# Patient Record
Sex: Female | Born: 1945
Health system: Southern US, Community
[De-identification: ages and names within clinical notes are randomized; demographics above are authoritative.]

## PROBLEM LIST (undated history)

## (undated) DIAGNOSIS — K589 Irritable bowel syndrome without diarrhea: Secondary | ICD-10-CM

## (undated) DIAGNOSIS — M1990 Unspecified osteoarthritis, unspecified site: Secondary | ICD-10-CM

## (undated) DIAGNOSIS — K5790 Diverticulosis of intestine, part unspecified, without perforation or abscess without bleeding: Secondary | ICD-10-CM

## (undated) DIAGNOSIS — Z87442 Personal history of urinary calculi: Secondary | ICD-10-CM

## (undated) DIAGNOSIS — I8390 Asymptomatic varicose veins of unspecified lower extremity: Secondary | ICD-10-CM

## (undated) DIAGNOSIS — Z9889 Other specified postprocedural states: Secondary | ICD-10-CM

## (undated) DIAGNOSIS — D649 Anemia, unspecified: Secondary | ICD-10-CM

## (undated) DIAGNOSIS — R112 Nausea with vomiting, unspecified: Secondary | ICD-10-CM

## (undated) DIAGNOSIS — H269 Unspecified cataract: Secondary | ICD-10-CM

## (undated) DIAGNOSIS — M25619 Stiffness of unspecified shoulder, not elsewhere classified: Secondary | ICD-10-CM

## (undated) DIAGNOSIS — K635 Polyp of colon: Secondary | ICD-10-CM

## (undated) DIAGNOSIS — I10 Essential (primary) hypertension: Secondary | ICD-10-CM

## (undated) HISTORY — DX: Polyp of colon: K63.5

## (undated) HISTORY — PX: EYE SURGERY: SHX253

## (undated) HISTORY — DX: Diverticulosis of intestine, part unspecified, without perforation or abscess without bleeding: K57.90

## (undated) HISTORY — PX: JOINT REPLACEMENT: SHX530

## (undated) HISTORY — DX: Unspecified cataract: H26.9

## (undated) HISTORY — PX: FOOT SURGERY: SHX648

---

## 1999-06-08 ENCOUNTER — Other Ambulatory Visit: Admission: RE | Admit: 1999-06-08 | Discharge: 1999-06-08 | Payer: Self-pay | Admitting: Obstetrics & Gynecology

## 2001-06-06 ENCOUNTER — Encounter: Payer: Self-pay | Admitting: *Deleted

## 2001-06-06 ENCOUNTER — Emergency Department (HOSPITAL_COMMUNITY): Admission: EM | Admit: 2001-06-06 | Discharge: 2001-06-06 | Payer: Self-pay | Admitting: *Deleted

## 2001-06-23 ENCOUNTER — Ambulatory Visit (HOSPITAL_COMMUNITY): Admission: RE | Admit: 2001-06-23 | Discharge: 2001-06-23 | Payer: Self-pay | Admitting: Pulmonary Disease

## 2003-07-12 ENCOUNTER — Other Ambulatory Visit: Admission: RE | Admit: 2003-07-12 | Discharge: 2003-07-12 | Payer: Self-pay | Admitting: Obstetrics & Gynecology

## 2003-12-09 ENCOUNTER — Ambulatory Visit (HOSPITAL_COMMUNITY): Admission: RE | Admit: 2003-12-09 | Discharge: 2003-12-09 | Payer: Self-pay | Admitting: Internal Medicine

## 2003-12-09 LAB — HM COLONOSCOPY

## 2004-09-25 ENCOUNTER — Ambulatory Visit (HOSPITAL_COMMUNITY): Admission: RE | Admit: 2004-09-25 | Discharge: 2004-09-25 | Payer: Self-pay | Admitting: Preventative Medicine

## 2004-09-27 ENCOUNTER — Encounter: Admission: RE | Admit: 2004-09-27 | Discharge: 2004-09-27 | Payer: Self-pay | Admitting: Preventative Medicine

## 2004-10-19 ENCOUNTER — Encounter: Admission: RE | Admit: 2004-10-19 | Discharge: 2005-01-17 | Payer: Self-pay | Admitting: Neurology

## 2005-12-11 ENCOUNTER — Ambulatory Visit (HOSPITAL_COMMUNITY): Admission: RE | Admit: 2005-12-11 | Discharge: 2005-12-11 | Payer: Self-pay | Admitting: Pulmonary Disease

## 2005-12-16 ENCOUNTER — Emergency Department (HOSPITAL_COMMUNITY): Admission: EM | Admit: 2005-12-16 | Discharge: 2005-12-16 | Payer: Self-pay | Admitting: Emergency Medicine

## 2008-12-09 ENCOUNTER — Telehealth (INDEPENDENT_AMBULATORY_CARE_PROVIDER_SITE_OTHER): Payer: Self-pay | Admitting: *Deleted

## 2010-02-08 ENCOUNTER — Encounter (INDEPENDENT_AMBULATORY_CARE_PROVIDER_SITE_OTHER): Payer: Self-pay | Admitting: Obstetrics and Gynecology

## 2010-02-08 ENCOUNTER — Inpatient Hospital Stay (HOSPITAL_COMMUNITY): Admission: RE | Admit: 2010-02-08 | Discharge: 2010-02-11 | Payer: Self-pay | Admitting: Obstetrics and Gynecology

## 2010-02-09 HISTORY — PX: ABDOMINAL HYSTERECTOMY: SHX81

## 2010-11-20 LAB — URINALYSIS, ROUTINE W REFLEX MICROSCOPIC
Bilirubin Urine: NEGATIVE
Ketones, ur: NEGATIVE mg/dL
Nitrite: NEGATIVE
Specific Gravity, Urine: >1.03 — ABNORMAL HIGH (ref 1.005–1.030)

## 2010-11-20 LAB — PREGNANCY, URINE: Preg Test, Ur: NEGATIVE

## 2010-11-20 LAB — COMPREHENSIVE METABOLIC PANEL
ALT: 15 U/L (ref 0–35)
AST: 18 U/L (ref 0–37)
Alkaline Phosphatase: 78 U/L (ref 39–117)
BUN: 15 mg/dL (ref 6–23)
CO2: 29 mEq/L (ref 19–32)
Creatinine, Ser: 0.7 mg/dL (ref 0.4–1.2)
Glucose, Bld: 91 mg/dL (ref 70–99)
Potassium: 3.6 mEq/L (ref 3.5–5.1)

## 2010-11-20 LAB — PROTIME-INR: Prothrombin Time: 13.9 seconds (ref 11.6–15.2)

## 2010-11-20 LAB — CBC
Hemoglobin: 11 g/dL — ABNORMAL LOW (ref 12.0–15.0)
MCHC: 34.4 g/dL (ref 30.0–36.0)
Platelets: 220 10*3/uL (ref 150–400)
RBC: 4.61 MIL/uL (ref 3.87–5.11)
RDW: 14.7 % (ref 11.5–15.5)
RDW: 14.7 % (ref 11.5–15.5)
WBC: 6.1 10*3/uL (ref 4.0–10.5)

## 2010-11-20 LAB — HEMOGLOBIN AND HEMATOCRIT, BLOOD: HCT: 35.1 % — ABNORMAL LOW (ref 36.0–46.0)

## 2011-01-19 NOTE — Op Note (Signed)
NAMELACONYA, CLERE                          ACCOUNT NO.:  [id number]   MEDICAL RECORD NO.:  [id number]                   PATIENT TYPE:  AMB   LOCATION:  DAY                                  FACILITY:  [facility]   PHYSICIAN:  R. Roetta Sessions, M.D.              DATE OF BIRTH:  1946-02-26   DATE OF PROCEDURE:  DATE OF DISCHARGE:                                  PROCEDURE NOTE   INDICATION FOR PROCEDURE:  Pain.   HISTORY OF PRESENT ILLNESS:  This is a 65 year old lady who comes for  screening colonoscopy.  She has intermittent alternating constipation and  diarrhea consistent with irritable bowel syndrome.  No rectal bleeding.  She  has never had a complete colonoscopy although she had a sigmoidoscopy in  1998.  Colonoscopy is now being done.  The procedure was discussed with the  patient at risk, the potential risks, benefits and alternatives have been  reviewed, and questions answered.  She is agreeable.  Please see my dictated  H&P.   PROCEDURE NOTE:  O2 saturation, blood pressure, pulse, and respirations were  monitored throughout the entire procedure.  Conscious sedation with Versed 4  mg IV, and Demerol 75 mg IV in divided doses.   INSTRUMENT:  Instrument was Olympus CF-140 colonoscope.   FINDINGS:  Digital rectal exam revealed no abnormalities.   ENDOSCOPIC FINDINGS:  Prep was good.   RECTUM:  Examination of rectal mucosa, including retroflexed view of the  anal verge, revealed two anal internal hemorrhoids; otherwise, rectal mucosa  appeared normal.   COLON:  Colonic mucosa was surveyed from the rectosigmoid junction to the  left transverse right colon to the area of the appendiceal orifice,  ileocecal valve and cecum.  These structures were well seen and photographed  for the record.  From ___________,  the scope was slowly withdrawn.  All  previously mentioned mucosal surfaces were again seen.   The patient was noted to have sigmoid diverticula and a 0.7 cm  pedunculated  polyp at the splenic flexure, which was cold snared and recovered through  the scope.  The remainder of the colonic mucosa appeared normal.   The patient tolerated the procedure well throughout the endoscopy.   IMPRESSION:  1. Anal papillae (2), and internal hemorrhoids; otherwise normal rectum.  2. Sigmoid diverticula, pedunculated polyp at the splenic flexure, cold     snare, remainder of the colonic mucosa appeared normal.   RECOMMENDATIONS:  1. No aspirin or arthritis medications for the next 10 days.  2. Diverticulosis literature provided to Ms. Effinger.  3. She should begin a fiber supplement in the way of Fiber Choice or     Benefiber daily.  4. Further recommendations pending review of pathology.      ___________________________________________  Jonathon Bellows, M.D.   RMR/MEDQ  D:  12/09/2003  T:  12/10/2003  Job:  [id number]   cc:   Ramon Dredge L. Juanetta Gosling, M.D.  [address]  [place]  [place] [address]  Fax: [phone]   Annamaria Helling, Dr.  OB/GYN, [place]

## 2011-01-19 NOTE — H&P (Signed)
NAME:  Carla Schmitt, Carla Schmitt NO.:  [id number]   MEDICAL RECORD NO.:  [id number]                  PATIENT TYPE:   LOCATION:                                       FACILITY:   PHYSICIAN:  R. Roetta Sessions, M.D.              DATE OF BIRTH:  1946/08/22   DATE OF ADMISSION:  DATE OF DISCHARGE:                                HISTORY & PHYSICAL   CHIEF COMPLAINT:  Long history of irritable bowel syndrome, for screening  colonoscopy.   Mrs. Carla Schmitt is a pleasant 65 year old African-American female whom I  last saw back in 1998.  She was having diarrhea-predominant IBS symptoms.  Sigmoidoscopy back in 1998 demonstrated internal hemorrhoids of the sigmoid  ampulla, otherwise normal colon to 60 cm.  She has done very well.  She has  been on a diet under the direction of Dr. Aldona Bar, her gynecologist.  She has  lost 27 pounds recently.  She feels that she needs to have a colonoscopy.  She has not had any rectal bleeding although she has had a tendency towards  constipation in the past couple of years.  No abdominal pain aside from  vague postprandial abdominal cramps from time to time.  No upper GI tract  symptoms such as odynophagia, dysphagia, early satiety, reflux symptoms,  nausea, or vomiting.  There is no family history of colorectal neoplasia.  She was previously on Levbid for diarrhea-predominant symptoms; however, she  has come off that medication some time ago.   PAST MEDICAL HISTORY:  Significant for irritable bowel syndrome,  constipation, fluid retention.   PAST SURGERY:  Right and left foot surgery.   CURRENT MEDICATIONS:  Maxzide two tablets weekly for fluid retention.   ALLERGIES:  VIBRAMYCIN.   FAMILY HISTORY:  No for chronic GI or liver disease.  Father died with  congestive heart failure.  Mother is alive in fairly good health.   SOCIAL HISTORY:  The patient has been married 22 years, has two children.  She is a housewife.  No tobacco, no  alcohol.   REVIEW OF SYSTEMS:  No chest pain, dyspnea, fever, chills.   PHYSICAL EXAMINATION:  GENERAL:  Reveals a pleasant, well-groomed 57-year-  old lady resting comfortably.  VITAL SIGNS:  Weight 226, height 5 feet 3.5 inches.  Blood pressure 130/80,  pulse 84.  SKIN:  Warm and dry;  HEENT:  No scleral icterus.  CHEST:  Lungs are clear to auscultation.  CARDIAC:  Regular rate and rhythm without murmur, gallop, or rub.  BREASTS:  Deferred.  ABDOMEN:  Obese, positive bowel sounds, soft, nontender, without appreciable  mass or organomegaly.  EXTREMITIES:  Trace lower extremity edema.  RECTAL:  Deferred until the time of colonoscopy.   IMPRESSION:  Mrs. Carla Schmitt is a pleasant 65 year old lady with  alternating constipation and diarrhea consistent with irritable bowel  syndrome.  She has never had a full colonoscopy although she had a negative  flexible sigmoidoscopy  to 60 cm in 1998.  I have agreed with Mrs. Carla Schmitt a  colonoscopy should be done largely for screening purposes.  I have discussed  this approach with Mrs. Carla Schmitt.  Potential risks, benefits, and  alternatives have been reviewed, questions answered, she is agreeable.  Plan  to perform colonoscopy in the very near future.  I have also recommended to  her that she start taking a fiber supplement on a daily basis.  I have  recommended specifically Fiber Choice tablets or Benefiber.  Will make  further recommendations after colonoscopy has been performed.     ___________________________________________                                         Jonathon Bellows, M.D.   RMR/MEDQ  D:  12/06/2003  T:  12/06/2003  Job:  [id number]   cc:   Gerrit Friends. Aldona Bar, M.D.  [address], [address]  [place]  [place] [address]  Fax: [phone]   Oneal Deputy. Juanetta Gosling, M.D.  [address]  [place]  [place] [address]  Fax: [phone]

## 2011-02-02 LAB — HM MAMMOGRAPHY

## 2011-02-02 LAB — HM PAP SMEAR

## 2011-06-04 ENCOUNTER — Encounter: Payer: Self-pay | Admitting: Family Medicine

## 2011-06-04 ENCOUNTER — Ambulatory Visit (INDEPENDENT_AMBULATORY_CARE_PROVIDER_SITE_OTHER): Payer: 59 | Admitting: Family Medicine

## 2011-06-04 VITALS — BP 122/92 | HR 80 | Temp 98.7°F | Resp 12 | Ht 63.0 in | Wt 240.0 lb

## 2011-06-04 DIAGNOSIS — E669 Obesity, unspecified: Secondary | ICD-10-CM

## 2011-06-04 DIAGNOSIS — IMO0002 Reserved for concepts with insufficient information to code with codable children: Secondary | ICD-10-CM

## 2011-06-04 DIAGNOSIS — Z23 Encounter for immunization: Secondary | ICD-10-CM

## 2011-06-04 DIAGNOSIS — M1710 Unilateral primary osteoarthritis, unspecified knee: Secondary | ICD-10-CM

## 2011-06-04 MED ORDER — NAPROXEN 500 MG PO TABS: 500.0000 mg | ORAL_TABLET | Freq: Two times a day (BID) | ORAL | Status: DC

## 2011-06-04 NOTE — Progress Notes (Signed)
  Subjective:    Patient ID: Carla Schmitt, female    DOB: May 09, 1946, 65 y.o.   MRN: [id number]  HPI Patient is seen new to establish care.  Has previously seen primary care physician in [place]. She has history of seasonal allergies, obesity, and osteoarthritis involving both knees. She's been told previously several years ago that she had bone against bone osteoarthritis and would need total knee replacements.  Very limited in physical activity because of arthritis issues. Requesting refill of naproxen 500 mg which she takes occasionally for pain.  Hx of seasonal allergies controlled with OTC meds.   Obesity but no hx of diabetes.   Currently not on any weight loss programs.  Patient nonsmoker. No alcohol use.  Old records pending.  PMH and surgical hx as below.  Past Medical History  Diagnosis Date  . Anxiety   . Bronchitis   . Headache   . UTI (urinary tract infection)   . Colon polyps   . Chronic kidney disease     stones   Past Surgical History  Procedure Date  . Abdominal hysterectomy 2011    TAH  . Foot surgery     reports that she quit smoking about 20 years ago. Her smoking use included Cigarettes. She has a 18 pack-year smoking history. She does not have any smokeless tobacco history on file. Her alcohol and drug histories not on file. family history includes Arthritis in her other; Heart disease in her other; and Stroke in her other. Allergies  Allergen Reactions  . Ceftin     hives      Review of Systems  Constitutional: Negative for fever, appetite change and unexpected weight change.  HENT: Negative for congestion and sinus pressure.   Respiratory: Negative for cough and shortness of breath.   Cardiovascular: Negative for chest pain.  Gastrointestinal: Negative for abdominal pain and blood in stool.  Genitourinary: Negative for dysuria.  Musculoskeletal: Positive for arthralgias. Negative for back pain and joint swelling.  Neurological: Negative for  dizziness and headaches.  Hematological: Negative for adenopathy.  Psychiatric/Behavioral: Negative for dysphoric mood.       Objective:   Physical Exam  Constitutional: She is oriented to person, place, and time.  HENT:  Right Ear: External ear normal.  Left Ear: External ear normal.  Mouth/Throat: Oropharynx is clear and moist.  Neck: Neck supple. No thyromegaly present.  Cardiovascular: Normal rate, regular rhythm and normal heart sounds.   Pulmonary/Chest: Effort normal and breath sounds normal. No respiratory distress. She has no wheezes. She has no rales.  Musculoskeletal: She exhibits no edema.  Lymphadenopathy:    She has no cervical adenopathy.  Neurological: She is alert and oriented to person, place, and time. No cranial nerve deficit.  Psychiatric: She has a normal mood and affect. Her behavior is normal.          Assessment & Plan:  #1 morbid obesity. Discussed importance of weight loss. Discussed alternative exercises with osteoarthritis. Schedule complete physical #2 osteoarthritis both knees. Patient requesting orthopedic referral and we'll set up #3 history of seasonal allergies  #4 health maintenance. Flu vaccine recommended and given

## 2011-06-04 NOTE — Patient Instructions (Signed)
Schedule complete physical at your convenience.   

## 2011-08-14 ENCOUNTER — Other Ambulatory Visit: Payer: Self-pay | Admitting: Orthopedic Surgery

## 2011-09-20 ENCOUNTER — Other Ambulatory Visit (INDEPENDENT_AMBULATORY_CARE_PROVIDER_SITE_OTHER): Payer: 59

## 2011-09-20 DIAGNOSIS — Z0000 Encounter for general adult medical examination without abnormal findings: Secondary | ICD-10-CM

## 2011-09-20 LAB — CBC WITH DIFFERENTIAL/PLATELET
Basophils Absolute: 0 10*3/uL (ref 0.0–0.1)
Basophils Relative: 0.4 % (ref 0.0–3.0)
Eosinophils Absolute: 0.3 10*3/uL (ref 0.0–0.7)
Eosinophils Relative: 4.7 % (ref 0.0–5.0)
Lymphocytes Relative: 27 % (ref 12.0–46.0)
Lymphs Abs: 1.8 10*3/uL (ref 0.7–4.0)
MCV: 85.3 fl (ref 78.0–100.0)
Monocytes Relative: 4 % (ref 3.0–12.0)
Neutrophils Relative %: 63.9 % (ref 43.0–77.0)
Platelets: 265 10*3/uL (ref 150.0–400.0)
RBC: 4.38 Mil/uL (ref 3.87–5.11)
RDW: 15.1 % — ABNORMAL HIGH (ref 11.5–14.6)

## 2011-09-20 LAB — POCT URINALYSIS DIPSTICK
Protein, UA: NEGATIVE
Urobilinogen, UA: 0.2
pH, UA: 5

## 2011-09-20 LAB — HEPATIC FUNCTION PANEL
Alkaline Phosphatase: 72 U/L (ref 39–117)
Total Bilirubin: 0.3 mg/dL (ref 0.3–1.2)
Total Protein: 7.3 g/dL (ref 6.0–8.3)

## 2011-09-20 LAB — LIPID PANEL
HDL: 60.9 mg/dL (ref >39.00)
LDL Cholesterol: 92 mg/dL (ref 0–99)
VLDL: 23.2 mg/dL (ref 0.0–40.0)

## 2011-09-20 LAB — BASIC METABOLIC PANEL
BUN: 24 mg/dL — ABNORMAL HIGH (ref 6–23)
GFR: 108.51 mL/min (ref >60.00)
Potassium: 3.5 mEq/L (ref 3.5–5.1)

## 2011-09-27 ENCOUNTER — Ambulatory Visit (INDEPENDENT_AMBULATORY_CARE_PROVIDER_SITE_OTHER): Payer: 59 | Admitting: Family Medicine

## 2011-09-27 ENCOUNTER — Encounter: Payer: Self-pay | Admitting: Family Medicine

## 2011-09-27 VITALS — BP 160/84 | HR 80 | Temp 98.4°F | Resp 12 | Ht 63.0 in | Wt 238.0 lb

## 2011-09-27 DIAGNOSIS — I10 Essential (primary) hypertension: Secondary | ICD-10-CM

## 2011-09-27 DIAGNOSIS — Z0000 Encounter for general adult medical examination without abnormal findings: Secondary | ICD-10-CM

## 2011-09-27 MED ORDER — BECLOMETHASONE DIPROP MONOHYD 42 MCG/SPRAY NA SUSP: 2.0000 | Freq: Two times a day (BID) | NASAL | Status: DC

## 2011-09-27 MED ORDER — HYDROCHLOROTHIAZIDE 25 MG PO TABS: ORAL_TABLET | ORAL | Status: DC

## 2011-09-27 NOTE — Patient Instructions (Signed)

## 2011-09-27 NOTE — Progress Notes (Signed)
Subjective:    Patient ID: Carla Schmitt, female    DOB: 10/07/1945, 66 y.o.   MRN: [id number]  HPI  Patient here for complete physical. Past medical history reviewed. Osteoarthritis mostly involving knees. Planning for total knee replacement in March. She has history of seasonal allergies and takes nasal steroids for that. No other prescription medications. Colonoscopy but unsure of date. This was done approximately 10 years ago. She had total abdominal hysterectomy secondary to uterine prolapse. Continues to see gynecologist for mammograms and Pap smears. Last tetanus less than 10 years ago.  History of elevated blood pressure off and on but never treated.  No recent chest pains.  No edema. Past Medical History  Diagnosis Date  . Anxiety   . Bronchitis   . Headache   . UTI (urinary tract infection)   . Colon polyps   . Chronic kidney disease     stones   Past Surgical History  Procedure Date  . Abdominal hysterectomy 2011    TAH  . Foot surgery     reports that she quit smoking about 20 years ago. Her smoking use included Cigarettes. She has a 18 pack-year smoking history. She does not have any smokeless tobacco history on file. Her alcohol and drug histories not on file. family history includes Arthritis in her other; Heart disease in her other; and Stroke in her other. Allergies  Allergen Reactions  . Cefuroxime Axetil     hives      Review of Systems  Constitutional: Negative for fever, activity change, appetite change, fatigue and unexpected weight change.  HENT: Negative for hearing loss, ear pain, sore throat and trouble swallowing.   Eyes: Negative for visual disturbance.  Respiratory: Negative for cough and shortness of breath.   Cardiovascular: Negative for chest pain and palpitations.  Gastrointestinal: Negative for abdominal pain, diarrhea, constipation and blood in stool.  Genitourinary: Negative for dysuria and hematuria.  Musculoskeletal: Positive for  arthralgias (Knees). Negative for myalgias and back pain.  Skin: Negative for rash.  Neurological: Negative for dizziness, syncope and headaches.  Hematological: Negative for adenopathy.  Psychiatric/Behavioral: Negative for confusion and dysphoric mood.       Objective:   Physical Exam  Constitutional: She is oriented to person, place, and time. She appears well-developed and well-nourished.  HENT:  Head: Normocephalic and atraumatic.  Eyes: EOM are normal. Pupils are equal, round, and reactive to light.  Neck: Normal range of motion. Neck supple. No thyromegaly present.  Cardiovascular: Normal rate, regular rhythm and normal heart sounds.   No murmur heard. Pulmonary/Chest: Breath sounds normal. No respiratory distress. She has no wheezes. She has no rales.  Abdominal: Soft. Bowel sounds are normal. She exhibits no distension and no mass. There is no tenderness. There is no rebound and no guarding.  Musculoskeletal: Normal range of motion. She exhibits no edema.  Lymphadenopathy:    She has no cervical adenopathy.  Neurological: She is alert and oriented to person, place, and time. She displays normal reflexes. No cranial nerve deficit.  Skin: No rash noted.  Psychiatric: She has a normal mood and affect. Her behavior is normal. Judgment and thought content normal.          Assessment & Plan:  #1 health maintenance. Confirm date of last colonoscopy. Check on insurance coverage for shingles vaccine. Confirm date of last tetanus. Continue regular GYN followup. Labs reviewed with patient. No major concerning abnormalities #2 hypertension currently untreated. Elevated reading today repeat at rest left  arm seated 180/96. Initiate HCTZ 12.5 mg daily and reassess blood pressure in 2 weeks

## 2011-09-28 DIAGNOSIS — I10 Essential (primary) hypertension: Secondary | ICD-10-CM | POA: Insufficient documentation

## 2011-10-11 ENCOUNTER — Encounter: Payer: Self-pay | Admitting: Family Medicine

## 2011-10-11 ENCOUNTER — Ambulatory Visit (INDEPENDENT_AMBULATORY_CARE_PROVIDER_SITE_OTHER): Payer: 59 | Admitting: Family Medicine

## 2011-10-11 VITALS — BP 170/98 | Temp 98.1°F | Wt 238.0 lb

## 2011-10-11 DIAGNOSIS — I10 Essential (primary) hypertension: Secondary | ICD-10-CM

## 2011-10-11 DIAGNOSIS — Z299 Encounter for prophylactic measures, unspecified: Secondary | ICD-10-CM

## 2011-10-11 MED ORDER — TETANUS-DIPHTH-ACELL PERTUSSIS 5-2.5-18.5 LF-MCG/0.5 IM SUSP: 0.5000 mL | Freq: Once | INTRAMUSCULAR | Status: DC

## 2011-10-11 MED ORDER — ZOSTER VACCINE LIVE 19400 UNT/0.65ML ~~LOC~~ SOLR: 0.6500 mL | Freq: Once | SUBCUTANEOUS | Status: DC

## 2011-10-11 MED ORDER — AMLODIPINE BESYLATE 5 MG PO TABS: 5.0000 mg | ORAL_TABLET | Freq: Every day | ORAL | Status: DC

## 2011-10-11 NOTE — Progress Notes (Signed)
  Subjective:    Patient ID: Carla Schmitt, female    DOB: 1946/06/06, 66 y.o.   MRN: [id number]  HPI  Patient seen for followup hypertension. Initiated HCTZ 12.5 mg daily last visit. No side effects. Not monitoring blood pressure at home. No peripheral edema issues. No dizziness. No chest pains. No headaches. Compliant with therapy. She is recently tried to reduce salt intake   Review of Systems  Constitutional: Negative for fatigue.  Eyes: Negative for visual disturbance.  Respiratory: Negative for cough, chest tightness, shortness of breath and wheezing.   Cardiovascular: Negative for chest pain, palpitations and leg swelling.  Neurological: Negative for dizziness, seizures, syncope, weakness, light-headedness and headaches.       Objective:   Physical Exam  Constitutional: She appears well-developed and well-nourished.  Cardiovascular: Normal rate and regular rhythm.   Pulmonary/Chest: Effort normal and breath sounds normal. No respiratory distress. She has no wheezes. She has no rales.  Musculoskeletal: She exhibits no edema.          Assessment & Plan:  Hypertension. Still elevated fairly severe range with repeat right and left arm seated after rest 170/98. Add amlodipine 5 mg daily continue HCTZ. Reassess 3 weeks prior to surgery

## 2011-10-26 ENCOUNTER — Encounter (HOSPITAL_COMMUNITY): Payer: Self-pay | Admitting: Pharmacy Technician

## 2011-10-29 ENCOUNTER — Encounter (HOSPITAL_COMMUNITY): Payer: Self-pay

## 2011-10-29 ENCOUNTER — Ambulatory Visit (HOSPITAL_COMMUNITY)
Admission: RE | Admit: 2011-10-29 | Discharge: 2011-10-29 | Disposition: A | Discharge status: Home / Self Care | Payer: 59 | Source: Ambulatory Visit | Attending: Orthopedic Surgery | Admitting: Orthopedic Surgery

## 2011-10-29 ENCOUNTER — Encounter (HOSPITAL_COMMUNITY)
Admission: RE | Admit: 2011-10-29 | Discharge: 2011-10-29 | Disposition: A | Discharge status: Home / Self Care | Payer: 59 | Source: Ambulatory Visit | Attending: Orthopedic Surgery | Admitting: Orthopedic Surgery

## 2011-10-29 ENCOUNTER — Other Ambulatory Visit: Payer: Self-pay

## 2011-10-29 DIAGNOSIS — Z01810 Encounter for preprocedural cardiovascular examination: Secondary | ICD-10-CM | POA: Insufficient documentation

## 2011-10-29 DIAGNOSIS — I10 Essential (primary) hypertension: Secondary | ICD-10-CM | POA: Insufficient documentation

## 2011-10-29 DIAGNOSIS — M5380 Other specified dorsopathies, site unspecified: Secondary | ICD-10-CM | POA: Insufficient documentation

## 2011-10-29 DIAGNOSIS — M1710 Unilateral primary osteoarthritis, unspecified knee: Secondary | ICD-10-CM | POA: Insufficient documentation

## 2011-10-29 DIAGNOSIS — Z01812 Encounter for preprocedural laboratory examination: Secondary | ICD-10-CM | POA: Insufficient documentation

## 2011-10-29 DIAGNOSIS — Z01818 Encounter for other preprocedural examination: Secondary | ICD-10-CM | POA: Insufficient documentation

## 2011-10-29 HISTORY — DX: Essential (primary) hypertension: I10

## 2011-10-29 HISTORY — DX: Asymptomatic varicose veins of unspecified lower extremity: I83.90

## 2011-10-29 HISTORY — DX: Unspecified osteoarthritis, unspecified site: M19.90

## 2011-10-29 HISTORY — DX: Irritable bowel syndrome, unspecified: K58.9

## 2011-10-29 LAB — CBC
HCT: 36.8 % (ref 36.0–46.0)
MCV: 83.1 fL (ref 78.0–100.0)
RDW: 14.7 % (ref 11.5–15.5)
WBC: 7 10*3/uL (ref 4.0–10.5)

## 2011-10-29 LAB — COMPREHENSIVE METABOLIC PANEL
BUN: 17 mg/dL (ref 6–23)
CO2: 30 mEq/L (ref 19–32)
Chloride: 103 mEq/L (ref 96–112)
Creatinine, Ser: 0.66 mg/dL (ref 0.50–1.10)
GFR calc Af Amer: >90 mL/min (ref >90)
GFR calc non Af Amer: >90 mL/min (ref >90)
Total Bilirubin: 0.4 mg/dL (ref 0.3–1.2)

## 2011-10-29 LAB — URINALYSIS, ROUTINE W REFLEX MICROSCOPIC
Bilirubin Urine: NEGATIVE
Ketones, ur: NEGATIVE mg/dL
Nitrite: NEGATIVE
Urobilinogen, UA: 0.2 mg/dL (ref 0.0–1.0)

## 2011-10-29 LAB — PROTIME-INR
INR: 1.03 (ref 0.00–1.49)
Prothrombin Time: 13.7 seconds (ref 11.6–15.2)

## 2011-10-29 NOTE — Patient Instructions (Signed)
20 Carla Schmitt  10/29/2011   Your procedure is scheduled on:  11-05-11  Report to [facility] at  0730 AM.  Call this number if you have problems the morning of surgery: [phone]   Remember:   Do not eat food or drink liquids:After Midnight.  .  Take these medicines the morning of surgery with A SIP OF WATER: amlodipine, beconase nasal spray, systane balance eye drop if needed, [facility]vs nasal mist spray if needed   Do not wear jewelry or make up.  Do not wear lotions, powders, or perfumes.Do not wear deodorant.    Do not bring valuables to the hospital.  Contacts, dentures or bridgework may not be worn into surgery.  Leave suitcase in the car. After surgery it may be brought to your room.  For patients admitted to the hospital, checkout time is 11:00 AM the day of discharge.     Special Instructions: CHG Shower Use Special Wash: 1/2 bottle night before surgery and 1/2 bottle morning of surgery.neck down avoid private area, do niot shave for 2 days before showers   Please read over the following fact sheets that you were given: MRSA Information, blood fact sheet  [facility] pre op nurse phone number [phone], call if needed

## 2011-10-31 ENCOUNTER — Other Ambulatory Visit: Payer: Self-pay | Admitting: Orthopedic Surgery

## 2011-11-01 ENCOUNTER — Encounter: Payer: Self-pay | Admitting: Family Medicine

## 2011-11-01 ENCOUNTER — Ambulatory Visit (INDEPENDENT_AMBULATORY_CARE_PROVIDER_SITE_OTHER): Payer: 59 | Admitting: Family Medicine

## 2011-11-01 VITALS — BP 150/82 | Temp 98.2°F | Wt 235.0 lb

## 2011-11-01 DIAGNOSIS — I10 Essential (primary) hypertension: Secondary | ICD-10-CM

## 2011-11-01 NOTE — Progress Notes (Signed)
  Subjective:    Patient ID: Carla Schmitt, female    DOB: March 03, 1946, 66 y.o.   MRN: [id number]  HPI  Patient seen for followup hypertension. Scheduled for knee surgery fourth of March. Added amlodipine last visit to her HCTZ. Blood pressure has improved. BP 170 systolic last visit today 150. No side effects medication. Compliant with medication.   Review of Systems  Constitutional: Negative for fatigue.  Eyes: Negative for visual disturbance.  Respiratory: Negative for cough, chest tightness, shortness of breath and wheezing.   Cardiovascular: Negative for chest pain, palpitations and leg swelling.  Neurological: Negative for dizziness, seizures, syncope, weakness, light-headedness and headaches.       Objective:   Physical Exam  Constitutional: She appears well-developed and well-nourished.  Cardiovascular: Normal rate and regular rhythm.   Pulmonary/Chest: Effort normal and breath sounds normal. No respiratory distress. She has no wheezes. She has no rales.  Musculoskeletal: She exhibits no edema.          Assessment & Plan:  Hypertension. Improved. Continue close monitoring. Patient will schedule followup in 2 months to reassess after her surgery. Consider titration of amlodipine at that point if not further to goal

## 2011-11-01 NOTE — Patient Instructions (Signed)
Continue weight loss efforts.

## 2011-11-01 NOTE — Pre-Procedure Instructions (Signed)
Medical clearance note dr Caryl Never 11-01-11 on chart

## 2011-11-04 ENCOUNTER — Other Ambulatory Visit: Payer: Self-pay | Admitting: Orthopedic Surgery

## 2011-11-04 NOTE — H&P (Signed)
Carla Schmitt  DOB: Sep 24, 1945 Married / Language: English / Race: Black or African American / Female  Date of Admission: 11/05/2011  Chief Complaint:  Left Knee Pain  History of Present Illness  The patient is a 66 year old female who comes in  for a preoperative History and Physical. The patient is scheduled for a left total knee arthroplasty to be performed by Dr. Gus Minkoff. Aluisio, MD at [facility] on 11/05/2011. The patient is a 66 year old female who presents with knee complaints. The patient is seen in referral from Dr. Evelena Peat. The patient reports left knee and right knee symptoms including: pain, swelling, instability, giving way and stiffness which began 30 year(s) ago without any known injury. Carla Schmitt states that both knees hurt at all times. The left knee is somewhat more symptomatic than the right. She has had problems in the knee for many years and it has gotten progressively worse in the past couple of years. She is at a stage where the knees have essentially taken over everything she can and can not do. She has had injections in the past without much benefit. She has had cortisone but no visco supplements. She can no longer walk. The knees are hurting her day and night. They are affecting her ability to do any activities of daily living. She would like to proceed with surgery. They have been treated conservatively in the past for the above stated problem and despite conservative measures, they continue to have progressive pain and severe functional limitations and dysfunction. They have failed non-operative management. It is felt that they would benefit from undergoing total joint replacement. Risks and benefits of the procedure have been discussed with the patient and they elect to proceed with surgery. There are no active contraindications to surgery such as ongoing infection or rapidly progressive neurological disease.  Allergies  Ceftin  *CEPHALOSPORINS*. Rash.  Problem List/Past Medical  Varicose veins Irritable bowel syndrome Osteoarthritis, Knee (715.96)  Family History  Father. Deceased, Congestive Heart Failure, Hypertension. age 80's Mother. Deceased. age 26, History of Pneumonia  Social History  Marital status. Married. Tobacco use. Former smoker. Alcohol use. Never consumed alcohol. Living situation. Lives with spouse. Post-Surgical Plans. Plan to go home  Medication History  Naproxen (500MG  Tablet, Oral as needed) Active. Beconase AQ (42MCG/SPRAY Suspension, Nasal) Active. Gabapentin ( Oral) Specific dose unknown - Active. Vitamin D ( Oral) Specific dose unknown - Active.  Past Surgical History  Hysterectomy. Date: 02/2010. partial (non-cancerous) Foot Surgery. bilateral Colon Polyp Removal - Colonoscopy  Review of Systems  General:Present- Night Sweats. Not Present- Chills, Fever, Fatigue, Weight Gain, Weight Loss and Memory Loss. Skin:Not Present- Hives, Itching, Rash, Eczema and Lesions. HEENT:Present- Tinnitus. Not Present- Headache, Double Vision, Visual Loss, Hearing Loss and Dentures. Respiratory:Not Present- Shortness of breath with exertion, Shortness of breath at rest, Allergies, Coughing up blood and Chronic Cough. Cardiovascular:Not Present- Chest Pain, Racing/skipping heartbeats, Difficulty Breathing Lying Down, Murmur, Swelling and Palpitations. Gastrointestinal:Not Present- Bloody Stool, Heartburn, Abdominal Pain, Vomiting, Nausea, Constipation, Diarrhea, Difficulty Swallowing, Jaundice and Loss of appetitie. Female Genitourinary:Present- Urinating at Night. Not Present- Blood in Urine, Urinary frequency, Weak urinary stream, Discharge, Flank Pain, Incontinence, Painful Urination, Urgency and Urinary Retention. Musculoskeletal:Present- Muscle Weakness, Muscle Pain, Joint Swelling, Joint Pain and Morning Stiffness. Not Present- Back Pain and Spasms. Neurological:Not  Present- Tremor, Dizziness, Blackout spells, Paralysis, Difficulty with balance and Weakness. Psychiatric:Not Present- Insomnia.  Vitals Weight: 63.5 lb Height: 239 in Body Surface Area:  2.2 m Body Mass Index: 0.78 kg/m Pulse: 68 (Regular) Resp.: 14 (Unlabored) BP: 128/82 (Sitting, Left Arm, Standard)   Physical Exam The physical exam findings are as follows:  Patient is a 66 year old female with continued left knee pain.  General Mental Status - Alert, cooperative and good historian. General Appearance- pleasant. Not in acute distress. Orientation- Oriented X3. Build & Nutrition- Overweight, Obese (hip and thigh) and Well developed.  Head and Neck Head- normocephalic, atraumatic . Neck Global Assessment- supple. no bruit auscultated on the right and no bruit auscultated on the left.  ENT Pupil- Bilateral- Regular and Round. Note: reading glasses at times Motion- Bilateral- EOMI.  Upper and lower dentures  Chest and Lung Exam Auscultation: Breath sounds:- clear at anterior chest wall and - clear at posterior chest wall. Adventitious sounds:- No Adventitious sounds.  Cardiovascular Auscultation:Rhythm- Regular rate and rhythm. Heart Sounds- S1 WNL and S2 WNL. Murmurs & Other Heart Sounds:Auscultation of the heart reveals - No Murmurs.  Abdomen Palpation/Percussion:Tenderness- Abdomen is non-tender to palpation. Rigidity (guarding)- Abdomen is soft. Auscultation:Auscultation of the abdomen reveals - Bowel sounds normal.  Female Genitourinary Not done, not pertinent to present illness  Musculoskeletal Well developed female alert and oriented in no apparent distress. Evaluation of her hips show normal range of motion with no discomfort. Her left knee shows no effusion. Range is about 10 to 110. There is marked crepitus on range of motion of the knee. She is tender medial greater than lateral. There is no instability noted. Right  knee range 5 to 110, moderate crepitus on range of motion. Tender medial greater than lateral with no instability noted. Pulses are intact distally with intact sensation and motor function.  RADIOGRAPHS: AP both knees and lateral show severe advanced endstage arthritis of both knees. She has severe bone on bone all three compartments worse medial and patellofemoral with significant varus deformity, significant bony erosions and tibial subluxation with cyst formation.  Assessment & Plan  Osteoarthritis Left Knee  Patient is for a Left Total Knee Replacement by Dr. Lequita Halt.  Plan is to go home after the hospital stay.  PCP- Dr. Smitty Cords Burchett GYN - Dr. Penelope Galas, PA-C

## 2011-11-05 ENCOUNTER — Inpatient Hospital Stay (HOSPITAL_COMMUNITY)
Admission: RE | Admit: 2011-11-05 | Discharge: 2011-11-08 | DRG: 470 | Disposition: A | Discharge status: Home Health | Payer: 59 | Source: Ambulatory Visit | Attending: Orthopedic Surgery | Admitting: Orthopedic Surgery

## 2011-11-05 ENCOUNTER — Encounter (HOSPITAL_COMMUNITY)
Admission: RE | Disposition: A | Discharge status: Home Health | Payer: Self-pay | Source: Ambulatory Visit | Attending: Orthopedic Surgery

## 2011-11-05 ENCOUNTER — Inpatient Hospital Stay (HOSPITAL_COMMUNITY): Payer: 59 | Admitting: *Deleted

## 2011-11-05 ENCOUNTER — Encounter (HOSPITAL_COMMUNITY): Payer: Self-pay | Admitting: *Deleted

## 2011-11-05 DIAGNOSIS — E876 Hypokalemia: Secondary | ICD-10-CM | POA: Diagnosis not present

## 2011-11-05 DIAGNOSIS — I10 Essential (primary) hypertension: Secondary | ICD-10-CM | POA: Diagnosis present

## 2011-11-05 DIAGNOSIS — M1710 Unilateral primary osteoarthritis, unspecified knee: Principal | ICD-10-CM | POA: Diagnosis present

## 2011-11-05 DIAGNOSIS — Z96659 Presence of unspecified artificial knee joint: Secondary | ICD-10-CM

## 2011-11-05 DIAGNOSIS — D62 Acute posthemorrhagic anemia: Secondary | ICD-10-CM | POA: Diagnosis not present

## 2011-11-05 DIAGNOSIS — E871 Hypo-osmolality and hyponatremia: Secondary | ICD-10-CM | POA: Diagnosis not present

## 2011-11-05 DIAGNOSIS — F411 Generalized anxiety disorder: Secondary | ICD-10-CM | POA: Diagnosis present

## 2011-11-05 HISTORY — PX: TOTAL KNEE ARTHROPLASTY: SHX125

## 2011-11-05 LAB — ABO/RH: ABO/RH(D): O POS

## 2011-11-05 SURGERY — ARTHROPLASTY, KNEE, TOTAL
Anesthesia: Spinal | Site: Knee | Laterality: Left | Wound class: Clean

## 2011-11-05 MED ORDER — SODIUM CHLORIDE 0.9 % IV SOLN: INTRAVENOUS | Status: DC

## 2011-11-05 MED ORDER — PROPOFOL 10 MG/ML IV EMUL
INTRAVENOUS | Status: DC | PRN
  Administered 2011-11-05: 50 ug/kg/min via INTRAVENOUS

## 2011-11-05 MED ORDER — ACETAMINOPHEN 10 MG/ML IV SOLN
1000.0000 mg | Freq: Four times a day (QID) | INTRAVENOUS | Status: AC
  Administered 2011-11-05 – 2011-11-06 (×4): 1000 mg via INTRAVENOUS
  Filled 2011-11-05 (×5): qty 100

## 2011-11-05 MED ORDER — PROPYLENE GLYCOL 0.6 % OP SOLN: 2.0000 [drp] | Freq: Two times a day (BID) | OPHTHALMIC | Status: DC | PRN

## 2011-11-05 MED ORDER — MEPERIDINE HCL 50 MG/ML IJ SOLN: 6.2500 mg | INTRAMUSCULAR | Status: DC | PRN

## 2011-11-05 MED ORDER — METOCLOPRAMIDE HCL 10 MG PO TABS: 5.0000 mg | ORAL_TABLET | Freq: Three times a day (TID) | ORAL | Status: DC | PRN

## 2011-11-05 MED ORDER — ONDANSETRON HCL 4 MG/2ML IJ SOLN
4.0000 mg | Freq: Four times a day (QID) | INTRAMUSCULAR | Status: DC | PRN
  Filled 2011-11-05 (×2): qty 2

## 2011-11-05 MED ORDER — FLEET ENEMA 7-19 GM/118ML RE ENEM: 1.0000 | ENEMA | Freq: Once | RECTAL | Status: AC | PRN

## 2011-11-05 MED ORDER — FENTANYL CITRATE 0.05 MG/ML IJ SOLN
INTRAMUSCULAR | Status: DC | PRN
  Administered 2011-11-05: 100 ug via INTRAVENOUS

## 2011-11-05 MED ORDER — DIPHENHYDRAMINE HCL 50 MG/ML IJ SOLN: 12.5000 mg | Freq: Four times a day (QID) | INTRAMUSCULAR | Status: DC | PRN

## 2011-11-05 MED ORDER — MIDAZOLAM HCL 5 MG/5ML IJ SOLN
INTRAMUSCULAR | Status: DC | PRN
  Administered 2011-11-05: 2 mg via INTRAVENOUS

## 2011-11-05 MED ORDER — DIPHENHYDRAMINE HCL 12.5 MG/5ML PO ELIX: 12.5000 mg | ORAL_SOLUTION | ORAL | Status: DC | PRN

## 2011-11-05 MED ORDER — NALOXONE HCL 0.4 MG/ML IJ SOLN: 0.4000 mg | INTRAMUSCULAR | Status: DC | PRN

## 2011-11-05 MED ORDER — OXYMETAZOLINE HCL 0.05 % NA SOLN
2.0000 | NASAL | Status: DC | PRN
  Filled 2011-11-05: qty 15

## 2011-11-05 MED ORDER — VANCOMYCIN HCL 1000 MG IV SOLR
1500.0000 mg | INTRAVENOUS | Status: AC
  Administered 2011-11-05: 1500 mg via INTRAVENOUS
  Filled 2011-11-05: qty 1500

## 2011-11-05 MED ORDER — HYDROCHLOROTHIAZIDE 12.5 MG PO CAPS
12.5000 mg | ORAL_CAPSULE | Freq: Every day | ORAL | Status: DC
  Administered 2011-11-05 – 2011-11-08 (×4): 12.5 mg via ORAL
  Filled 2011-11-05 (×4): qty 1

## 2011-11-05 MED ORDER — DROPERIDOL 2.5 MG/ML IJ SOLN
0.6250 mg | INTRAMUSCULAR | Status: DC | PRN
  Filled 2011-11-05: qty 0.25

## 2011-11-05 MED ORDER — ONDANSETRON HCL 4 MG/2ML IJ SOLN: 4.0000 mg | Freq: Four times a day (QID) | INTRAMUSCULAR | Status: DC | PRN

## 2011-11-05 MED ORDER — TEMAZEPAM 15 MG PO CAPS: 15.0000 mg | ORAL_CAPSULE | Freq: Every evening | ORAL | Status: DC | PRN

## 2011-11-05 MED ORDER — MENTHOL 3 MG MT LOZG
1.0000 | LOZENGE | OROMUCOSAL | Status: DC | PRN
  Filled 2011-11-05: qty 9

## 2011-11-05 MED ORDER — FLUTICASONE PROPIONATE 50 MCG/ACT NA SUSP
2.0000 | Freq: Every day | NASAL | Status: DC
  Filled 2011-11-05: qty 16

## 2011-11-05 MED ORDER — BISACODYL 10 MG RE SUPP: 10.0000 mg | Freq: Every day | RECTAL | Status: DC | PRN

## 2011-11-05 MED ORDER — METHOCARBAMOL 500 MG PO TABS
500.0000 mg | ORAL_TABLET | Freq: Four times a day (QID) | ORAL | Status: DC | PRN
  Administered 2011-11-06 – 2011-11-08 (×7): 500 mg via ORAL
  Filled 2011-11-05 (×7): qty 1

## 2011-11-05 MED ORDER — HYDROMORPHONE HCL PF 1 MG/ML IJ SOLN: 0.2500 mg | INTRAMUSCULAR | Status: DC | PRN

## 2011-11-05 MED ORDER — DEXAMETHASONE SODIUM PHOSPHATE 10 MG/ML IJ SOLN: 10.0000 mg | Freq: Once | INTRAMUSCULAR | Status: DC

## 2011-11-05 MED ORDER — ACETAMINOPHEN 325 MG PO TABS
650.0000 mg | ORAL_TABLET | Freq: Four times a day (QID) | ORAL | Status: DC | PRN
  Administered 2011-11-07 (×2): 650 mg via ORAL
  Filled 2011-11-05 (×2): qty 2

## 2011-11-05 MED ORDER — POLYETHYLENE GLYCOL 3350 17 G PO PACK
17.0000 g | PACK | Freq: Every day | ORAL | Status: DC | PRN
  Filled 2011-11-05: qty 1

## 2011-11-05 MED ORDER — ONDANSETRON HCL 4 MG PO TABS
4.0000 mg | ORAL_TABLET | Freq: Four times a day (QID) | ORAL | Status: DC | PRN
  Administered 2011-11-07: 4 mg via ORAL
  Filled 2011-11-05 (×2): qty 1

## 2011-11-05 MED ORDER — RIVAROXABAN 10 MG PO TABS
10.0000 mg | ORAL_TABLET | Freq: Every day | ORAL | Status: DC
  Administered 2011-11-06 – 2011-11-08 (×3): 10 mg via ORAL
  Filled 2011-11-05 (×3): qty 1

## 2011-11-05 MED ORDER — SODIUM CHLORIDE 0.9 % IJ SOLN: 9.0000 mL | INTRAMUSCULAR | Status: DC | PRN

## 2011-11-05 MED ORDER — LACTATED RINGERS IV SOLN
INTRAVENOUS | Status: DC | PRN
  Administered 2011-11-05 (×2): via INTRAVENOUS

## 2011-11-05 MED ORDER — BUPIVACAINE ON-Q PAIN PUMP (FOR ORDER SET NO CHG)
INJECTION | Status: DC
  Filled 2011-11-05: qty 1

## 2011-11-05 MED ORDER — POLYVINYL ALCOHOL 1.4 % OP SOLN
2.0000 [drp] | Freq: Two times a day (BID) | OPHTHALMIC | Status: DC | PRN
  Filled 2011-11-05: qty 15

## 2011-11-05 MED ORDER — OXYCODONE HCL 5 MG PO TABS
5.0000 mg | ORAL_TABLET | ORAL | Status: DC | PRN
  Administered 2011-11-06 (×2): 10 mg via ORAL
  Administered 2011-11-06: 5 mg via ORAL
  Administered 2011-11-06 – 2011-11-07 (×3): 10 mg via ORAL
  Administered 2011-11-07 (×2): 5 mg via ORAL
  Administered 2011-11-08 (×3): 10 mg via ORAL
  Filled 2011-11-05 (×5): qty 2
  Filled 2011-11-05: qty 1
  Filled 2011-11-05: qty 2
  Filled 2011-11-05: qty 1
  Filled 2011-11-05 (×3): qty 2

## 2011-11-05 MED ORDER — BUPIVACAINE IN DEXTROSE 0.75-8.25 % IT SOLN
INTRATHECAL | Status: DC | PRN
  Administered 2011-11-05: 11.24 mg via INTRATHECAL

## 2011-11-05 MED ORDER — SODIUM CHLORIDE 0.9 % IR SOLN
Status: DC | PRN
  Administered 2011-11-05: 1000 mL

## 2011-11-05 MED ORDER — AMLODIPINE BESYLATE 5 MG PO TABS
5.0000 mg | ORAL_TABLET | Freq: Every morning | ORAL | Status: DC
  Administered 2011-11-06 – 2011-11-08 (×3): 5 mg via ORAL
  Filled 2011-11-05 (×4): qty 1

## 2011-11-05 MED ORDER — VANCOMYCIN HCL IN DEXTROSE 1-5 GM/200ML-% IV SOLN
1000.0000 mg | Freq: Two times a day (BID) | INTRAVENOUS | Status: AC
  Administered 2011-11-05: 1000 mg via INTRAVENOUS
  Filled 2011-11-05: qty 200

## 2011-11-05 MED ORDER — DOCUSATE SODIUM 100 MG PO CAPS
100.0000 mg | ORAL_CAPSULE | Freq: Two times a day (BID) | ORAL | Status: DC
  Administered 2011-11-05 – 2011-11-08 (×6): 100 mg via ORAL
  Filled 2011-11-05 (×6): qty 1

## 2011-11-05 MED ORDER — MORPHINE SULFATE (PF) 1 MG/ML IV SOLN
INTRAVENOUS | Status: DC
  Administered 2011-11-05: 21.69 mg via INTRAVENOUS
  Administered 2011-11-05: 8 mg via INTRAVENOUS
  Administered 2011-11-05: 3 mg via INTRAVENOUS
  Administered 2011-11-05: 12:00:00 via INTRAVENOUS
  Administered 2011-11-06: 3 mg via INTRAVENOUS
  Administered 2011-11-06: 1 mg via INTRAVENOUS
  Administered 2011-11-06: 2 mg via INTRAVENOUS
  Filled 2011-11-05: qty 25

## 2011-11-05 MED ORDER — PHENOL 1.4 % MT LIQD
1.0000 | OROMUCOSAL | Status: DC | PRN
  Filled 2011-11-05: qty 177

## 2011-11-05 MED ORDER — ACETAMINOPHEN 650 MG RE SUPP: 650.0000 mg | Freq: Four times a day (QID) | RECTAL | Status: DC | PRN

## 2011-11-05 MED ORDER — BUPIVACAINE 0.25 % ON-Q PUMP SINGLE CATH 300ML: 300.0000 mL | INJECTION | Status: DC

## 2011-11-05 MED ORDER — HYDROCHLOROTHIAZIDE 25 MG PO TABS
12.5000 mg | ORAL_TABLET | Freq: Every morning | ORAL | Status: DC
  Filled 2011-11-05 (×3): qty 0.5

## 2011-11-05 MED ORDER — ONDANSETRON HCL 4 MG/2ML IJ SOLN
INTRAMUSCULAR | Status: DC | PRN
  Administered 2011-11-05: 4 mg via INTRAVENOUS

## 2011-11-05 MED ORDER — DEXAMETHASONE SODIUM PHOSPHATE 10 MG/ML IJ SOLN
INTRAMUSCULAR | Status: DC | PRN
  Administered 2011-11-05: 10 mg via INTRAVENOUS

## 2011-11-05 MED ORDER — METOCLOPRAMIDE HCL 5 MG/ML IJ SOLN
5.0000 mg | Freq: Three times a day (TID) | INTRAMUSCULAR | Status: DC | PRN
  Filled 2011-11-05: qty 2

## 2011-11-05 MED ORDER — ACETAMINOPHEN 10 MG/ML IV SOLN: 1000.0000 mg | Freq: Once | INTRAVENOUS | Status: DC

## 2011-11-05 MED ORDER — DEXTROSE 5 % IV SOLN
500.0000 mg | Freq: Four times a day (QID) | INTRAVENOUS | Status: DC | PRN
  Filled 2011-11-05: qty 5

## 2011-11-05 MED ORDER — KCL IN DEXTROSE-NACL 20-5-0.45 MEQ/L-%-% IV SOLN
INTRAVENOUS | Status: DC
  Administered 2011-11-05 – 2011-11-06 (×2): via INTRAVENOUS
  Filled 2011-11-05 (×3): qty 1000

## 2011-11-05 MED ORDER — ACETAMINOPHEN 10 MG/ML IV SOLN
INTRAVENOUS | Status: DC | PRN
  Administered 2011-11-05: 1000 mg via INTRAVENOUS

## 2011-11-05 MED ORDER — LACTATED RINGERS IV SOLN: INTRAVENOUS | Status: DC

## 2011-11-05 MED ORDER — BUPIVACAINE 0.25 % ON-Q PUMP SINGLE CATH 300ML
INJECTION | Status: DC | PRN
  Administered 2011-11-05: 300 mL

## 2011-11-05 MED ORDER — DIPHENHYDRAMINE HCL 12.5 MG/5ML PO ELIX
12.5000 mg | ORAL_SOLUTION | Freq: Four times a day (QID) | ORAL | Status: DC | PRN
  Filled 2011-11-05: qty 5

## 2011-11-05 SURGICAL SUPPLY — 52 items
BAG SPEC THK2 15X12 ZIP CLS (MISCELLANEOUS) ×1
BAG ZIPLOCK 12X15 (MISCELLANEOUS) ×2 IMPLANT
BANDAGE ELASTIC 6 VELCRO ST LF (GAUZE/BANDAGES/DRESSINGS) ×2 IMPLANT
BANDAGE ESMARK 6X9 LF (GAUZE/BANDAGES/DRESSINGS) ×1 IMPLANT
BLADE SAG 18X100X1.27 (BLADE) ×2 IMPLANT
BLADE SAW SGTL 11.0X1.19X90.0M (BLADE) ×2 IMPLANT
BNDG CMPR 9X6 STRL LF SNTH (GAUZE/BANDAGES/DRESSINGS) ×1
BNDG ESMARK 6X9 LF (GAUZE/BANDAGES/DRESSINGS) ×2
BOWL SMART MIX CTS (DISPOSABLE) ×2 IMPLANT
CATH KIT ON-Q SILVERSOAK 5 (CATHETERS) ×1 IMPLANT
CATH KIT ON-Q SILVERSOAK 5IN (CATHETERS) ×2 IMPLANT
CEMENT HV SMART SET (Cement) ×4 IMPLANT
CLOTH BEACON ORANGE TIMEOUT ST (SAFETY) ×2 IMPLANT
CUFF TOURN SGL QUICK 34 (TOURNIQUET CUFF) ×2
CUFF TRNQT CYL 34X4X40X1 (TOURNIQUET CUFF) ×1 IMPLANT
DRAPE EXTREMITY T 121X128X90 (DRAPE) ×2 IMPLANT
DRAPE POUCH INSTRU U-SHP 10X18 (DRAPES) ×2 IMPLANT
DRAPE U-SHAPE 47X51 STRL (DRAPES) ×2 IMPLANT
DRSG ADAPTIC 3X8 NADH LF (GAUZE/BANDAGES/DRESSINGS) ×2 IMPLANT
DURAPREP 26ML APPLICATOR (WOUND CARE) ×2 IMPLANT
ELECT REM PT RETURN 9FT ADLT (ELECTROSURGICAL) ×2
ELECTRODE REM PT RTRN 9FT ADLT (ELECTROSURGICAL) ×1 IMPLANT
EVACUATOR 1/8 PVC DRAIN (DRAIN) ×2 IMPLANT
FACESHIELD LNG OPTICON STERILE (SAFETY) ×10 IMPLANT
GLOVE BIO SURGEON STRL SZ7.5 (GLOVE) ×2 IMPLANT
GLOVE BIO SURGEON STRL SZ8 (GLOVE) ×2 IMPLANT
GLOVE BIOGEL PI IND STRL 8 (GLOVE) ×2 IMPLANT
GLOVE BIOGEL PI INDICATOR 8 (GLOVE) ×2
GOWN STRL NON-REIN LRG LVL3 (GOWN DISPOSABLE) ×2 IMPLANT
GOWN STRL REIN XL XLG (GOWN DISPOSABLE) ×2 IMPLANT
HANDPIECE INTERPULSE COAX TIP (DISPOSABLE) ×2
IMMOBILIZER KNEE 20 (SOFTGOODS) ×2
IMMOBILIZER KNEE 20 THIGH 36 (SOFTGOODS) ×1 IMPLANT
KIT BASIN OR (CUSTOM PROCEDURE TRAY) ×2 IMPLANT
MANIFOLD NEPTUNE II (INSTRUMENTS) ×2 IMPLANT
NS IRRIG 1000ML POUR BTL (IV SOLUTION) ×2 IMPLANT
PACK TOTAL JOINT (CUSTOM PROCEDURE TRAY) ×2 IMPLANT
PAD ABD 7.5X8 STRL (GAUZE/BANDAGES/DRESSINGS) ×2 IMPLANT
PADDING CAST COTTON 6X4 STRL (CAST SUPPLIES) ×6 IMPLANT
POSITIONER SURGICAL ARM (MISCELLANEOUS) ×2 IMPLANT
SET HNDPC FAN SPRY TIP SCT (DISPOSABLE) ×1 IMPLANT
SPONGE GAUZE 4X4 12PLY (GAUZE/BANDAGES/DRESSINGS) ×2 IMPLANT
STRIP CLOSURE SKIN 1/2X4 (GAUZE/BANDAGES/DRESSINGS) ×4 IMPLANT
SUCTION FRAZIER 12FR DISP (SUCTIONS) ×2 IMPLANT
SUT MNCRL AB 4-0 PS2 18 (SUTURE) ×2 IMPLANT
SUT PDS AB 1 CT1 27 (SUTURE) ×6 IMPLANT
SUT VIC AB 2-0 CT1 27 (SUTURE) ×6
SUT VIC AB 2-0 CT1 TAPERPNT 27 (SUTURE) ×3 IMPLANT
TOWEL OR 17X26 10 PK STRL BLUE (TOWEL DISPOSABLE) ×4 IMPLANT
TRAY FOLEY CATH 14FRSI W/METER (CATHETERS) ×2 IMPLANT
WATER STERILE IRR 1500ML POUR (IV SOLUTION) ×2 IMPLANT
WRAP KNEE MAXI GEL POST OP (GAUZE/BANDAGES/DRESSINGS) ×4 IMPLANT

## 2011-11-05 NOTE — Anesthesia Procedure Notes (Signed)
Spinal  Patient location during procedure: OR End time: 11/05/2011 9:59 AM Staffing CRNA/Resident: Enriqueta Shutter Performed by: resident/CRNA  Preanesthetic Checklist Completed: patient identified, site marked, surgical consent, pre-op evaluation, timeout performed, IV checked, risks and benefits discussed and monitors and equipment checked Spinal Block Patient position: sitting Prep: Betadine Patient monitoring: heart rate, continuous pulse ox and blood pressure Approach: midline Location: L3-4 Injection technique: single-shot Needle Needle type: Sprotte  Needle gauge: 24 G Needle length: 5 cm Assessment Sensory level: T6 Additional Notes Expiration date of kit checked and confirmed. Patient tolerated procedure well, without complications.

## 2011-11-05 NOTE — H&P (View-Only) (Signed)
Carla Schmitt  DOB: 04/23/1946 Married / Language: English / Race: Black or African American / Female  Date of Admission: 11/05/2011  Chief Complaint:  Left Knee Pain  History of Present Illness  The patient is a 65 year old female who comes in  for a preoperative History and Physical. The patient is scheduled for a left total knee arthroplasty to be performed by Dr. Frank V. Aluisio, MD at [facility] on 11/05/2011. The patient is a 65 year old female who presents with knee complaints. The patient is seen in referral from Dr. Bruce Burchette. The patient reports left knee and right knee symptoms including: pain, swelling, instability, giving way and stiffness which began 30 year(s) ago without any known injury. Ms. Dickison states that both knees hurt at all times. The left knee is somewhat more symptomatic than the right. She has had problems in the knee for many years and it has gotten progressively worse in the past couple of years. She is at a stage where the knees have essentially taken over everything she can and can not do. She has had injections in the past without much benefit. She has had cortisone but no visco supplements. She can no longer walk. The knees are hurting her day and night. They are affecting her ability to do any activities of daily living. She would like to proceed with surgery. They have been treated conservatively in the past for the above stated problem and despite conservative measures, they continue to have progressive pain and severe functional limitations and dysfunction. They have failed non-operative management. It is felt that they would benefit from undergoing total joint replacement. Risks and benefits of the procedure have been discussed with the patient and they elect to proceed with surgery. There are no active contraindications to surgery such as ongoing infection or rapidly progressive neurological disease.  Allergies  Ceftin  *CEPHALOSPORINS*. Rash.  Problem List/Past Medical  Varicose veins Irritable bowel syndrome Osteoarthritis, Knee (715.96)  Family History  Father. Deceased, Congestive Heart Failure, Hypertension. age 70's Mother. Deceased. age 82, History of Pneumonia  Social History  Marital status. Married. Tobacco use. Former smoker. Alcohol use. Never consumed alcohol. Living situation. Lives with spouse. Post-Surgical Plans. Plan to go home  Medication History  Naproxen (500MG Tablet, Oral as needed) Active. Beconase AQ (42MCG/SPRAY Suspension, Nasal) Active. Gabapentin ( Oral) Specific dose unknown - Active. Vitamin D ( Oral) Specific dose unknown - Active.  Past Surgical History  Hysterectomy. Date: 02/2010. partial (non-cancerous) Foot Surgery. bilateral Colon Polyp Removal - Colonoscopy  Review of Systems  General:Present- Night Sweats. Not Present- Chills, Fever, Fatigue, Weight Gain, Weight Loss and Memory Loss. Skin:Not Present- Hives, Itching, Rash, Eczema and Lesions. HEENT:Present- Tinnitus. Not Present- Headache, Double Vision, Visual Loss, Hearing Loss and Dentures. Respiratory:Not Present- Shortness of breath with exertion, Shortness of breath at rest, Allergies, Coughing up blood and Chronic Cough. Cardiovascular:Not Present- Chest Pain, Racing/skipping heartbeats, Difficulty Breathing Lying Down, Murmur, Swelling and Palpitations. Gastrointestinal:Not Present- Bloody Stool, Heartburn, Abdominal Pain, Vomiting, Nausea, Constipation, Diarrhea, Difficulty Swallowing, Jaundice and Loss of appetitie. Female Genitourinary:Present- Urinating at Night. Not Present- Blood in Urine, Urinary frequency, Weak urinary stream, Discharge, Flank Pain, Incontinence, Painful Urination, Urgency and Urinary Retention. Musculoskeletal:Present- Muscle Weakness, Muscle Pain, Joint Swelling, Joint Pain and Morning Stiffness. Not Present- Back Pain and Spasms. Neurological:Not  Present- Tremor, Dizziness, Blackout spells, Paralysis, Difficulty with balance and Weakness. Psychiatric:Not Present- Insomnia.  Vitals Weight: 63.5 lb Height: 239 in Body Surface Area:   2.2 m Body Mass Index: 0.78 kg/m Pulse: 68 (Regular) Resp.: 14 (Unlabored) BP: 128/82 (Sitting, Left Arm, Standard)   Physical Exam The physical exam findings are as follows:  Patient is a 65 year old female with continued left knee pain.  General Mental Status - Alert, cooperative and good historian. General Appearance- pleasant. Not in acute distress. Orientation- Oriented X3. Build & Nutrition- Overweight, Obese (hip and thigh) and Well developed.  Head and Neck Head- normocephalic, atraumatic . Neck Global Assessment- supple. no bruit auscultated on the right and no bruit auscultated on the left.  ENT Pupil- Bilateral- Regular and Round. Note: reading glasses at times Motion- Bilateral- EOMI.  Upper and lower dentures  Chest and Lung Exam Auscultation: Breath sounds:- clear at anterior chest wall and - clear at posterior chest wall. Adventitious sounds:- No Adventitious sounds.  Cardiovascular Auscultation:Rhythm- Regular rate and rhythm. Heart Sounds- S1 WNL and S2 WNL. Murmurs & Other Heart Sounds:Auscultation of the heart reveals - No Murmurs.  Abdomen Palpation/Percussion:Tenderness- Abdomen is non-tender to palpation. Rigidity (guarding)- Abdomen is soft. Auscultation:Auscultation of the abdomen reveals - Bowel sounds normal.  Female Genitourinary Not done, not pertinent to present illness  Musculoskeletal Well developed female alert and oriented in no apparent distress. Evaluation of her hips show normal range of motion with no discomfort. Her left knee shows no effusion. Range is about 10 to 110. There is marked crepitus on range of motion of the knee. She is tender medial greater than lateral. There is no instability noted. Right  knee range 5 to 110, moderate crepitus on range of motion. Tender medial greater than lateral with no instability noted. Pulses are intact distally with intact sensation and motor function.  RADIOGRAPHS: AP both knees and lateral show severe advanced endstage arthritis of both knees. She has severe bone on bone all three compartments worse medial and patellofemoral with significant varus deformity, significant bony erosions and tibial subluxation with cyst formation.  Assessment & Plan  Osteoarthritis Left Knee  Patient is for a Left Total Knee Replacement by Dr. Aluisio.  Plan is to go home after the hospital stay.  PCP- Dr. Bruce Burchett GYN - Dr. Robert Wein  Drew Lafreda Casebeer, PA-C  

## 2011-11-05 NOTE — Interval H&P Note (Signed)
History and Physical Interval Note:  11/05/2011 9:45 AM  Carla Schmitt  has presented today for surgery, with the diagnosis of Osteoarthritis of the Left Knee  The various methods of treatment have been discussed with the patient and family. After consideration of risks, benefits and other options for treatment, the patient has consented to  Procedure(s) (LRB): TOTAL KNEE ARTHROPLASTY (Left) as a surgical intervention .  The patients' history has been reviewed, patient examined, no change in status, stable for surgery.  I have reviewed the patients' chart and labs.  Questions were answered to the patient's satisfaction.     Loanne Drilling

## 2011-11-05 NOTE — Transfer of Care (Signed)
Immediate Anesthesia Transfer of Care Note  Patient: Carla Schmitt  Procedure(s) Performed: Procedure(s) (LRB): TOTAL KNEE ARTHROPLASTY (Left)  Patient Location: PACU  Anesthesia Type: Regional  Level of Consciousness: sedated, patient cooperative and responds to stimulaton  Airway & Oxygen Therapy: Patient Spontanous Breathing and Patient connected to face mask oxgen  Post-op Assessment: Report given to PACU RN and Post -op Vital signs reviewed and stable  Post vital signs: Reviewed and stable  Complications: No apparent anesthesia complications

## 2011-11-05 NOTE — Plan of Care (Signed)
Problem: Consults Goal: Diagnosis- Total Joint Replacement Outcome: Completed/Met Date Met:  11/05/11 Primary Total Knee     

## 2011-11-05 NOTE — Anesthesia Preprocedure Evaluation (Addendum)
Anesthesia Evaluation  Patient identified by MRN, date of birth, ID band Patient awake    Reviewed: Allergy & Precautions, H&P , NPO status , Patient's Chart, lab work & pertinent test results  Airway Mallampati: II TM Distance: >3 FB Neck ROM: Full    Dental No notable dental hx.    Pulmonary neg pulmonary ROS,  breath sounds clear to auscultation  Pulmonary exam normal       Cardiovascular hypertension, negative cardio ROS  Rhythm:Regular Rate:Normal     Neuro/Psych negative neurological ROS  negative psych ROS   GI/Hepatic negative GI ROS, Neg liver ROS,   Endo/Other  negative endocrine ROS  Renal/GU negative Renal ROS  negative genitourinary   Musculoskeletal negative musculoskeletal ROS (+)   Abdominal   Peds negative pediatric ROS (+)  Hematology negative hematology ROS (+)   Anesthesia Other Findings   Reproductive/Obstetrics negative OB ROS                           Anesthesia Physical Anesthesia Plan  ASA: II  Anesthesia Plan: Spinal   Post-op Pain Management:    Induction:   Airway Management Planned:   Additional Equipment:   Intra-op Plan:   Post-operative Plan:   Informed Consent: I have reviewed the patients History and Physical, chart, labs and discussed the procedure including the risks, benefits and alternatives for the proposed anesthesia with the patient or authorized representative who has indicated his/her understanding and acceptance.   Dental advisory given  Plan Discussed with: CRNA  Anesthesia Plan Comments:         Anesthesia Quick Evaluation

## 2011-11-05 NOTE — Anesthesia Postprocedure Evaluation (Signed)
  Anesthesia Post-op Note  Patient: Carla Schmitt  Procedure(s) Performed: Procedure(s) (LRB): TOTAL KNEE ARTHROPLASTY (Left)  Patient Location: PACU  Anesthesia Type: Spinal  Level of Consciousness: awake and alert   Airway and Oxygen Therapy: Patient Spontanous Breathing  Post-op Pain: mild  Post-op Assessment: Post-op Vital signs reviewed, Patient's Cardiovascular Status Stable, Respiratory Function Stable, Patent Airway and No signs of Nausea or vomiting  Post-op Vital Signs: stable  Complications: No apparent anesthesia complications

## 2011-11-05 NOTE — Op Note (Signed)
Pre-operative diagnosis- Osteoarthritis  Left knee(s)  Post-operative diagnosis- Osteoarthritis Left knee(s)  Procedure-  Left  Total Knee Arthroplasty  Surgeon- Gus Belflower. Korion Cuevas, MD  Assistant- Dimitri Ped, PA-C   Anesthesia-  Spinal EBL-* No blood loss amount entered *  Drains Hemovac  Tourniquet time-  Total Tourniquet Time Documented: Thigh (Left) - 51 minutes   Complications- None  Condition-PACU - hemodynamically stable.   Brief Clinical Note  Carla Schmitt is a 66 y.o. year old female with end stage OA of her left knee with progressively worsening pain and dysfunction. She has constant pain, with activity and at rest and significant functional deficits with difficulties even with ADLs. She has had extensive non-op management including analgesics, injections of cortisone and viscosupplements, and home exercise program, but remains in significant pain with significant dysfunction. Radiographs show bone on bone arthritis all 3 compartments with medial tibial erosion. She presents now for left Total Knee Arthroplasty.    Procedure in detail---   The patient is brought into the operating room and positioned supine on the operating table. After successful administration of  Spinal,   a tourniquet is placed high on the  Left thigh(s) and the lower extremity is prepped and draped in the usual sterile fashion. Time out is performed by the operating team and then the  Left lower extremity is wrapped in Esmarch, knee flexed and the tourniquet inflated to 300 mmHg.       A midline incision is made with a ten blade through the subcutaneous tissue to the level of the extensor mechanism. A fresh blade is used to make a medial parapatellar arthrotomy. Soft tissue over the proximal medial tibia is subperiosteally elevated to the joint line with a knife and into the semimembranosus bursa with a Cobb elevator. Soft tissue over the proximal lateral tibia is elevated with attention being paid to  avoiding the patellar tendon on the tibial tubercle. The patella is everted, knee flexed 90 degrees and the ACL and PCL are removed. Findings are bone on bone all 3 compartments with massive osteophytes.        The drill is used to create a starting hole in the distal femur and the canal is thoroughly irrigated with sterile saline to remove the fatty contents. The 5 degree Left valgus alignment guide is placed into the femoral canal and the distal femoral cutting block is pinned to remove 11 mm off the distal femur. Resection is made with an oscillating saw.      The tibia is subluxed forward and the menisci are removed. The extramedullary alignment guide is placed referencing proximally at the medial aspect of the tibial tubercle and distally along the second metatarsal axis and tibial crest. The block is pinned to remove 2mm off the more deficient medial side. Resection is made with an oscillating saw. Size 4is the most appropriate size for the tibia and the proximal tibia is prepared with the modular drill and keel punch for that size.      The femoral sizing guide is placed and size 4 is most appropriate. Rotation is marked off the epicondylar axis and confirmed by creating a rectangular flexion gap at 90 degrees. The size 4 cutting block is pinned in this rotation and the anterior, posterior and chamfer cuts are made with the oscillating saw. The intercondylar block is then placed and that cut is made.      Trial size 4 tibial component, trial size 4 posterior stabilized femur and a 12.5  mm posterior stabilized rotating platform insert trial is placed. Full extension is achieved with excellent varus/valgus and anterior/posterior balance throughout full range of motion. The patella is everted and thickness measured to be 24  mm. Free hand resection is taken to 14 mm, a 35 template is placed, lug holes are drilled, trial patella is placed, and it tracks normally. Osteophytes are removed off the posterior  femur with the trial in place. All trials are removed and the cut bone surfaces prepared with pulsatile lavage. Cement is mixed and once ready for implantation, the size 4 tibial implant, size  4 posterior stabilized femoral component, and the size 35 patella are cemented in place and the patella is held with the clamp. The trial insert is placed and the knee held in full extension. All extruded cement is removed and once the cement is hard the permanent 12.5 mm posterior stabilized rotating platform insert is placed into the tibial tray.      The wound is copiously irrigated with saline solution and the extensor mechanism closed over a hemovac drain with #1 PDS suture. The tourniquet is released for a total tourniquet time of 51  minutes. Flexion against gravity is 135 degrees and the patella tracks normally. Subcutaneous tissue is closed with 2.0 vicryl and subcuticular with running 4.0 Monocryl. The catheter for the Marcaine pain pump is placed and the pump is initiated. The incision is cleaned and dried and steri-strips and a bulky sterile dressing are applied. The limb is placed into a knee immobilizer and the patient is awakened and transported to recovery in stable condition.      Please note that a surgical assistant was a medical necessity for this procedure in order to perform it in a safe and expeditious manner. Surgical assistant was necessary to retract the ligaments and vital neurovascular structures to prevent injury to them and also necessary for proper positioning of the limb to allow for anatomic placement of the prosthesis.   Gus Bitter Carla Mannings, MD    11/05/2011, 11:21 AM

## 2011-11-06 DIAGNOSIS — E871 Hypo-osmolality and hyponatremia: Secondary | ICD-10-CM | POA: Diagnosis not present

## 2011-11-06 LAB — CBC
HCT: 29.2 % — ABNORMAL LOW (ref 36.0–46.0)
MCH: 29.5 pg (ref 26.0–34.0)
MCV: 82.7 fL (ref 78.0–100.0)
Platelets: 237 10*3/uL (ref 150–400)
RBC: 3.53 MIL/uL — ABNORMAL LOW (ref 3.87–5.11)
RDW: 14.9 % (ref 11.5–15.5)
WBC: 9.5 10*3/uL (ref 4.0–10.5)

## 2011-11-06 LAB — BASIC METABOLIC PANEL
BUN: 12 mg/dL (ref 6–23)
CO2: 29 mEq/L (ref 19–32)
Calcium: 8.6 mg/dL (ref 8.4–10.5)
Chloride: 98 mEq/L (ref 96–112)
Creatinine, Ser: 0.68 mg/dL (ref 0.50–1.10)

## 2011-11-06 MED ORDER — SODIUM CHLORIDE 0.9 % IV SOLN
INTRAVENOUS | Status: DC
  Administered 2011-11-06: 20 mL/h via INTRAVENOUS

## 2011-11-06 MED ORDER — MORPHINE SULFATE 2 MG/ML IJ SOLN
1.0000 mg | INTRAMUSCULAR | Status: DC | PRN
  Administered 2011-11-06 – 2011-11-07 (×2): 2 mg via INTRAVENOUS
  Filled 2011-11-06 (×2): qty 1

## 2011-11-06 NOTE — Progress Notes (Signed)
Physical Therapy Treatment Patient Details Name: Carla Schmitt MRN: [id number] DOB: 1946-08-30 Today's Date: 11/06/2011  PT Assessment/Plan  PT - Assessment/Plan Comments on Treatment Session: Pt progressing very well with ambulation and exercises.  Pt with some increase in pain following exercises.  RN made aware.  PT Plan: Discharge plan remains appropriate PT Frequency: 7X/week Recommendations for Other Services: OT consult Follow Up Recommendations: Home health PT Equipment Recommended: None recommended by PT PT Goals  Acute Rehab PT Goals PT Goal Formulation: With patient Time For Goal Achievement: 4 days Pt will go Sit to Supine/Side: with supervision PT Goal: Sit to Supine/Side - Progress: Progressing toward goal Pt will go Sit to Stand: with modified independence PT Goal: Sit to Stand - Progress: Progressing toward goal Pt will go Stand to Sit: with modified independence PT Goal: Stand to Sit - Progress: Progressing toward goal Pt will Ambulate: 51 - 150 feet;with modified independence;with least restrictive assistive device PT Goal: Ambulate - Progress: Progressing toward goal Pt will Perform Home Exercise Program: Independently PT Goal: Perform Home Exercise Program - Progress: Progressing toward goal  PT Treatment Precautions/Restrictions  Precautions Precautions: Knee Required Braces or Orthoses: Yes Knee Immobilizer: Discontinue once straight leg raise with < 10 degree lag Restrictions Weight Bearing Restrictions: Yes LLE Weight Bearing: Weight bearing as tolerated Mobility (including Balance) Bed Mobility Bed Mobility: Yes Sit to Supine: 4: Min assist;HOB flat Sit to Supine - Details (indicate cue type and reason): Min A for LLE into bed.  Pt demos good technique with UE to self assist and adjust self in bed.  Transfers Transfers: Yes Sit to Stand: 4: Min assist;With upper extremity assist;With armrests;From chair/3-in-1 Sit to Stand Details (indicate cue  type and reason): Min/guard for safety with cues for hand placement.  Stand to Sit: 5: Supervision;With upper extremity assist;To elevated surface;To bed Stand to Sit Details: Supervision for safety with cues for hand placement and LLE management.  Ambulation/Gait Ambulation/Gait: Yes Ambulation/Gait Assistance: 5: Supervision Ambulation/Gait Assistance Details (indicate cue type and reason): Supervision for safety with cues for upright posture.  Ambulation Distance (Feet): 100 Feet Assistive device: Rolling walker Gait Pattern: Step-through pattern Gait velocity: improving    Exercise  Total Joint Exercises Ankle Circles/Pumps: AROM;Both;20 reps;Supine Quad Sets: AROM;Left;10 reps;Supine Short Arc Quad: AROM;Left;10 reps;Supine Heel Slides: AAROM;Left;10 reps;Supine Hip ABduction/ADduction: AAROM;Left;10 reps;Supine Straight Leg Raises: AROM;AAROM;Left;10 reps;Supine End of Session PT - End of Session Equipment Utilized During Treatment: Left knee immobilizer Activity Tolerance: Patient tolerated treatment well Patient left: in bed;with call bell in reach;with family/visitor present General Behavior During Session: [facility] for tasks performed Cognition: [facility] for tasks performed  [name], Meribeth Mattes 11/06/2011, 2:42 PM

## 2011-11-06 NOTE — Progress Notes (Signed)
Subjective: 1 Day Post-Op Procedure(s) (LRB): TOTAL KNEE ARTHROPLASTY (Left) Patient reports pain as mild.   Patient seen in rounds with Dr. Lequita Halt. Patient has complaints of some soreness. We will start therapy today. Plan is to go home after hospital stay.  Objective: Vital signs in last 24 hours: Temp:  [97.4 F (36.3 C)-98.9 F (37.2 C)] 98.9 F (37.2 C) (03/05 0557) Pulse Rate:  [59-88] 76  (03/05 0557) Resp:  [11-16] 16  (03/05 0557) BP: (129-162)/(60-82) 130/79 mmHg (03/05 0557) SpO2:  [97 %-100 %] 97 % (03/05 0557) FiO2 (%):  [100 %] 100 % (03/05 0400) Weight:  [106.595 kg (235 lb)] 106.595 kg (235 lb) (03/04 1255)  Intake/Output from previous day:  Intake/Output Summary (Last 24 hours) at 11/06/11 0814 Last data filed at 11/06/11 0558  Gross per 24 hour  Intake   3030 ml  Output   3085 ml  Net    -55 ml    Intake/Output this shift:    Labs:  Basename 11/06/11 0414  HGB 10.4*    Basename 11/06/11 0414  WBC 9.5  RBC 3.53*  HCT 29.2*  PLT 237    Basename 11/06/11 0414  NA 134*  K 3.5  CL 98  CO2 29  BUN 12  CREATININE 0.68  GLUCOSE 139*  CALCIUM 8.6   No results found for this basename: LABPT:2,INR:2 in the last 72 hours  Exam - Neurovascular intact Sensation intact distally Dressing - clean, dry, no drainage Motor function intact - moving foot and toes well on exam.  Hemovac pulled without difficulty.  Past Medical History  Diagnosis Date  . Anxiety   . Bronchitis   . UTI (urinary tract infection)   . Colon polyps   . Hypertension   . Varicose veins     both legs  . Irritable bowel   . Arthritis   . Headache     tension, or allergy headaches  . Chronic kidney disease      kidney stones, passed on own  . Tinnitus     both ears most of the time    Assessment/Plan: 1 Day Post-Op Procedure(s) (LRB): TOTAL KNEE ARTHROPLASTY (Left) Active Problems:  Postop Hyponatremia   Advance diet Up with therapy Continue foley due to  strict I&O and urinary output monitoring Discharge home with home health  DVT Prophylaxis - Xarelto Protocol Weight-Bearing as tolerated to left leg Keep foley until tomorrow. No vaccines. D/C PCA Morphine, Change to IV push D/C O2 and Pulse OX and try on Room Air  Carla Schmitt 11/06/2011, 8:14 AM

## 2011-11-06 NOTE — Progress Notes (Signed)
CARE MANAGEMENT NOTE 11/06/2011  Patient:  Carla Schmitt, Carla Schmitt   Account Number:  [id number]  Date Initiated:  11/06/2011  Documentation initiated by:  Colleen Can  Subjective/Objective Assessment:   dx osteoarthritis right knee; total knee replacemnt     Action/Plan:   CM spoke with patient and sister. Plans are for patient to return to her home in [place] where her sister will be caregiver. States she will need rw, 3n1. wants agency that is in network for [facility] services   Anticipated DC Date:  11/08/2011   Anticipated DC Plan:  HOME W HOME HEALTH SERVICES  In-house referral  NA      DC Planning Services  CM consult      [facility] Choice  HOME HEALTH  DURABLE MEDICAL EQUIPMENT   Choice offered to / List presented to:  C-1 Patient           Status of service:  In process, will continue to follow  Comments:  11/06/2011 CM will search for agency to provide [facility] services.

## 2011-11-06 NOTE — Evaluation (Signed)
Physical Therapy Evaluation Patient Details Name: Carla Schmitt MRN: [id number] DOB: Apr 12, 1946 Today's Date: 11/06/2011  Problem List:  Patient Active Problem List  Diagnoses  . Osteoarthritis, knee  . Obesity  . Hypertension  . Postop Hyponatremia    Past Medical History:  Past Medical History  Diagnosis Date  . Anxiety   . Bronchitis   . UTI (urinary tract infection)   . Colon polyps   . Hypertension   . Varicose veins     both legs  . Irritable bowel   . Arthritis   . Headache     tension, or allergy headaches  . Chronic kidney disease      kidney stones, passed on own  . Tinnitus     both ears most of the time   Past Surgical History:  Past Surgical History  Procedure Date  . Foot surgery many yrs ago    both feet  . Abdominal hysterectomy 02-09-2010    TAH    PT Assessment/Plan/Recommendation PT Assessment Clinical Impression Statement: Pt presents s/p L TKA POD 1 with decreased strength, ROM, and mobility.  Pt tolerated bed mobility and ambulation very well and motivated to do well.  Pt will benefit from skilled PT in acute venue in order to address deficits.  PT recommends HHPT for follow up thearpy at D/C to return pt to prior level of functioning.  PT Recommendation/Assessment: Patient will need skilled PT in the acute care venue PT Problem List: Decreased strength;Decreased range of motion;Decreased activity tolerance;Decreased balance;Decreased mobility;Decreased knowledge of use of DME;Pain Barriers to Discharge: None PT Therapy Diagnosis : Difficulty walking;Abnormality of gait;Generalized weakness;Acute pain PT Plan PT Frequency: 7X/week PT Treatment/Interventions: DME instruction;Gait training;Stair training;Functional mobility training;Therapeutic activities;Therapeutic exercise;Patient/family education PT Recommendation Recommendations for Other Services: OT consult Follow Up Recommendations: Home health PT Equipment Recommended: None recommended  by PT PT Goals  Acute Rehab PT Goals PT Goal Formulation: With patient Time For Goal Achievement: 4 days Pt will go Supine/Side to Sit: with supervision PT Goal: Supine/Side to Sit - Progress: Goal set today Pt will go Sit to Supine/Side: with supervision PT Goal: Sit to Supine/Side - Progress: Goal set today Pt will go Sit to Stand: with modified independence PT Goal: Sit to Stand - Progress: Goal set today Pt will go Stand to Sit: with modified independence PT Goal: Stand to Sit - Progress: Goal set today Pt will Ambulate: 51 - 150 feet;with modified independence;with least restrictive assistive device PT Goal: Ambulate - Progress: Goal set today Pt will Go Up / Down Stairs: 3-5 stairs;with supervision;with least restrictive assistive device PT Goal: Up/Down Stairs - Progress: Goal set today Pt will Perform Home Exercise Program: Independently PT Goal: Perform Home Exercise Program - Progress: Goal set today  PT Evaluation Precautions/Restrictions  Precautions Precautions: Knee Required Braces or Orthoses: Yes Knee Immobilizer: Discontinue once straight leg raise with < 10 degree lag Restrictions Weight Bearing Restrictions: Yes LLE Weight Bearing: Weight bearing as tolerated Prior Functioning  Home Living Lives With: Spouse;Family Receives Help From: Family Type of Home: House Home Layout: One level Home Access: Stairs to enter Entrance Stairs-Rails: None Entrance Stairs-Number of Steps: 3 Home Adaptive Equipment: Walker - rolling Additional Comments: Pt states she has raised toilet seats.  Has window seal and sink on side.  Prior Function Level of Independence: Independent with basic ADLs;Independent with gait;Independent with transfers Driving: Yes Vocation: Retired Financial risk analyst Arousal/Alertness: Awake/alert Overall Cognitive Status: Appears within functional limits for tasks assessed Orientation Level:  Oriented X4 Sensation/Coordination Sensation Light  Touch: Appears Intact Coordination Gross Motor Movements are Fluid and Coordinated: Yes Extremity Assessment RLE Assessment RLE Assessment: Exceptions to [facility] RLE Strength RLE Overall Strength Comments: Grossly WFL per functional assessment.  LLE Assessment LLE Assessment: Exceptions to [facility] LLE Strength LLE Overall Strength Comments: ankle DF/PF 5/5.  Pt can do single SLR against gravity, however with increased c/o pain.  Mobility (including Balance) Bed Mobility Bed Mobility: Yes Supine to Sit: 4: Min assist Supine to Sit Details (indicate cue type and reason): Requires some assist for LLE off of bed.  Pt demos good use of UE to self assist.  Transfers Transfers: Yes Sit to Stand: 4: Min assist;From elevated surface;With upper extremity assist;From bed Sit to Stand Details (indicate cue type and reason): Min/guard for safety with cues for hand placement and LLE management once in standing.  Stand to Sit: 4: Min assist;With upper extremity assist;With armrests;To chair/3-in-1 Stand to Sit Details: cues for hand placement and LLE management.  Ambulation/Gait Ambulation/Gait: Yes Ambulation/Gait Assistance: 4: Min assist Ambulation/Gait Assistance Details (indicate cue type and reason): Requires cues for sequencing/technique with RW, some increased assist when making turns for safety.   Ambulation Distance (Feet): 80 Feet Assistive device: Rolling walker Gait Pattern: Step-to pattern;Step-through pattern Gait velocity: decreased    Exercise    End of Session PT - End of Session Equipment Utilized During Treatment: Left knee immobilizer Activity Tolerance: Patient tolerated treatment well Patient left: in chair;with call bell in reach;with family/visitor present Nurse Communication: Mobility status for transfers;Mobility status for ambulation General Behavior During Session: [facility] for tasks performed Cognition: [facility] for tasks performed  [name], Meribeth Mattes 11/06/2011, 10:18 AM

## 2011-11-06 NOTE — Evaluation (Signed)
Occupational Therapy Evaluation Patient Details Name: Carla Schmitt MRN: [id number] DOB: [birth year] Today's Date: 11/06/2011  Problem List:  Patient Active Problem List  Diagnoses  . Osteoarthritis, knee  . Obesity  . Hypertension  . Postop Hyponatremia    Past Medical History:  Past Medical History  Diagnosis Date  . Anxiety   . Bronchitis   . UTI (urinary tract infection)   . Colon polyps   . Hypertension   . Varicose veins     both legs  . Irritable bowel   . Arthritis   . Headache     tension, or allergy headaches  . Chronic kidney disease      kidney stones, passed on own  . Tinnitus     both ears most of the time   Past Surgical History:  Past Surgical History  Procedure Date  . Foot surgery many yrs ago    both feet  . Abdominal hysterectomy 02-09-2010    TAH    OT Assessment/Plan/Recommendation OT Assessment Clinical Impression Statement: Pt mobilizing well POD#1 TKR. All education completed. Pt will have necessary level of A from family upon d/c. OT Recommendation/Assessment: Patient does not need any further OT services OT Recommendation Follow Up Recommendations: No OT follow up Equipment Recommended: 3 in 1 bedside comode OT Goals    OT Evaluation Precautions/Restrictions  Precautions Precautions: Knee Required Braces or Orthoses: Yes Knee Immobilizer: Discontinue once straight leg raise with < 10 degree lag Restrictions Weight Bearing Restrictions: No LLE Weight Bearing: Weight bearing as tolerated Prior Functioning Home Living Lives With: Spouse;Family Receives Help From: Family Type of Home: House Home Layout: One level Home Access: Stairs to enter Entrance Stairs-Rails: None Entrance Stairs-Number of Steps: 3 Bathroom Shower/Tub: Engineer, manufacturing systems: Standard Home Adaptive Equipment: Walker - rolling Prior Function Level of Independence: Independent with basic ADLs;Independent with homemaking with  ambulation;Independent with transfers;Independent with gait Driving: Yes Vocation: Retired ADL ADL Grooming: Simulated;Supervision/safety Where Assessed - Grooming: Standing at sink Upper Body Bathing: Simulated;Set up Where Assessed - Upper Body Bathing: Standing at sink Lower Body Bathing: Simulated;Minimal assistance Where Assessed - Lower Body Bathing: Sit to stand from bed Upper Body Dressing: Simulated;Set up Where Assessed - Upper Body Dressing: Standing Lower Body Dressing: Simulated;Minimal assistance Where Assessed - Lower Body Dressing: Sit to stand from bed Toilet Transfer: Performed;Supervision/safety Toilet Transfer Method: Ambulating Toilet Transfer Equipment: Raised toilet seat with arms (or 3-in-1 over toilet) Toileting - Clothing Manipulation: Simulated;Supervision/safety Where Assessed - Toileting Clothing Manipulation: Sit to stand from 3-in-1 or toilet Toileting - Hygiene: Simulated;Supervision/safety Where Assessed - Toileting Hygiene: Sit to stand from 3-in-1 or toilet Tub/Shower Transfer: Not assessed Tub/Shower Transfer Method: Not assessed ADL Comments: Pt stated she would be spongebathing until able to step into the tub. Vision/Perception    Cognition Cognition Arousal/Alertness: Awake/alert Overall Cognitive Status: Appears within functional limits for tasks assessed Orientation Level: Oriented X4 Sensation/Coordination   Extremity Assessment RUE Assessment RUE Assessment: Within Functional Limits LUE Assessment LUE Assessment: Within Functional Limits Mobility  Bed Mobility Bed Mobility: Yes Supine to Sit: 4: Min assist;HOB elevated (Comment degrees);With rails Sit to Supine: 4: Min assist;HOB elevated (comment degrees);With rail Sit to Supine - Details (indicate cue type and reason): Min A for LLE into bed.  Pt demos good technique with UE to self assist and adjust self in bed.  Transfers Sit to Stand: 4: Min assist;From elevated  surface;From chair/3-in-1;From bed;With upper extremity assist Sit to Stand Details (indicate cue type and  reason): Min/guard for safety with cues for hand placement.  Stand to Sit: 5: Supervision;With upper extremity assist;To bed;To chair/3-in-1 Stand to Sit Details: Supervision for safety with cues for hand placement and LLE management.  Exercises  End of Session OT - End of Session Activity Tolerance: Patient tolerated treatment well Patient left: in bed;with call bell in reach;with family/visitor present General Behavior During Session: [facility] for tasks performed Cognition: [facility] for tasks performed   Suresh Audi A OTR/L [phone] 11/06/2011, 3:30 PM

## 2011-11-07 DIAGNOSIS — E876 Hypokalemia: Secondary | ICD-10-CM | POA: Diagnosis not present

## 2011-11-07 DIAGNOSIS — D62 Acute posthemorrhagic anemia: Secondary | ICD-10-CM | POA: Diagnosis not present

## 2011-11-07 LAB — BASIC METABOLIC PANEL
BUN: 10 mg/dL (ref 6–23)
Calcium: 8.7 mg/dL (ref 8.4–10.5)
GFR calc Af Amer: >90 mL/min (ref >90)
GFR calc non Af Amer: 89 mL/min — ABNORMAL LOW (ref >90)
Potassium: 3.1 mEq/L — ABNORMAL LOW (ref 3.5–5.1)
Sodium: 133 mEq/L — ABNORMAL LOW (ref 135–145)

## 2011-11-07 LAB — CBC
HCT: 28.6 % — ABNORMAL LOW (ref 36.0–46.0)
MCH: 27.4 pg (ref 26.0–34.0)
MCHC: 33.2 g/dL (ref 30.0–36.0)
RDW: 14.7 % (ref 11.5–15.5)

## 2011-11-07 MED ORDER — POTASSIUM CHLORIDE CRYS ER 20 MEQ PO TBCR
40.0000 meq | EXTENDED_RELEASE_TABLET | Freq: Three times a day (TID) | ORAL | Status: AC
  Administered 2011-11-07 (×3): 40 meq via ORAL
  Filled 2011-11-07 (×3): qty 2

## 2011-11-07 NOTE — Progress Notes (Signed)
Subjective: 2 Days Post-Op Procedure(s) (LRB): TOTAL KNEE ARTHROPLASTY (Left) Patient reports pain as mild.   Patient seen in rounds with Dr. Lequita Halt. Patient has complaints of some pain but doing OK.  Objective: Vital signs in last 24 hours: Temp:  [98.3 F (36.8 C)-99.8 F (37.7 C)] 98.8 F (37.1 C) (03/06 0507) Pulse Rate:  [93-104] 104  (03/06 0507) Resp:  [16] 16  (03/06 0507) BP: (140-177)/(69-84) 140/83 mmHg (03/06 1021) SpO2:  [96 %-99 %] 96 % (03/06 0507)  Intake/Output from previous day:  Intake/Output Summary (Last 24 hours) at 11/07/11 1205 Last data filed at 11/07/11 1104  Gross per 24 hour  Intake   1910 ml  Output   2975 ml  Net  -1065 ml    Intake/Output this shift: Total I/O In: 278 [P.O.:240; I.V.:38] Out: 750 [Urine:750]  Labs:  [facility] 11/07/11 0418 11/06/11 0414  HGB 9.5* 10.4*    Basename 11/07/11 0418 11/06/11 0414  WBC 12.5* 9.5  RBC 3.47* 3.53*  HCT 28.6* 29.2*  PLT 239 237    Basename 11/07/11 0418 11/06/11 0414  NA 133* 134*  K 3.1* 3.5  CL 95* 98  CO2 31 29  BUN 10 12  CREATININE 0.73 0.68  GLUCOSE 142* 139*  CALCIUM 8.7 8.6   No results found for this basename: LABPT:2,INR:2 in the last 72 hours  Exam - Neurovascular intact Sensation intact distally Dressing/Incision - clean, dry, no drainage Motor function intact - moving foot and toes well on exam.   Past Medical History  Diagnosis Date  . Anxiety   . Bronchitis   . UTI (urinary tract infection)   . Colon polyps   . Hypertension   . Varicose veins     both legs  . Irritable bowel   . Arthritis   . Headache     tension, or allergy headaches  . Chronic kidney disease      kidney stones, passed on own  . Tinnitus     both ears most of the time    Assessment/Plan: 2 Days Post-Op Procedure(s) (LRB): TOTAL KNEE ARTHROPLASTY (Left) Active Problems:  Postop Hyponatremia  Postop Hypokalemia  Postop Acute blood loss anemia   Advance diet Up with  therapy Plan for discharge tomorrow Discharge home with home health  DVT Prophylaxis - Xarelto Protocol Weight-Bearing as tolerated to left leg  Carla Schmitt 11/07/2011, 12:05 PM

## 2011-11-07 NOTE — Progress Notes (Signed)
PT unable to see pt for am session due to low BP and nausea/dizziness.  Will check back on for afternoon session.  Thanks, Clovia Cuff, PT

## 2011-11-07 NOTE — Progress Notes (Signed)
CARE MANAGEMENT NOTE 11/07/2011  Patient:  Carla Schmitt, Carla Schmitt   Account Number:  [id number]  Date Initiated:  11/06/2011  Documentation initiated by:  Colleen Can  Subjective/Objective Assessment:   dx osteoarthritis right knee; total knee replacemnt     Action/Plan:   CM spoke with patient and sister. Plans are for patient to return to her home in [place] where her sister will be caregiver. States she will need rw, 3n1. wants agency that is in network for [facility] services   Anticipated DC Date:  11/08/2011   Anticipated DC Plan:  HOME W HOME HEALTH SERVICES  In-house referral  NA      DC Planning Services  CM consult      [facility] Choice  HOME HEALTH  DURABLE MEDICAL EQUIPMENT   Choice offered to / List presented to:  C-1 Patient   DME arranged  3-N-1  Levan Hurst      DME agency  [facility]     HH arranged  HH-2 PT      [facility] agency  [facility]   Status of service:  In process, will continue to follow Comments:  11/07/2011 Raynelle Bring BSN CCM [phone] [facility] called and can provide HHPT in [place]. Face sheet, h&P, op note, PT notes and orders faxed to [phone] with confirmation. [facility] notified of DME needs.

## 2011-11-07 NOTE — Progress Notes (Signed)
PT Cancellation Note  Treatment cancelled today due to medical issues with patient which prohibited therapy.  Pt states she has she still doesn't feel well and has a fever. Will check back tomorrow.   Page, Meribeth Mattes 11/07/2011, 3:20 PM

## 2011-11-07 NOTE — Progress Notes (Signed)
11/07/2011  Pt states she already has RW and does not want [facility]SC because insurance will not cover. Raynelle Bring BSN CCM

## 2011-11-08 LAB — CBC
Hemoglobin: 10.1 g/dL — ABNORMAL LOW (ref 12.0–15.0)
MCH: 27.8 pg (ref 26.0–34.0)
MCHC: 33.2 g/dL (ref 30.0–36.0)
Platelets: 257 10*3/uL (ref 150–400)

## 2011-11-08 LAB — BASIC METABOLIC PANEL
Calcium: 9.2 mg/dL (ref 8.4–10.5)
GFR calc Af Amer: >90 mL/min (ref >90)
GFR calc non Af Amer: 88 mL/min — ABNORMAL LOW (ref >90)
Glucose, Bld: 113 mg/dL — ABNORMAL HIGH (ref 70–99)
Potassium: 3.7 mEq/L (ref 3.5–5.1)
Sodium: 138 mEq/L (ref 135–145)

## 2011-11-08 MED ORDER — RIVAROXABAN 10 MG PO TABS: 10.0000 mg | ORAL_TABLET | Freq: Every day | ORAL | Status: DC

## 2011-11-08 MED ORDER — METHOCARBAMOL 500 MG PO TABS: 500.0000 mg | ORAL_TABLET | Freq: Four times a day (QID) | ORAL | Status: AC | PRN

## 2011-11-08 MED ORDER — OXYCODONE HCL 5 MG PO TABS: 5.0000 mg | ORAL_TABLET | ORAL | Status: AC | PRN

## 2011-11-08 NOTE — Progress Notes (Signed)
Physical Therapy Treatment Patient Details Name: Carla Schmitt MRN: [id number] DOB: 01-Jan-1946 Today's Date: 11/08/2011  PT Assessment/Plan  PT - Assessment/Plan Comments on Treatment Session: Pt somewhat agitated that PT had to see her twice before D/C.  Explained to pt why two good sessions were beneficial for safety.  Will see again in pm and D/C home.  PT Plan: Discharge plan remains appropriate PT Frequency: 7X/week Follow Up Recommendations: Home health PT Equipment Recommended: None recommended by PT PT Goals  Acute Rehab PT Goals PT Goal Formulation: With patient Time For Goal Achievement: 4 days Pt will go Sit to Stand: with modified independence PT Goal: Sit to Stand - Progress: Progressing toward goal Pt will go Stand to Sit: with modified independence PT Goal: Stand to Sit - Progress: Met Pt will Ambulate: 51 - 150 feet;with modified independence;with least restrictive assistive device PT Goal: Ambulate - Progress: Met Pt will Go Up / Down Stairs: 3-5 stairs;with supervision;with least restrictive assistive device PT Goal: Up/Down Stairs - Progress: Progressing toward goal  PT Treatment Precautions/Restrictions  Precautions Precautions: Knee Required Braces or Orthoses: Yes Knee Immobilizer: Discontinue once straight leg raise with < 10 degree lag Restrictions Weight Bearing Restrictions: No LLE Weight Bearing: Weight bearing as tolerated Mobility (including Balance) Bed Mobility Bed Mobility: No Transfers Transfers: Yes Sit to Stand: 5: Supervision;With upper extremity assist;With armrests;From chair/3-in-1 Sit to Stand Details (indicate cue type and reason): Supervision for safety.  Stand to Sit: 6: Modified independent (Device/Increase time);With upper extremity assist;With armrests;To chair/3-in-1 Stand to Sit Details: Pt demos good technique.  Ambulation/Gait Ambulation/Gait: Yes Ambulation/Gait Assistance: 6: Modified independent (Device/Increase  time) Ambulation/Gait Assistance Details (indicate cue type and reason): Pt demos good step through technique.  Ambulation Distance (Feet): 100 Feet Assistive device: Rolling walker Gait Pattern: Step-through pattern Gait velocity: WFL Stairs: Yes Stairs Assistance: 4: Min assist Stairs Assistance Details (indicate cue type and reason): Min/guard for safety with cues for sequencing/technique.  Stair Management Technique: No rails;Step to pattern;Backwards;Forwards;With walker Number of Stairs: 2  Height of Stairs: 6     Exercise    End of Session PT - End of Session Activity Tolerance: Patient tolerated treatment well Patient left: in chair;with call bell in reach;with family/visitor present General Behavior During Session: [facility] for tasks performed Cognition: [facility] for tasks performed  Page, Meribeth Mattes 11/08/2011, 11:25 AM

## 2011-11-08 NOTE — Discharge Summary (Signed)
Physician Discharge Summary   Patient ID: Carla Schmitt MRN: [id number] DOB/AGE: August 26, 1946 66 y.o.  Admit date: 11/05/2011 Discharge date: 11/08/2011  Primary Diagnosis: Osteoarthritis Left Knee  Admission Diagnoses: Past Medical History  Diagnosis Date  . Anxiety   . Bronchitis   . UTI (urinary tract infection)   . Colon polyps   . Hypertension   . Varicose veins     both legs  . Irritable bowel   . Arthritis   . Headache     tension, or allergy headaches  . Chronic kidney disease      kidney stones, passed on own  . Tinnitus     both ears most of the time    Discharge Diagnoses:  Active Problems:  Postop Hyponatremia  Postop Hypokalemia  Postop Acute blood loss anemia   Procedure: Procedure(s) (LRB): TOTAL KNEE ARTHROPLASTY (Left)   Consults: None  HPI: Carla Schmitt is a 66 y.o. year old female with end stage OA of her left knee with progressively worsening pain and dysfunction. She has constant pain, with activity and at rest and significant functional deficits with difficulties even with ADLs. She has had extensive non-op management including analgesics, injections of cortisone and viscosupplements, and home exercise program, but remains in significant pain with significant dysfunction. Radiographs show bone on bone arthritis all 3 compartments with medial tibial erosion. She presents now for left Total Knee Arthroplasty.   Laboratory Data: Hospital Outpatient Visit on 10/29/2011  Component Date Value Range Status  . aPTT (seconds) 10/29/2011 38* 24-37 Final   Comment:                                 IF BASELINE aPTT IS ELEVATED,                          SUGGEST PATIENT RISK ASSESSMENT                          BE USED TO DETERMINE APPROPRIATE                          ANTICOAGULANT THERAPY.  . WBC (K/uL) 10/29/2011 7.0  4.0-10.5 Final  . RBC (MIL/uL) 10/29/2011 4.43  3.87-5.11 Final  . Hemoglobin (g/dL) 40/98/1191 47.8  29.5-62.1 Final  . HCT (%) 10/29/2011  36.8  36.0-46.0 Final  . MCV (fL) 10/29/2011 83.1  78.0-100.0 Final  . MCH (pg) 10/29/2011 27.8  26.0-34.0 Final  . MCHC (g/dL) 30/86/5784 69.6  29.5-28.4 Final  . RDW (%) 10/29/2011 14.7  11.5-15.5 Final  . Platelets (K/uL) 10/29/2011 317  150-400 Final  . Sodium (mEq/L) 10/29/2011 142  135-145 Final  . Potassium (mEq/L) 10/29/2011 3.3* 3.5-5.1 Final  . Chloride (mEq/L) 10/29/2011 103  96-112 Final  . CO2 (mEq/L) 10/29/2011 30  19-32 Final  . Glucose, Bld (mg/dL) 13/24/4010 97  27-25 Final  . BUN (mg/dL) 36/64/4034 17  7-42 Final  . Creatinine, Ser (mg/dL) 59/56/3875 6.43  3.29-5.18 Final  . Calcium (mg/dL) 84/16/6063 01.6  0.1-09.3 Final  . Total Protein (g/dL) 23/55/7322 8.0  0.2-5.4 Final  . Albumin (g/dL) 27/02/2375 4.1  2.8-3.1 Final  . AST (U/L) 10/29/2011 17  0-37 Final  . ALT (U/L) 10/29/2011 12  0-35 Final  . Alkaline Phosphatase (U/L) 10/29/2011 83  39-117 Final  . Total Bilirubin (mg/dL) 51/76/1607  0.4  0.3-1.2 Final  . GFR calc non Af Amer (mL/min) 10/29/2011 >90  >90 Final  . GFR calc Af Amer (mL/min) 10/29/2011 >90  >90 Final   Comment:                                 The eGFR has been calculated                          using the CKD EPI equation.                          This calculation has not been                          validated in all clinical                          situations.                          eGFR's persistently                          <90 mL/min signify                          possible Chronic Kidney Disease.  Marland Kitchen Prothrombin Time (seconds) 10/29/2011 13.7  11.6-15.2 Final  . INR  10/29/2011 1.03  0.00-1.49 Final  . Color, Urine  10/29/2011 YELLOW  YELLOW Final  . APPearance  10/29/2011 CLEAR  CLEAR Final  . Specific Gravity, Urine  10/29/2011 1.021  1.005-1.030 Final  . pH  10/29/2011 6.0  5.0-8.0 Final  . Glucose, UA (mg/dL) 78/29/5621 NEGATIVE  NEGATIVE Final  . Hgb urine dipstick  10/29/2011 NEGATIVE  NEGATIVE Final  . Bilirubin Urine   10/29/2011 NEGATIVE  NEGATIVE Final  . Ketones, ur (mg/dL) 30/86/5784 NEGATIVE  NEGATIVE Final  . Protein, ur (mg/dL) 69/62/9528 NEGATIVE  NEGATIVE Final  . Urobilinogen, UA (mg/dL) 41/32/4401 0.2  0.2-7.2 Final  . Nitrite  10/29/2011 NEGATIVE  NEGATIVE Final  . Leukocytes, UA  10/29/2011 NEGATIVE  NEGATIVE Final   MICROSCOPIC NOT DONE ON URINES WITH NEGATIVE PROTEIN, BLOOD, LEUKOCYTES, NITRITE, OR GLUCOSE <1000 mg/dL.  Marland Kitchen MRSA, PCR  10/29/2011 NEGATIVE  NEGATIVE Final  . Staphylococcus aureus  10/29/2011 NEGATIVE  NEGATIVE Final   Comment:                                 The Xpert SA Assay (FDA                          approved for NASAL specimens                          only), is one component of                          a comprehensive surveillance                          program.  It is  not intended                          to diagnose infection nor to                          guide or monitor treatment.    Basename 11/08/11 0437 11/07/11 0418 11/06/11 0414  HGB 10.1* 9.5* 10.4*    Basename 11/08/11 0437 11/07/11 0418  WBC 14.5* 12.5*  RBC 3.63* 3.47*  HCT 30.4* 28.6*  PLT 257 239    Basename 11/08/11 0437 11/07/11 0418  NA 138 133*  K 3.7 3.1*  CL 98 95*  CO2 30 31  BUN 9 10  CREATININE 0.76 0.73  GLUCOSE 113* 142*  CALCIUM 9.2 8.7   No results found for this basename: LABPT:2,INR:2 in the last 72 hours  X-Rays:Dg Chest 2 View  10/29/2011  *RADIOLOGY REPORT*  Clinical Data: Preop knee replacement surgery  CHEST - 2 VIEW  Comparison: None.  Findings: Heart size upper limits normal.  Lungs clear.  No effusion.  Spurring in the mid thoracic spine. Degenerative changes in the left shoulder.  IMPRESSION:  1.   No acute disease.  Original Report Authenticated By: Thora Lance III, M.D.    EKG: Orders placed in visit on 10/29/11  . EKG 12-LEAD     Hospital Course: Patient was admitted to [facility] and taken to the OR and underwent the above state  procedure without complications.  Patient tolerated the procedure well and was later transferred to the recovery room and then to the orthopaedic floor for postoperative care.  They were given PO and IV analgesics for pain control following their surgery.  They were given 24 hours of postoperative antibiotics and started on DVT prophylaxis.   PT and OT were ordered for total joint protocol.  Discharge planning consulted to help with postop disposition and equipment needs.  Patient had a decent night on the evening of surgery and started to get up with therapy on day one.  PCA was discontinued and they were weaned over to PO meds.  Hemovac drain was pulled without difficulty.  Physical Therapy Treatment cancelled on POD 2 due to medical issues with patient which prohibited therapy.  Dressing was changed on day two and the incision was healing well.  By day three, the patient was doing much better and anticipated that she would do well with herapy.  Incision was healing well.  Patient was seen in rounds and was ready to go home.  Discharge if does well with both sessions of therapy.  Discharge Medications: Prior to Admission medications   Medication Sig Start Date End Date Taking? Authorizing Provider  amLODipine (NORVASC) 5 MG tablet Take 5 mg by mouth every morning. 10/11/11 10/10/12 Yes Kristian Covey, MD  beclomethasone (BECONASE-AQ) 42 MCG/SPRAY nasal spray Place 2 sprays into the nose 2 (two) times daily. 09/27/11  Yes Kristian Covey, MD  hydrochlorothiazide (HYDRODIURIL) 25 MG tablet Take 12.5 mg by mouth every morning. 09/27/11  Yes Kristian Covey, MD  oxymetazoline (AFRIN) 0.05 % nasal spray Place 2 sprays into the nose as needed. [facility] nasal mist spray   Yes Historical Provider, MD  Propylene Glycol (SYSTANE BALANCE) 0.6 % SOLN Place 2 drops into both eyes 2 (two) times daily as needed. Dry eyes    Yes Historical Provider, MD  methocarbamol (ROBAXIN) 500 MG tablet Take 1 tablet (  500 mg total) by  mouth every 6 (six) hours as needed. 11/08/11 11/18/11  Maekayla Giorgio, PA  oxyCODONE (OXY IR/ROXICODONE) 5 MG immediate release tablet Take 1-2 tablets (5-10 mg total) by mouth every 4 (four) hours as needed for pain. 11/08/11 11/18/11  Keyri Salberg Julien Girt, PA  rivaroxaban (XARELTO) 10 MG TABS tablet Take 1 tablet (10 mg total) by mouth daily with breakfast. Take for two and a half more weeks and then discontinue. 11/08/11   Geneen Dieter Julien Girt, PA    Diet: regular Activity:WBAT Follow-up:in 2 weeks Disposition: Home if improved Discharged Condition: Pending at time of summary but home if improved with therapy.   Discharge Orders    Future Appointments: Provider: Department: Dept Phone: Center:   12/31/2011 10:00 AM Kristian Covey, MD [facility] [phone] [facility]     Future Orders Please Complete By Expires   Diet - low sodium heart healthy      Call MD / Call 911      Comments:   If you experience chest pain or shortness of breath, CALL 911 and be transported to the hospital emergency room.  If you develope a fever above 101 F, pus (white drainage) or increased drainage or redness at the wound, or calf pain, call your surgeon's office.   Constipation Prevention      Comments:   Drink plenty of fluids.  Prune juice may be helpful.  You may use a stool softener, such as Colace (over the counter) 100 mg twice a day.  Use MiraLax (over the counter) for constipation as needed.   Increase activity slowly as tolerated      Weight Bearing as taught in Physical Therapy      Comments:   Use a walker or crutches as instructed.   Discharge instructions      Comments:   Pick up stool softner and laxative for home. Do not submerge incision under water. May shower. Continue to use ice for pain and swelling from surgery. .    Driving restrictions      Comments:   No driving   Lifting restrictions      Comments:   No lifting   TED hose      Comments:   Use stockings (TED hose)  for 3 weeks on both leg(s).  You may remove them at night for sleeping.   Change dressing      Comments:   Change dressing  daily with sterile 4 x 4 inch gauze dressing and apply TED hose.   Do not put a pillow under the knee. Place it under the heel.        Medication List  As of 11/08/2011 10:14 AM   STOP taking these medications         cholecalciferol 1000 UNITS tablet      naproxen 500 MG tablet         TAKE these medications         amLODipine 5 MG tablet   Commonly known as: NORVASC   Take 5 mg by mouth every morning.      beclomethasone 42 MCG/SPRAY nasal spray   Commonly known as: BECONASE-AQ   Place 2 sprays into the nose 2 (two) times daily.      hydrochlorothiazide 25 MG tablet   Commonly known as: HYDRODIURIL   Take 12.5 mg by mouth every morning.      methocarbamol 500 MG tablet   Commonly known as: ROBAXIN   Take 1 tablet (500 mg total)  by mouth every 6 (six) hours as needed.      oxyCODONE 5 MG immediate release tablet   Commonly known as: Oxy IR/ROXICODONE   Take 1-2 tablets (5-10 mg total) by mouth every 4 (four) hours as needed for pain.      oxymetazoline 0.05 % nasal spray   Commonly known as: AFRIN   Place 2 sprays into the nose as needed. [facility] nasal mist spray      rivaroxaban 10 MG Tabs tablet   Commonly known as: XARELTO   Take 1 tablet (10 mg total) by mouth daily with breakfast. Take for two and a half more weeks and then discontinue.      SYSTANE BALANCE 0.6 % Soln   Generic drug: Propylene Glycol   Place 2 drops into both eyes 2 (two) times daily as needed. Dry eyes             Follow-up Information    Schedule an appointment as soon as possible for a visit in 2 weeks to follow up.         SignedPatrica Duel 11/08/2011, 10:14 AM

## 2011-11-08 NOTE — Discharge Instructions (Signed)
Knee Rehabilitation, Guidelines Following Surgery Results after knee surgery are often greatly improved when you follow the exercise, range of motion and muscle strengthening exercises prescribed by your doctor. Safety measures are also important to protect the knee from further injury. Any time any of these exercises cause you to have increased pain or swelling in your knee joint, decrease the amount until you are comfortable again and slowly increase them. If you have problems or questions, call your caregiver or physical therapist for advice. HOME CARE INSTRUCTIONS   Remove items at home which could result in a fall. This includes throw rugs or furniture in walking pathways.   Continue medications as instructed.   You may shower or take tub baths when your staples or stitches are removed or as instructed.   Walk using crutches or walker as instructed.   Put weight on your legs and walk as much as is comfortable.   You may resume a sexual relationship in one month or when given the OK by your doctor.   Return to work as instructed by your doctor.   Do not drive a car for 6 weeks or as instructed.   Wear elastic stockings until instructed not to.   Make sure you keep all of your appointments after your operation with all of your doctors and caregivers.  RANGE OF MOTION AND STRENGTHENING EXERCISES Rehabilitation of the knee is important following a knee injury or an operation. After just a few days of immobilization, the muscles of the thigh which control the knee become weakened and shrink (atrophy). Knee exercises are designed to build up the tone and strength of the thigh muscles and to improve knee motion. Often times heat used for twenty to thirty minutes before working out will loosen up your tissues and help with improving the range of motion. These exercises can be done on a training (exercise) mat, on the floor, on a table or on a bed. Use what ever works the best and is most  comfortable for you Knee exercises include:  Leg Lifts - While your knee is still immobilized in a splint or cast, you can do straight leg raises. Lift the leg to 60 degrees, hold for 3 sec, and slowly lower the leg. Repeat 10-20 times 2-3 times daily. Perform this exercise against resistance later as your knee gets better.   Quad and Hamstring Sets - Tighten up the muscle on the front of the thigh (Quad) and hold for 5-10 sec. Repeat this 10-20 times hourly. Hamstring sets are done by pushing the foot backward against an object and holding for 5-10 sec. Repeat as with quad sets.  A rehabilitation program following serious knee injuries can speed recovery and prevent re-injury in the future due to weakened muscles. Contact your doctor or a physical therapist for more information on knee rehabilitation. MAKE SURE YOU:   Understand these instructions.   Will watch your condition.   Will get help right away if you are not doing well or get worse.  Document Released: 08/20/2005 Document Revised: 08/09/2011 Document Reviewed: 02/07/2007 [employer] Patient Information 2012 [employer].  Pick up stool softner and laxative for home. Do not submerge incision under water. May shower. Continue to use ice for pain and swelling from surgery.  

## 2011-11-08 NOTE — Progress Notes (Signed)
CARE MANAGEMENT NOTE 11/08/2011  Patient:  Carla Schmitt, Carla Schmitt   Account Number:  [id number]  Date Initiated:  11/06/2011  Documentation initiated by:  Colleen Can  Subjective/Objective Assessment:   dx osteoarthritis right knee; total knee replacemnt     Action/Plan:   CM spoke with patient and sister. Plans are for patient to return to her home in [place] where her sister will be caregiver. States she will need rw, 3n1. wants agency that is in network for [facility] services   Anticipated DC Date:  11/08/2011   Anticipated DC Plan:  HOME W HOME HEALTH SERVICES  In-house referral  NA      DC Planning Services  CM consult      [facility] Choice  HOME HEALTH  DURABLE MEDICAL EQUIPMENT   Choice offered to / List presented to:  C-1 Patient   DME arranged  NA      DME agency  NA     HH arranged  HH-2 PT      HH agency  [facility]   Status of service:  Completed, signed off Medicare Important Message given?  NO (If response is "NO", the following Medicare IM given date fields will be blank) Date Medicare IM given:   Date Additional Medicare IM given:    Discharge Disposition:  HOME W HOME HEALTH SERVICES  Per UR Regulation:    Comments:  11/08/2011 Raynelle Bring bsn ccm [phone] Pt for discharge today. [facility] notified of discharge. [facility] will provide HHpt with start date 11/09/2011. List of choice HH agencies placed in shadow chart.

## 2011-11-08 NOTE — Progress Notes (Signed)
Subjective: 3 Days Post-Op Procedure(s) (LRB): TOTAL KNEE ARTHROPLASTY (Left) Patient reports pain as mild and improving. Patient seen in rounds with Dr. Lequita Halt. Patient is felling better today.  Did not get to work with therapy yesterday.  She may be ready to go home today but will get two good therapy sessions in prior to going home.  If she does well, then home later today.  Objective: Vital signs in last 24 hours: Temp:  [99.3 F (37.4 C)-101 F (38.3 C)] 99.4 F (37.4 C) (03/07 0620) Pulse Rate:  [97-124] 97  (03/07 0620) Resp:  [16-18] 16  (03/07 0620) BP: (134-178)/(73-83) 134/73 mmHg (03/07 0620) SpO2:  [95 %-97 %] 95 % (03/07 0620)  Intake/Output from previous day:  Intake/Output Summary (Last 24 hours) at 11/08/11 0941 Last data filed at 11/08/11 0620  Gross per 24 hour  Intake    720 ml  Output    900 ml  Net   -180 ml    Intake/Output this shift:    Labs: Results for orders placed during the hospital encounter of 11/05/11  TYPE AND SCREEN      Component Value Range   ABO/RH(D) O POS     Antibody Screen NEG     Sample Expiration 11/08/2011    ABO/RH      Component Value Range   ABO/RH(D) O POS    CBC      Component Value Range   WBC 9.5  4.0 - 10.5 (K/uL)   RBC 3.53 (*) 3.87 - 5.11 (MIL/uL)   Hemoglobin 10.4 (*) 12.0 - 15.0 (g/dL)   HCT 16.1 (*) 09.6 - 46.0 (%)   MCV 82.7  78.0 - 100.0 (fL)   MCH 29.5  26.0 - 34.0 (pg)   MCHC 35.6  30.0 - 36.0 (g/dL)   RDW 04.5  40.9 - 81.1 (%)   Platelets 237  150 - 400 (K/uL)  BASIC METABOLIC PANEL      Component Value Range   Sodium 134 (*) 135 - 145 (mEq/L)   Potassium 3.5  3.5 - 5.1 (mEq/L)   Chloride 98  96 - 112 (mEq/L)   CO2 29  19 - 32 (mEq/L)   Glucose, Bld 139 (*) 70 - 99 (mg/dL)   BUN 12  6 - 23 (mg/dL)   Creatinine, Ser 9.14  0.50 - 1.10 (mg/dL)   Calcium 8.6  8.4 - 78.2 (mg/dL)   GFR calc non Af Amer >90  >90 (mL/min)   GFR calc Af Amer >90  >90 (mL/min)  CBC      Component Value Range   WBC  12.5 (*) 4.0 - 10.5 (K/uL)   RBC 3.47 (*) 3.87 - 5.11 (MIL/uL)   Hemoglobin 9.5 (*) 12.0 - 15.0 (g/dL)   HCT 95.6 (*) 21.3 - 46.0 (%)   MCV 82.4  78.0 - 100.0 (fL)   MCH 27.4  26.0 - 34.0 (pg)   MCHC 33.2  30.0 - 36.0 (g/dL)   RDW 08.6  57.8 - 46.9 (%)   Platelets 239  150 - 400 (K/uL)  BASIC METABOLIC PANEL      Component Value Range   Sodium 133 (*) 135 - 145 (mEq/L)   Potassium 3.1 (*) 3.5 - 5.1 (mEq/L)   Chloride 95 (*) 96 - 112 (mEq/L)   CO2 31  19 - 32 (mEq/L)   Glucose, Bld 142 (*) 70 - 99 (mg/dL)   BUN 10  6 - 23 (mg/dL)   Creatinine, Ser 6.29  0.50 -  1.10 (mg/dL)   Calcium 8.7  8.4 - 98.1 (mg/dL)   GFR calc non Af Amer 89 (*) >90 (mL/min)   GFR calc Af Amer >90  >90 (mL/min)  CBC      Component Value Range   WBC 14.5 (*) 4.0 - 10.5 (K/uL)   RBC 3.63 (*) 3.87 - 5.11 (MIL/uL)   Hemoglobin 10.1 (*) 12.0 - 15.0 (g/dL)   HCT 19.1 (*) 47.8 - 46.0 (%)   MCV 83.7  78.0 - 100.0 (fL)   MCH 27.8  26.0 - 34.0 (pg)   MCHC 33.2  30.0 - 36.0 (g/dL)   RDW 29.5  62.1 - 30.8 (%)   Platelets 257  150 - 400 (K/uL)  BASIC METABOLIC PANEL      Component Value Range   Sodium 138  135 - 145 (mEq/L)   Potassium 3.7  3.5 - 5.1 (mEq/L)   Chloride 98  96 - 112 (mEq/L)   CO2 30  19 - 32 (mEq/L)   Glucose, Bld 113 (*) 70 - 99 (mg/dL)   BUN 9  6 - 23 (mg/dL)   Creatinine, Ser 6.57  0.50 - 1.10 (mg/dL)   Calcium 9.2  8.4 - 84.6 (mg/dL)   GFR calc non Af Amer 88 (*) >90 (mL/min)   GFR calc Af Amer >90  >90 (mL/min)    Exam: Neurovascular intact Sensation intact distally Incision - clean, dry, no drainage Motor function intact - moving foot and toes well on exam.   Assessment/Plan: 3 Days Post-Op Procedure(s) (LRB): TOTAL KNEE ARTHROPLASTY (Left) Procedure(s) (LRB): TOTAL KNEE ARTHROPLASTY (Left) Past Medical History  Diagnosis Date  . Anxiety   . Bronchitis   . UTI (urinary tract infection)   . Colon polyps   . Hypertension   . Varicose veins     both legs  . Irritable bowel    . Arthritis   . Headache     tension, or allergy headaches  . Chronic kidney disease      kidney stones, passed on own  . Tinnitus     both ears most of the time   Active Problems:  Postop Hyponatremia  Postop Hypokalemia  Postop Acute blood loss anemia   Up with therapy Discharge home with home health if does well with both therapy sessions today. Diet - regular Follow up - in 2 weeks Activity - WBAT Condition Upon Discharge - Stable D/C Meds - See DC Summary DVT Prophylaxis - Xarelto Protocol   Tanner Vigna 11/08/2011, 9:41 AM

## 2011-11-08 NOTE — Progress Notes (Signed)
Physical Therapy Treatment Patient Details Name: Carla Schmitt MRN: [id number] DOB: 02-26-1946 Today's Date: 11/08/2011  PT Assessment/Plan  PT - Assessment/Plan Comments on Treatment Session: Pt agreed to perform exercises and states that she feels comfortable with stairs.  Educated husband on technique.  Pt refused a second ambulation and stair training.   PT Plan: Discharge plan remains appropriate PT Frequency: 7X/week Follow Up Recommendations: Home health PT Equipment Recommended: None recommended by PT PT Goals  Acute Rehab PT Goals PT Goal Formulation: With patient Time For Goal Achievement: 4 days Pt will go Sit to Stand: with modified independence PT Goal: Sit to Stand - Progress: Progressing toward goal Pt will go Stand to Sit: with modified independence PT Goal: Stand to Sit - Progress: Met Pt will Ambulate: 51 - 150 feet;with modified independence;with least restrictive assistive device PT Goal: Ambulate - Progress: Met Pt will Go Up / Down Stairs: 3-5 stairs;with supervision;with least restrictive assistive device PT Goal: Up/Down Stairs - Progress: Progressing toward goal Pt will Perform Home Exercise Program: Independently PT Goal: Perform Home Exercise Program - Progress: Partly met  PT Treatment Precautions/Restrictions  Precautions Precautions: Knee Required Braces or Orthoses: Yes Knee Immobilizer: Other (comment) (D/C'd KI during am, able to perform SLR 10x) Restrictions Weight Bearing Restrictions: No LLE Weight Bearing: Weight bearing as tolerated Mobility (including Balance) Bed Mobility Bed Mobility: No Transfers Transfers: Yes Sit to Stand: 5: Supervision;With upper extremity assist;With armrests;From chair/3-in-1 Sit to Stand Details (indicate cue type and reason): Supervision for safety.  Stand to Sit: 6: Modified independent (Device/Increase time);With upper extremity assist;With armrests;To chair/3-in-1 Stand to Sit Details: Pt demos good  technique.  Ambulation/Gait Ambulation/Gait: Yes Ambulation/Gait Assistance: 6: Modified independent (Device/Increase time) Ambulation/Gait Assistance Details (indicate cue type and reason): Pt demos good step through technique.  Ambulation Distance (Feet): 100 Feet Assistive device: Rolling walker Gait Pattern: Step-through pattern Gait velocity: WFL Stairs: Yes Stairs Assistance: 4: Min assist Stairs Assistance Details (indicate cue type and reason): Min/guard for safety with cues for sequencing/technique.  Stair Management Technique: No rails;Step to pattern;Backwards;Forwards;With walker Number of Stairs: 2  Height of Stairs: 6     Exercise  Total Joint Exercises Ankle Circles/Pumps: AROM;Both;20 reps;Seated Quad Sets: AROM;Left;10 reps;Seated Short Arc Quad: AROM;Left;10 reps;Seated Heel Slides: AAROM;Left;10 reps;Seated Hip ABduction/ADduction: AAROM;Left;10 reps;Seated Straight Leg Raises: AROM;AAROM;Left;10 reps;Seated End of Session PT - End of Session Activity Tolerance: Patient tolerated treatment well Patient left: in chair;with call bell in reach;with family/visitor present Nurse Communication: Other (comment) (RN aware pt ready for D/C. ) General Behavior During Session: [facility] for tasks performed Cognition: [facility] for tasks performed  Page, Meribeth Mattes 11/08/2011, 1:57 PM

## 2011-11-08 NOTE — Progress Notes (Signed)
Patient stable and ready for discharge S/P left total knee replacement. No C/O of pain at present. Patient/Husband given prescriptions and discharge instructions. Patient verbalized understanding of instructions. Patient d/c'd via wheelchair with husband.

## 2011-11-19 ENCOUNTER — Encounter (HOSPITAL_COMMUNITY): Payer: Self-pay | Admitting: Orthopedic Surgery

## 2011-12-31 ENCOUNTER — Encounter: Payer: Self-pay | Admitting: Family Medicine

## 2011-12-31 ENCOUNTER — Ambulatory Visit (INDEPENDENT_AMBULATORY_CARE_PROVIDER_SITE_OTHER): Payer: 59 | Admitting: Family Medicine

## 2011-12-31 VITALS — BP 142/82 | Temp 98.1°F | Wt 220.0 lb

## 2011-12-31 DIAGNOSIS — I10 Essential (primary) hypertension: Secondary | ICD-10-CM

## 2011-12-31 MED ORDER — AMLODIPINE BESYLATE 5 MG PO TABS: 5.0000 mg | ORAL_TABLET | Freq: Every morning | ORAL | Status: DC

## 2011-12-31 MED ORDER — HYDROCHLOROTHIAZIDE 25 MG PO TABS: 12.5000 mg | ORAL_TABLET | Freq: Every morning | ORAL | Status: DC

## 2011-12-31 NOTE — Patient Instructions (Signed)
Continue with weight loss efforts.  

## 2011-12-31 NOTE — Progress Notes (Signed)
  Subjective:    Patient ID: Carla Schmitt, female    DOB: 14-Mar-1946, 66 y.o.   MRN: [id number]  HPI  Followup hypertension. Patient had recent left total knee replacement. Uncomplicated. Still has some postoperative pain but improving. Remains on amlodipine and hydrochlorothiazide. Actively engaged in physical therapy. She took blood thinner for one month following her surgery and is off at this point. She has lost 20 pounds since last October and about 15 pounds since her surgery. She has had poor appetite secondary to pain. Overall feels well. No leg edema. Blood pressures at home mostly around 140 systolic and less than 80 diastolic.  She had mild anemia following surgery but no orthostasis.  Past Medical History  Diagnosis Date  . Anxiety   . Bronchitis   . UTI (urinary tract infection)   . Colon polyps   . Hypertension   . Varicose veins     both legs  . Irritable bowel   . Arthritis   . Headache     tension, or allergy headaches  . Chronic kidney disease      kidney stones, passed on own  . Tinnitus     both ears most of the time   Past Surgical History  Procedure Date  . Foot surgery many yrs ago    both feet  . Abdominal hysterectomy 02-09-2010    TAH  . Total knee arthroplasty 11/05/2011    Procedure: TOTAL KNEE ARTHROPLASTY;  Surgeon: Loanne Drilling, MD;  Location: [facility];  Service: Orthopedics;  Laterality: Left;    reports that she quit smoking about 20 years ago. Her smoking use included Cigarettes. She has a 18 pack-year smoking history. She has never used smokeless tobacco. She reports that she does not drink alcohol or use illicit drugs. family history includes Arthritis in her other; Heart disease in her other; and Stroke in her other. Allergies  Allergen Reactions  . Cefuroxime Axetil      ceftin=hives      Review of Systems  Constitutional: Negative for fever and chills.  Respiratory: Negative for cough and shortness of breath.   Cardiovascular:  Negative for chest pain and leg swelling.  Neurological: Negative for dizziness and headaches.       Objective:   Physical Exam  Constitutional: She is oriented to person, place, and time. She appears well-developed and well-nourished.  Cardiovascular: Normal rate and regular rhythm.  Exam reveals no gallop.   Pulmonary/Chest: Effort normal and breath sounds normal. No respiratory distress. She has no wheezes. She has no rales.  Musculoskeletal: She exhibits no edema.  Neurological: She is alert and oriented to person, place, and time.          Assessment & Plan:  Hypertension. Improving. Continue weight loss efforts. Refilled medications for one year. Followup in 4 months. Repeat hemoglobin and basic metabolic panel then.

## 2012-05-01 ENCOUNTER — Encounter: Payer: Self-pay | Admitting: Family Medicine

## 2012-05-01 ENCOUNTER — Ambulatory Visit (INDEPENDENT_AMBULATORY_CARE_PROVIDER_SITE_OTHER): Payer: 59 | Admitting: Family Medicine

## 2012-05-01 VITALS — BP 130/60 | Temp 98.3°F | Wt 220.0 lb

## 2012-05-01 DIAGNOSIS — D62 Acute posthemorrhagic anemia: Secondary | ICD-10-CM

## 2012-05-01 DIAGNOSIS — I10 Essential (primary) hypertension: Secondary | ICD-10-CM

## 2012-05-01 DIAGNOSIS — IMO0002 Reserved for concepts with insufficient information to code with codable children: Secondary | ICD-10-CM

## 2012-05-01 DIAGNOSIS — M1710 Unilateral primary osteoarthritis, unspecified knee: Secondary | ICD-10-CM

## 2012-05-01 LAB — BASIC METABOLIC PANEL
BUN: 18 mg/dL (ref 6–23)
Creatinine, Ser: 0.7 mg/dL (ref 0.4–1.2)
GFR: 111.99 mL/min (ref >60.00)
Potassium: 3.5 mEq/L (ref 3.5–5.1)

## 2012-05-01 LAB — CBC WITH DIFFERENTIAL/PLATELET
Basophils Relative: 0.3 % (ref 0.0–3.0)
Eosinophils Relative: 5.5 % — ABNORMAL HIGH (ref 0.0–5.0)
HCT: 37.8 % (ref 36.0–46.0)
Monocytes Relative: 4.9 % (ref 3.0–12.0)
Neutrophils Relative %: 64.5 % (ref 43.0–77.0)
Platelets: 317 10*3/uL (ref 150.0–400.0)
RBC: 4.46 Mil/uL (ref 3.87–5.11)
WBC: 6.7 10*3/uL (ref 4.5–10.5)

## 2012-05-01 NOTE — Progress Notes (Signed)
  Subjective:    Patient ID: Carla Schmitt, female    DOB: 1946-03-27, 66 y.o.   MRN: [id number]  HPI  Medical followup. Patient history of hypertension, obesity, osteoarthritis. She continues to recover from recent left total knee replacement. Ambulating well. Struggling with any further weight loss. Weight unchanged last visit. She remains on amlodipine and low-dose HCTZ. She's had some recent mild fluid retention in her ankles and legs. No dyspnea. No orthopnea.  Postoperative anemia. Recent hemoglobin 10.1. Not taking iron supplement but good dietary intake of iron. History of prediabetes range blood sugar. No symptoms of hyperglycemia.  Past Medical History  Diagnosis Date  . Anxiety   . Bronchitis   . UTI (urinary tract infection)   . Colon polyps   . Hypertension   . Varicose veins     both legs  . Irritable bowel   . Arthritis   . Headache     tension, or allergy headaches  . Chronic kidney disease      kidney stones, passed on own  . Tinnitus     both ears most of the time   Past Surgical History  Procedure Date  . Foot surgery many yrs ago    both feet  . Abdominal hysterectomy 02-09-2010    TAH  . Total knee arthroplasty 11/05/2011    Procedure: TOTAL KNEE ARTHROPLASTY;  Surgeon: Loanne Drilling, MD;  Location: [facility];  Service: Orthopedics;  Laterality: Left;    reports that she quit smoking about 20 years ago. Her smoking use included Cigarettes. She has a 18 pack-year smoking history. She has never used smokeless tobacco. She reports that she does not drink alcohol or use illicit drugs. family history includes Arthritis in her other; Heart disease in her other; and Stroke in her other. Allergies  Allergen Reactions  . Cefuroxime Axetil      ceftin=hives      Review of Systems  Constitutional: Negative for appetite change and fatigue.  Eyes: Negative for visual disturbance.  Respiratory: Negative for cough, chest tightness, shortness of breath and wheezing.     Cardiovascular: Positive for leg swelling. Negative for chest pain and palpitations.  Neurological: Negative for dizziness, seizures, syncope, weakness, light-headedness and headaches.       Objective:   Physical Exam  Constitutional: She appears well-developed and well-nourished.  Neck: Neck supple. No thyromegaly present.  Cardiovascular: Normal rate and regular rhythm.   Pulmonary/Chest: Effort normal and breath sounds normal. No respiratory distress. She has no wheezes. She has no rales.  Musculoskeletal:       Trace edema legs bilaterally          Assessment & Plan:  #1 hypertension stable continue current medications #2 trace edema legs. Likely related to amlodipine. Symptoms are relatively mild. Recommend frequent leg elevation. Continue HCTZ low-dose  #3 history of postoperative anemia. Recheck CBC. Iron supplement if hemoglobin still low #4 history of prediabetes. Recheck fasting blood sugar. Continue weight loss efforts

## 2012-05-02 NOTE — Progress Notes (Signed)
Quick Note:  Left a message for pt to return call. ______ 

## 2012-05-06 NOTE — Progress Notes (Signed)
Quick Note:  Pt informed on VM ______ 

## 2012-05-23 ENCOUNTER — Ambulatory Visit (INDEPENDENT_AMBULATORY_CARE_PROVIDER_SITE_OTHER): Payer: 59

## 2012-05-23 DIAGNOSIS — Z23 Encounter for immunization: Secondary | ICD-10-CM

## 2012-09-30 ENCOUNTER — Other Ambulatory Visit: Payer: Self-pay | Admitting: *Deleted

## 2012-09-30 MED ORDER — BECLOMETHASONE DIPROP MONOHYD 42 MCG/SPRAY NA SUSP: 2.0000 | Freq: Two times a day (BID) | NASAL | Status: DC

## 2012-10-31 ENCOUNTER — Encounter: Payer: Self-pay | Admitting: Family Medicine

## 2012-10-31 ENCOUNTER — Ambulatory Visit (INDEPENDENT_AMBULATORY_CARE_PROVIDER_SITE_OTHER): Payer: 59 | Admitting: Family Medicine

## 2012-10-31 VITALS — BP 130/64 | Temp 98.0°F | Wt 243.0 lb

## 2012-10-31 DIAGNOSIS — I10 Essential (primary) hypertension: Secondary | ICD-10-CM

## 2012-10-31 MED ORDER — HYDROCHLOROTHIAZIDE 25 MG PO TABS: ORAL_TABLET | ORAL | Status: DC

## 2012-10-31 NOTE — Progress Notes (Signed)
  Subjective:    Patient ID: Carla Schmitt, female    DOB: Jan 08, 1946, 67 y.o.   MRN: [id number]  HPI Routine medical followup  Hypertension treated with HCTZ amlodipine. Blood pressures been well controlled. Unfortunately, patient has gained significant weight since last visit No consistent exercise. She feels she is retaining some fluid. She requests going back to full HCTZ 25 mg daily. We had been reluctant previously because of history of mild hypokalemia and hyponatremia.  Osteoarthritis with prior total knee replacement. Her recovery has gone well. She is considering getting additional knee done  Past Medical History  Diagnosis Date  . Anxiety   . Bronchitis   . UTI (urinary tract infection)   . Colon polyps   . Hypertension   . Varicose veins     both legs  . Irritable bowel   . Arthritis   . Headache     tension, or allergy headaches  . Chronic kidney disease      kidney stones, passed on own  . Tinnitus     both ears most of the time   Past Surgical History  Procedure Laterality Date  . Foot surgery  many yrs ago    both feet  . Abdominal hysterectomy  02-09-2010    TAH  . Total knee arthroplasty  11/05/2011    Procedure: TOTAL KNEE ARTHROPLASTY;  Surgeon: Loanne Drilling, MD;  Location: [facility];  Service: Orthopedics;  Laterality: Left;    reports that she quit smoking about 21 years ago. Her smoking use included Cigarettes. She has a 18 pack-year smoking history. She has never used smokeless tobacco. She reports that she does not drink alcohol or use illicit drugs. family history includes Arthritis in her other; Heart disease in her other; and Stroke in her other. Allergies  Allergen Reactions  . Cefuroxime Axetil      ceftin=hives      Review of Systems  Constitutional: Negative for fatigue.  Eyes: Negative for visual disturbance.  Respiratory: Negative for cough, chest tightness, shortness of breath and wheezing.   Cardiovascular: Negative for chest  pain, palpitations and leg swelling.  Neurological: Negative for dizziness, seizures, syncope, weakness, light-headedness and headaches.       Objective:   Physical Exam  Constitutional: She appears well-developed and well-nourished.  Neck: Neck supple. No thyromegaly present.  Cardiovascular: Normal rate.   Pulmonary/Chest: Effort normal and breath sounds normal. No respiratory distress. She has no wheezes. She has no rales.  Musculoskeletal:   trace edema lower legs bilaterally  Lymphadenopathy:    She has no cervical adenopathy.          Assessment & Plan:  Hypertension. Well controlled. Titrate HCTZ to 25 mg daily. Recheck basic metabolic panel in 3 months. High potassium diet.

## 2012-10-31 NOTE — Patient Instructions (Addendum)
Go ahead and increase HCTZ to one tablet daily.

## 2012-12-31 ENCOUNTER — Telehealth: Payer: Self-pay | Admitting: *Deleted

## 2012-12-31 NOTE — Telephone Encounter (Signed)
If we can confirm she has been taking, I would refill.

## 2012-12-31 NOTE — Telephone Encounter (Signed)
Received a fax to refill amlodipine 5 mg.  This was no longer on her active med list, so I looked at last OV on 2/28 and noted was titrate HCTZ to 25 mg.  Is she to continue on amlodipine?

## 2013-01-01 MED ORDER — AMLODIPINE BESYLATE 5 MG PO TABS: 5.0000 mg | ORAL_TABLET | Freq: Every morning | ORAL | Status: DC

## 2013-01-01 NOTE — Telephone Encounter (Signed)
I spoke with pt and she reports she still takes Amlodipine 5 mg daily, refill done

## 2013-01-27 ENCOUNTER — Encounter (HOSPITAL_COMMUNITY): Payer: Self-pay | Admitting: Pharmacy Technician

## 2013-01-27 ENCOUNTER — Other Ambulatory Visit: Payer: Self-pay | Admitting: Orthopedic Surgery

## 2013-01-27 MED ORDER — DEXAMETHASONE SODIUM PHOSPHATE 10 MG/ML IJ SOLN: 10.0000 mg | Freq: Once | INTRAMUSCULAR | Status: DC

## 2013-01-27 MED ORDER — BUPIVACAINE LIPOSOME 1.3 % IJ SUSP: 20.0000 mL | Freq: Once | INTRAMUSCULAR | Status: DC

## 2013-01-27 NOTE — Progress Notes (Signed)
Preoperative surgical orders have been place into the [facility] hospital system for Carla Schmitt on 01/27/2013, 7:45 AM  by Patrica Duel for surgery on 02/09/2013.  Preop Total Knee orders including Experal, IV Tylenol, and IV Decadron as long as there are no contraindications to the above medications. [name], PA-C

## 2013-01-28 ENCOUNTER — Other Ambulatory Visit (INDEPENDENT_AMBULATORY_CARE_PROVIDER_SITE_OTHER): Payer: 59

## 2013-01-28 DIAGNOSIS — I10 Essential (primary) hypertension: Secondary | ICD-10-CM

## 2013-01-28 LAB — BASIC METABOLIC PANEL
CO2: 27 mEq/L (ref 19–32)
Glucose, Bld: 93 mg/dL (ref 70–99)
Potassium: 4 mEq/L (ref 3.5–5.1)
Sodium: 139 mEq/L (ref 135–145)

## 2013-01-30 ENCOUNTER — Encounter: Payer: Self-pay | Admitting: Family Medicine

## 2013-01-30 ENCOUNTER — Ambulatory Visit (INDEPENDENT_AMBULATORY_CARE_PROVIDER_SITE_OTHER): Payer: 59 | Admitting: Family Medicine

## 2013-01-30 VITALS — BP 160/80 | Temp 98.7°F | Wt 243.0 lb

## 2013-01-30 DIAGNOSIS — M546 Pain in thoracic spine: Secondary | ICD-10-CM

## 2013-01-30 DIAGNOSIS — I10 Essential (primary) hypertension: Secondary | ICD-10-CM

## 2013-01-30 DIAGNOSIS — M549 Dorsalgia, unspecified: Secondary | ICD-10-CM

## 2013-01-30 MED ORDER — LOSARTAN POTASSIUM-HCTZ 100-25 MG PO TABS: 1.0000 | ORAL_TABLET | Freq: Every day | ORAL | Status: DC

## 2013-01-30 MED ORDER — METHOCARBAMOL 500 MG PO TABS: 500.0000 mg | ORAL_TABLET | Freq: Three times a day (TID) | ORAL | Status: DC | PRN

## 2013-01-30 NOTE — Progress Notes (Signed)
  Subjective:    Patient ID: Carla Schmitt, female    DOB: 27-Nov-1945, 67 y.o.   MRN: [id number]  HPI Patient seen for followup hypertension Currently takes amlodipine 5 mg daily and HCTZ 25 mg daily Blood pressures recently been frequently elevated over 140 sometimes up to 160s systolic. No headaches. No dizziness. No chest pains. Does have some mild edema legs and feet occasionally. Patient preparing to have total knee replacement early June. Had other knee replaced last year and did well.  Has no history of cardiac disease or peripheral vascular disease. Nonsmoker. She will have preop labs as well as EKG and chest x-ray prior surgery.  Recent problems with left upper back pain. One month duration. Left upper trapezius pain.  No cervical radiculopathy symptoms No upper extremity numbness or weakness  Past Medical History  Diagnosis Date  . Anxiety   . Bronchitis   . UTI (urinary tract infection)   . Colon polyps   . Hypertension   . Varicose veins     both legs  . Irritable bowel   . Arthritis   . Headache(784.0)     tension, or allergy headaches  . Chronic kidney disease      kidney stones, passed on own  . Tinnitus     both ears most of the time   Past Surgical History  Procedure Laterality Date  . Foot surgery  many yrs ago    both feet  . Abdominal hysterectomy  02-09-2010    TAH  . Total knee arthroplasty  11/05/2011    Procedure: TOTAL KNEE ARTHROPLASTY;  Surgeon: Loanne Drilling, MD;  Location: [facility];  Service: Orthopedics;  Laterality: Left;    reports that she quit smoking about 21 years ago. Her smoking use included Cigarettes. She has a 18 pack-year smoking history. She has never used smokeless tobacco. She reports that she does not drink alcohol or use illicit drugs. family history includes Arthritis in her other; Heart disease in her other; and Stroke in her other. Allergies  Allergen Reactions  . Cefuroxime Axetil      ceftin=hives        Review  of Systems  Constitutional: Negative for appetite change, fatigue and unexpected weight change.  Eyes: Negative for visual disturbance.  Respiratory: Negative for cough, chest tightness, shortness of breath and wheezing.   Cardiovascular: Negative for chest pain and palpitations.  Endocrine: Negative for polydipsia and polyuria.  Neurological: Negative for dizziness, seizures, syncope, weakness, light-headedness and headaches.       Objective:   Physical Exam  Constitutional: She appears well-developed and well-nourished. No distress.  Neck: Neck supple. No thyromegaly present.  Cardiovascular: Normal rate and regular rhythm.   Pulmonary/Chest: Effort normal and breath sounds normal. No respiratory distress. She has no wheezes. She has no rales.  Musculoskeletal: She exhibits no edema.  Palpable muscle tension left trapezius. Full range of motion cervical spine  Neurological:  No upper extremity weakness.          Assessment & Plan:  #1. Hypertension. Suboptimal control. Stop HCTZ. Start losartan HCTZ 100/25 mg 1 daily. Continue amlodipine 5 mg daily. #2 left upper back pain. Suspect muscular. Robaxin 500 mg each bedtime. Try moist heat. Recommended some stretches.

## 2013-01-30 NOTE — Patient Instructions (Addendum)
Try moist heat to upper back pain.

## 2013-02-04 ENCOUNTER — Encounter (HOSPITAL_COMMUNITY)
Admission: RE | Admit: 2013-02-04 | Discharge: 2013-02-04 | Disposition: A | Discharge status: Home / Self Care | Payer: 59 | Source: Ambulatory Visit | Attending: Orthopedic Surgery | Admitting: Orthopedic Surgery

## 2013-02-04 ENCOUNTER — Encounter (HOSPITAL_COMMUNITY): Payer: Self-pay

## 2013-02-04 ENCOUNTER — Ambulatory Visit (HOSPITAL_COMMUNITY)
Admission: RE | Admit: 2013-02-04 | Discharge: 2013-02-04 | Disposition: A | Discharge status: Home / Self Care | Payer: 59 | Source: Ambulatory Visit | Attending: Orthopedic Surgery | Admitting: Orthopedic Surgery

## 2013-02-04 DIAGNOSIS — Z01812 Encounter for preprocedural laboratory examination: Secondary | ICD-10-CM | POA: Insufficient documentation

## 2013-02-04 DIAGNOSIS — Z01818 Encounter for other preprocedural examination: Secondary | ICD-10-CM | POA: Insufficient documentation

## 2013-02-04 DIAGNOSIS — Z01810 Encounter for preprocedural cardiovascular examination: Secondary | ICD-10-CM | POA: Insufficient documentation

## 2013-02-04 DIAGNOSIS — I10 Essential (primary) hypertension: Secondary | ICD-10-CM | POA: Insufficient documentation

## 2013-02-04 HISTORY — DX: Personal history of urinary calculi: Z87.442

## 2013-02-04 HISTORY — DX: Stiffness of unspecified shoulder, not elsewhere classified: M25.619

## 2013-02-04 LAB — URINE MICROSCOPIC-ADD ON

## 2013-02-04 LAB — URINALYSIS, ROUTINE W REFLEX MICROSCOPIC
Bilirubin Urine: NEGATIVE
Hgb urine dipstick: NEGATIVE
Ketones, ur: NEGATIVE mg/dL
Nitrite: NEGATIVE
pH: 5 (ref 5.0–8.0)

## 2013-02-04 LAB — PROTIME-INR
INR: 1.03 (ref 0.00–1.49)
Prothrombin Time: 13.4 seconds (ref 11.6–15.2)

## 2013-02-04 LAB — CBC
HCT: 38.1 % (ref 36.0–46.0)
MCHC: 33.1 g/dL (ref 30.0–36.0)
MCV: 82.3 fL (ref 78.0–100.0)
RDW: 15 % (ref 11.5–15.5)

## 2013-02-04 LAB — COMPREHENSIVE METABOLIC PANEL
Albumin: 4.1 g/dL (ref 3.5–5.2)
BUN: 21 mg/dL (ref 6–23)
Chloride: 101 mEq/L (ref 96–112)
Creatinine, Ser: 0.75 mg/dL (ref 0.50–1.10)
Total Bilirubin: 0.4 mg/dL (ref 0.3–1.2)
Total Protein: 8.1 g/dL (ref 6.0–8.3)

## 2013-02-04 NOTE — Patient Instructions (Addendum)
Tayleigh P Varricchio  02/04/2013                           YOUR PROCEDURE IS SCHEDULED ON: 02/09/13               PLEASE REPORT TO SHORT STAY CENTER AT : 9:45 AM               CALL THIS NUMBER IF ANY PROBLEMS THE DAY OF SURGERY :               [phone]                      REMEMBER:   Do not eat food or drink liquids AFTER MIDNIGHT  May have clear liquids UNTIL 6 HOURS BEFORE SURGERY  (6:40 AM)  Clear liquids include soda, tea, black coffee, apple or grape juice, broth.  Take these medicines the morning of surgery with A SIP OF WATER: METHOCARBAMOL IF NEEDED / BECONASE NASAL SPRAY   Do not wear jewelry, make-up   Do not wear lotions, powders, or perfumes.   Do not shave legs or underarms 12 hrs. before surgery (men may shave face)  Do not bring valuables to the hospital.  Contacts, dentures or bridgework may not be worn into surgery.  Leave suitcase in the car. After surgery it may be brought to your room.  For patients admitted to the hospital more than one night, checkout time is 11:00                          The day of discharge.   Patients discharged the day of surgery will not be allowed to drive home                             If going home same day of surgery, must have someone stay with you first                           24 hrs at home and arrange for some one to drive you home from hospital.    Special Instructions:   Please read over the following fact sheets that you were given:               1. MRSA  INFORMATION                      2. [place] PREPARING FOR SURGERY SHEET               3. INCENTIVE SPIROMETER                                                X_____________________________________________________________________        Failure to follow these instructions may result in cancellation of your surgery

## 2013-02-04 NOTE — Progress Notes (Signed)
Abnormal UA / CMET faxed to Dr.Aluisio 

## 2013-02-08 ENCOUNTER — Other Ambulatory Visit: Payer: Self-pay | Admitting: Orthopedic Surgery

## 2013-02-08 NOTE — H&P (Signed)
Carla Schmitt  DOB: 01/02/1946 Married / Language: English / Race: Black or African American Female  Date of Admission:  02/09/2013  Chief Complaint:  Right Knee Pain  History of Present Illness The patient is a 67 year old female who comes in for a preoperative History and Physical. The patient is scheduled for a right total knee arthroplasty to be performed by Dr. Frank V. Aluisio, MD at [facility] on 02/09/2013. The patient is a 67 year old female presenting for a post-operative visit. The patient comes in a year out from left total knee arthroplasty. The patient states that she is doing well at this time. The pain is under excellent control at this time and describe their pain as mild. They are currently on no medication for their pain. The patient is currently doing home exercise program. The patient feels that they are progressing well at this time. She said the left knee is doing very well at this time. The right knee is what is getting progressively worse. She has known advanced arthritis in the right knee. She has failed injections in the past. The right knee is equally as bad as the left one was prior to surgery. She did not think she would ever get to the stage where she wanted this fixed but she said she has done so well with the left one now that she is ready to go ahead and get the right side done. They have been treated conservatively in the past for the above stated problem and despite conservative measures, they continue to have progressive pain and severe functional limitations and dysfunction. They have failed non-operative management including home exercise, medications. It is felt that they would benefit from undergoing total joint replacement. Risks and benefits of the procedure have been discussed with the patient and they elect to proceed with surgery. There are no active contraindications to surgery such as ongoing infection or rapidly progressive neurological  disease.   Problem List  Primary osteoarthritis of one knee (715.16)  Allergies  Ceftin *CEPHALOSPORINS*. Rash.   Family History Osteoporosis. grandfather mothers side Father. Deceased, Congestive Heart Failure, Hypertension. age 70's Mother. Deceased. age 82, History of Pneumonia Osteoarthritis. sister, grandmother mothers side and grandfather mothers side Heart Disease. father, brother and grandmother fathers side Congestive Heart Failure. father Hypertension. father and brother   Social History Tobacco use. Former smoker. Marital status. Married. Living situation. Lives with spouse. Alcohol use. Never consumed alcohol. Tobacco use. former smoker; smoke(d) 1 pack(s) per day Marital status. married Living situation. live with spouse Tobacco / smoke exposure. no Number of flights of stairs before winded. less than 1 Illicit drug use. no Post-Surgical Plans. Plan to go home Drug/Alcohol Rehab (Currently). no Current work status. retired Exercise. Exercises weekly; does other Drug/Alcohol Rehab (Previously). no Children. 2 Alcohol use. never consumed alcohol    Past Surgical History  Colon Polyp Removal - Colonoscopy Hysterectomy. Date: 02/2010. partial (non-cancerous) Foot Surgery. bilateral   Medical History Irritable bowel syndrome Hypertension Tinnitus Menopause   Review of Systems  General:Present- Night Sweats. Not Present- Chills, Fever, Fatigue, Weight Gain, Weight Loss and Memory Loss. Skin:Not Present- Hives, Itching, Rash, Eczema and Lesions. HEENT:Present- Tinnitus. Not Present- Headache, Double Vision, Visual Loss, Hearing Loss and Dentures. Respiratory:Not Present- Shortness of breath with exertion, Shortness of breath at rest, Allergies, Coughing up blood and Chronic Cough. Cardiovascular:Not Present- Chest Pain, Racing/skipping heartbeats, Difficulty Breathing Lying Down, Murmur, Swelling and  Palpitations. Gastrointestinal:Present- Diarrhea. Not Present- Bloody Stool,   Heartburn, Abdominal Pain, Vomiting, Nausea, Constipation, Difficulty Swallowing, Jaundice and Loss of appetitie. Female Genitourinary:Present- Urinating at Night. Not Present- Blood in Urine, Urinary frequency, Weak urinary stream, Discharge, Flank Pain, Incontinence, Painful Urination, Urgency and Urinary Retention. Musculoskeletal:Present- Joint Pain and Morning Stiffness. Not Present- Back Pain and Spasms. Neurological:Not Present- Tremor, Dizziness, Blackout spells, Paralysis, Difficulty with balance and Weakness. Psychiatric:Not Present- Insomnia.   Vitals  Weight: 239 lb Height: 64 in Body Surface Area: 2.21 m Body Mass Index: 41.02 kg/m Pulse: 96 (Regular) Resp.: 14 (Unlabored) BP: 152/92 (Sitting, Right Arm, Standard)    Physical Exam  The physical exam findings are as follows:  Note: Patient is a 67 year old female with continued left knee pain.   General Mental Status - Alert, cooperative and good historian. General Appearance- pleasant. Not in acute distress. Orientation- Oriented X3. Build & Nutrition- Overweight, Obese (hip and thigh) and Well developed.   Head and Neck Head- normocephalic, atraumatic . Neck Global Assessment- supple. no bruit auscultated on the right and no bruit auscultated on the left.   Eye Pupil- Bilateral- Regular and Round. Note: reading glasses at times Motion- Bilateral- EOMI.   ENMT  Upper and lower dentures  Chest and Lung Exam Auscultation: Breath sounds:- clear at anterior chest wall and - clear at posterior chest wall. Adventitious sounds:- No Adventitious sounds.   Cardiovascular Auscultation:Rhythm- Regular rate and rhythm. Heart Sounds- S1 WNL and S2 WNL. Murmurs & Other Heart Sounds:Auscultation of the heart reveals - No Murmurs.   Abdomen Inspection:Contour- Generalized moderate  distention. Palpation/Percussion:Tenderness- Abdomen is non-tender to palpation. Rigidity (guarding)- Abdomen is soft. Auscultation:Auscultation of the abdomen reveals - Bowel sounds normal.   Female Genitourinary  Not done, not pertinent to present illness  Musculoskeletal  Well developed female in no distress. Her left knee shows no swelling. Range of motion on her left knee is about 0 to 115 degrees. There is no instability on the left. The right knee significant varus, range 5 to 110 with marked crepitus on range of motion. Tenderness medial greater than lateral. No instability noted.  RADIOGRAPHS: AP and lateral both knees show the prosthesis on the left in excellent position with no periprosthetic abnormalities. On the right she has advanced endstage arthritis with bone on bone medial and patellofemoral and significant tibial subluxation.  Assessment & Plan  Primary osteoarthritis of one knee (715.16) Impression: Right Knee  S/P Left total knee arthroplasty (V43.65)  Note: Plan is for a Right Total Knee Replacement by Dr. Aluisio.  Plan is to go home.  The patient does not have any contraindications and will recieve TXA (tranexamic acid) prior to surgery.  Patient states that she had the spinal anesthetic last time and tolerated it well.  PCP - Dr. Bruce Burchett  Signed electronically by Alexzandrew L Perkins, III PA-C 

## 2013-02-09 ENCOUNTER — Encounter (HOSPITAL_COMMUNITY): Payer: Self-pay | Admitting: Anesthesiology

## 2013-02-09 ENCOUNTER — Encounter (HOSPITAL_COMMUNITY)
Admission: RE | Disposition: A | Discharge status: Home / Self Care | Payer: Self-pay | Source: Ambulatory Visit | Attending: Orthopedic Surgery

## 2013-02-09 ENCOUNTER — Ambulatory Visit (HOSPITAL_COMMUNITY): Payer: 59 | Admitting: Anesthesiology

## 2013-02-09 ENCOUNTER — Encounter (HOSPITAL_COMMUNITY): Payer: Self-pay | Admitting: *Deleted

## 2013-02-09 ENCOUNTER — Inpatient Hospital Stay (HOSPITAL_COMMUNITY)
Admission: RE | Admit: 2013-02-09 | Discharge: 2013-02-11 | DRG: 470 | Disposition: A | Discharge status: Home Health | Payer: 59 | Source: Ambulatory Visit | Attending: Orthopedic Surgery | Admitting: Orthopedic Surgery

## 2013-02-09 DIAGNOSIS — Z96651 Presence of right artificial knee joint: Secondary | ICD-10-CM

## 2013-02-09 DIAGNOSIS — K589 Irritable bowel syndrome without diarrhea: Secondary | ICD-10-CM | POA: Diagnosis present

## 2013-02-09 DIAGNOSIS — M1710 Unilateral primary osteoarthritis, unspecified knee: Principal | ICD-10-CM | POA: Diagnosis present

## 2013-02-09 DIAGNOSIS — Z6841 Body Mass Index (BMI) 40.0 and over, adult: Secondary | ICD-10-CM

## 2013-02-09 DIAGNOSIS — Z780 Asymptomatic menopausal state: Secondary | ICD-10-CM

## 2013-02-09 DIAGNOSIS — D62 Acute posthemorrhagic anemia: Secondary | ICD-10-CM

## 2013-02-09 DIAGNOSIS — H9319 Tinnitus, unspecified ear: Secondary | ICD-10-CM | POA: Diagnosis present

## 2013-02-09 DIAGNOSIS — E669 Obesity, unspecified: Secondary | ICD-10-CM | POA: Diagnosis present

## 2013-02-09 DIAGNOSIS — I10 Essential (primary) hypertension: Secondary | ICD-10-CM | POA: Diagnosis present

## 2013-02-09 DIAGNOSIS — E876 Hypokalemia: Secondary | ICD-10-CM | POA: Diagnosis present

## 2013-02-09 DIAGNOSIS — Z87891 Personal history of nicotine dependence: Secondary | ICD-10-CM

## 2013-02-09 HISTORY — PX: TOTAL KNEE ARTHROPLASTY: SHX125

## 2013-02-09 LAB — TYPE AND SCREEN

## 2013-02-09 SURGERY — ARTHROPLASTY, KNEE, TOTAL
Anesthesia: Spinal | Site: Knee | Laterality: Right | Wound class: Clean

## 2013-02-09 MED ORDER — MORPHINE SULFATE 2 MG/ML IJ SOLN
1.0000 mg | INTRAMUSCULAR | Status: DC | PRN
  Administered 2013-02-09: 1 mg via INTRAVENOUS
  Filled 2013-02-09: qty 1

## 2013-02-09 MED ORDER — DOCUSATE SODIUM 100 MG PO CAPS
100.0000 mg | ORAL_CAPSULE | Freq: Two times a day (BID) | ORAL | Status: DC
  Administered 2013-02-09 – 2013-02-11 (×4): 100 mg via ORAL

## 2013-02-09 MED ORDER — KETOROLAC TROMETHAMINE 15 MG/ML IJ SOLN
15.0000 mg | Freq: Four times a day (QID) | INTRAMUSCULAR | Status: DC | PRN
  Administered 2013-02-09: 15 mg via INTRAVENOUS
  Filled 2013-02-09: qty 1

## 2013-02-09 MED ORDER — BLISTEX EX OINT
TOPICAL_OINTMENT | CUTANEOUS | Status: AC
  Administered 2013-02-09: 21:00:00
  Filled 2013-02-09: qty 10

## 2013-02-09 MED ORDER — PROPOFOL INFUSION 10 MG/ML OPTIME
INTRAVENOUS | Status: DC | PRN
  Administered 2013-02-09: 100 ug/kg/min via INTRAVENOUS

## 2013-02-09 MED ORDER — BUPIVACAINE HCL (PF) 0.25 % IJ SOLN
INTRAMUSCULAR | Status: AC
  Filled 2013-02-09: qty 30

## 2013-02-09 MED ORDER — VANCOMYCIN HCL IN DEXTROSE 1-5 GM/200ML-% IV SOLN
1000.0000 mg | Freq: Two times a day (BID) | INTRAVENOUS | Status: AC
  Administered 2013-02-10: 1000 mg via INTRAVENOUS
  Filled 2013-02-09: qty 200

## 2013-02-09 MED ORDER — ACETAMINOPHEN 10 MG/ML IV SOLN: 1000.0000 mg | Freq: Once | INTRAVENOUS | Status: DC

## 2013-02-09 MED ORDER — FLEET ENEMA 7-19 GM/118ML RE ENEM: 1.0000 | ENEMA | Freq: Once | RECTAL | Status: AC | PRN

## 2013-02-09 MED ORDER — ACETAMINOPHEN 325 MG PO TABS: 650.0000 mg | ORAL_TABLET | Freq: Four times a day (QID) | ORAL | Status: DC | PRN

## 2013-02-09 MED ORDER — POTASSIUM CHLORIDE IN NACL 20-0.9 MEQ/L-% IV SOLN
INTRAVENOUS | Status: DC
  Administered 2013-02-09: 75 mL/h via INTRAVENOUS
  Administered 2013-02-10: 02:00:00 via INTRAVENOUS
  Filled 2013-02-09 (×2): qty 1000

## 2013-02-09 MED ORDER — PROPOFOL 10 MG/ML IV BOLUS
INTRAVENOUS | Status: DC | PRN
  Administered 2013-02-09: 30 mg via INTRAVENOUS

## 2013-02-09 MED ORDER — FENTANYL CITRATE 0.05 MG/ML IJ SOLN: 25.0000 ug | INTRAMUSCULAR | Status: DC | PRN

## 2013-02-09 MED ORDER — FENTANYL CITRATE 0.05 MG/ML IJ SOLN
INTRAMUSCULAR | Status: DC | PRN
  Administered 2013-02-09 (×2): 50 ug via INTRAVENOUS

## 2013-02-09 MED ORDER — ACETAMINOPHEN 650 MG RE SUPP: 650.0000 mg | Freq: Four times a day (QID) | RECTAL | Status: DC | PRN

## 2013-02-09 MED ORDER — SODIUM CHLORIDE 0.9 % IJ SOLN
INTRAMUSCULAR | Status: DC | PRN
  Administered 2013-02-09: 30 mL via INTRAVENOUS

## 2013-02-09 MED ORDER — PROMETHAZINE HCL 25 MG/ML IJ SOLN: 6.2500 mg | INTRAMUSCULAR | Status: DC | PRN

## 2013-02-09 MED ORDER — LOSARTAN POTASSIUM 50 MG PO TABS
100.0000 mg | ORAL_TABLET | Freq: Every day | ORAL | Status: DC
  Administered 2013-02-09 – 2013-02-11 (×3): 100 mg via ORAL
  Filled 2013-02-09 (×3): qty 2

## 2013-02-09 MED ORDER — CHLORHEXIDINE GLUCONATE 4 % EX LIQD: 60.0000 mL | Freq: Once | CUTANEOUS | Status: DC

## 2013-02-09 MED ORDER — METHOCARBAMOL 100 MG/ML IJ SOLN
500.0000 mg | Freq: Four times a day (QID) | INTRAVENOUS | Status: DC | PRN
  Administered 2013-02-09: 500 mg via INTRAVENOUS
  Filled 2013-02-09: qty 5

## 2013-02-09 MED ORDER — RIVAROXABAN 10 MG PO TABS
10.0000 mg | ORAL_TABLET | Freq: Every day | ORAL | Status: DC
  Administered 2013-02-10 – 2013-02-11 (×2): 10 mg via ORAL
  Filled 2013-02-09 (×3): qty 1

## 2013-02-09 MED ORDER — LOSARTAN POTASSIUM-HCTZ 100-25 MG PO TABS: 1.0000 | ORAL_TABLET | Freq: Every day | ORAL | Status: DC

## 2013-02-09 MED ORDER — METOCLOPRAMIDE HCL 5 MG PO TABS
5.0000 mg | ORAL_TABLET | Freq: Three times a day (TID) | ORAL | Status: DC | PRN
  Filled 2013-02-09: qty 2

## 2013-02-09 MED ORDER — DEXAMETHASONE 6 MG PO TABS
10.0000 mg | ORAL_TABLET | Freq: Every day | ORAL | Status: AC
  Administered 2013-02-10: 10 mg via ORAL
  Filled 2013-02-09: qty 1

## 2013-02-09 MED ORDER — PHENYLEPHRINE HCL 10 MG/ML IJ SOLN
INTRAMUSCULAR | Status: DC | PRN
  Administered 2013-02-09: 60 ug via INTRAVENOUS

## 2013-02-09 MED ORDER — VANCOMYCIN HCL 10 G IV SOLR
1500.0000 mg | INTRAVENOUS | Status: AC
  Administered 2013-02-09: 1500 mg via INTRAVENOUS
  Filled 2013-02-09: qty 1500

## 2013-02-09 MED ORDER — BUPIVACAINE IN DEXTROSE 0.75-8.25 % IT SOLN
INTRATHECAL | Status: DC | PRN
  Administered 2013-02-09: 1.6 mL via INTRATHECAL

## 2013-02-09 MED ORDER — BUPIVACAINE LIPOSOME 1.3 % IJ SUSP
20.0000 mL | Freq: Once | INTRAMUSCULAR | Status: DC
  Filled 2013-02-09: qty 20

## 2013-02-09 MED ORDER — FLUTICASONE PROPIONATE 50 MCG/ACT NA SUSP
2.0000 | Freq: Every day | NASAL | Status: DC
  Filled 2013-02-09: qty 16

## 2013-02-09 MED ORDER — TRAMADOL HCL 50 MG PO TABS
50.0000 mg | ORAL_TABLET | Freq: Four times a day (QID) | ORAL | Status: DC | PRN
  Administered 2013-02-10: 100 mg via ORAL
  Filled 2013-02-09: qty 2

## 2013-02-09 MED ORDER — MENTHOL 3 MG MT LOZG: 1.0000 | LOZENGE | OROMUCOSAL | Status: DC | PRN

## 2013-02-09 MED ORDER — PHENOL 1.4 % MT LIQD: 1.0000 | OROMUCOSAL | Status: DC | PRN

## 2013-02-09 MED ORDER — MEPERIDINE HCL 50 MG/ML IJ SOLN: 6.2500 mg | INTRAMUSCULAR | Status: DC | PRN

## 2013-02-09 MED ORDER — OXYCODONE HCL 5 MG PO TABS
5.0000 mg | ORAL_TABLET | ORAL | Status: DC | PRN
  Administered 2013-02-09 – 2013-02-11 (×10): 10 mg via ORAL
  Filled 2013-02-09 (×10): qty 2

## 2013-02-09 MED ORDER — LACTATED RINGERS IV SOLN
INTRAVENOUS | Status: DC
  Administered 2013-02-09: 13:00:00 via INTRAVENOUS
  Administered 2013-02-09: 1000 mL via INTRAVENOUS
  Administered 2013-02-09: 14:00:00 via INTRAVENOUS

## 2013-02-09 MED ORDER — METHOCARBAMOL 500 MG PO TABS
500.0000 mg | ORAL_TABLET | Freq: Four times a day (QID) | ORAL | Status: DC | PRN
  Administered 2013-02-09 – 2013-02-11 (×5): 500 mg via ORAL
  Filled 2013-02-09 (×5): qty 1

## 2013-02-09 MED ORDER — ONDANSETRON HCL 4 MG PO TABS: 4.0000 mg | ORAL_TABLET | Freq: Four times a day (QID) | ORAL | Status: DC | PRN

## 2013-02-09 MED ORDER — LACTATED RINGERS IV SOLN: INTRAVENOUS | Status: DC

## 2013-02-09 MED ORDER — METOCLOPRAMIDE HCL 5 MG/ML IJ SOLN
5.0000 mg | Freq: Three times a day (TID) | INTRAMUSCULAR | Status: DC | PRN
  Administered 2013-02-10: 10 mg via INTRAVENOUS
  Filled 2013-02-09: qty 2

## 2013-02-09 MED ORDER — HYDROCHLOROTHIAZIDE 25 MG PO TABS
25.0000 mg | ORAL_TABLET | Freq: Every day | ORAL | Status: DC
  Administered 2013-02-09 – 2013-02-11 (×3): 25 mg via ORAL
  Filled 2013-02-09 (×3): qty 1

## 2013-02-09 MED ORDER — TRANEXAMIC ACID 100 MG/ML IV SOLN
1000.0000 mg | INTRAVENOUS | Status: AC
  Administered 2013-02-09: 1000 mg via INTRAVENOUS
  Filled 2013-02-09: qty 10

## 2013-02-09 MED ORDER — AMLODIPINE BESYLATE 5 MG PO TABS
5.0000 mg | ORAL_TABLET | Freq: Every day | ORAL | Status: DC
  Administered 2013-02-10: 5 mg via ORAL
  Filled 2013-02-09 (×3): qty 1

## 2013-02-09 MED ORDER — POLYETHYLENE GLYCOL 3350 17 G PO PACK: 17.0000 g | PACK | Freq: Every day | ORAL | Status: DC | PRN

## 2013-02-09 MED ORDER — SODIUM CHLORIDE 0.9 % IV SOLN: INTRAVENOUS | Status: DC

## 2013-02-09 MED ORDER — HYDROMORPHONE HCL PF 1 MG/ML IJ SOLN
0.5000 mg | INTRAMUSCULAR | Status: DC | PRN
  Administered 2013-02-09 – 2013-02-10 (×5): 1 mg via INTRAVENOUS
  Filled 2013-02-09 (×5): qty 1

## 2013-02-09 MED ORDER — ONDANSETRON HCL 4 MG/2ML IJ SOLN: 4.0000 mg | Freq: Four times a day (QID) | INTRAMUSCULAR | Status: DC | PRN

## 2013-02-09 MED ORDER — DEXAMETHASONE SODIUM PHOSPHATE 10 MG/ML IJ SOLN
10.0000 mg | Freq: Every day | INTRAMUSCULAR | Status: AC
  Filled 2013-02-09: qty 1

## 2013-02-09 MED ORDER — BISACODYL 10 MG RE SUPP: 10.0000 mg | Freq: Every day | RECTAL | Status: DC | PRN

## 2013-02-09 MED ORDER — BUPIVACAINE HCL 0.25 % IJ SOLN
INTRAMUSCULAR | Status: DC | PRN
  Administered 2013-02-09: 20 mL

## 2013-02-09 MED ORDER — MORPHINE SULFATE 2 MG/ML IJ SOLN
INTRAMUSCULAR | Status: AC
  Administered 2013-02-09: 1 mg via INTRAVENOUS
  Filled 2013-02-09: qty 1

## 2013-02-09 MED ORDER — DIPHENHYDRAMINE HCL 12.5 MG/5ML PO ELIX: 12.5000 mg | ORAL_SOLUTION | ORAL | Status: DC | PRN

## 2013-02-09 MED ORDER — MIDAZOLAM HCL 5 MG/5ML IJ SOLN
INTRAMUSCULAR | Status: DC | PRN
  Administered 2013-02-09: 2 mg via INTRAVENOUS

## 2013-02-09 MED ORDER — BUPIVACAINE LIPOSOME 1.3 % IJ SUSP
INTRAMUSCULAR | Status: DC | PRN
  Administered 2013-02-09: 20 mL

## 2013-02-09 SURGICAL SUPPLY — 61 items
BAG SPEC THK2 15X12 ZIP CLS (MISCELLANEOUS) ×1
BAG ZIPLOCK 12X15 (MISCELLANEOUS) ×2 IMPLANT
BANDAGE ELASTIC 6 VELCRO ST LF (GAUZE/BANDAGES/DRESSINGS) ×2 IMPLANT
BANDAGE ESMARK 6X9 LF (GAUZE/BANDAGES/DRESSINGS) ×1 IMPLANT
BLADE SAG 18X100X1.27 (BLADE) ×2 IMPLANT
BLADE SAW SGTL 11.0X1.19X90.0M (BLADE) ×2 IMPLANT
BNDG CMPR 9X6 STRL LF SNTH (GAUZE/BANDAGES/DRESSINGS) ×1
BNDG ESMARK 6X9 LF (GAUZE/BANDAGES/DRESSINGS) ×2
BOWL SMART MIX CTS (DISPOSABLE) ×2 IMPLANT
CAPT RP KNEE ×1 IMPLANT
CEMENT HV SMART SET (Cement) ×4 IMPLANT
CLOSURE STERI-STRIP 1/4X4 (GAUZE/BANDAGES/DRESSINGS) ×1 IMPLANT
CLOTH BEACON ORANGE TIMEOUT ST (SAFETY) ×2 IMPLANT
CUFF TOURN SGL QUICK 34 (TOURNIQUET CUFF) ×2
CUFF TRNQT CYL 34X4X40X1 (TOURNIQUET CUFF) ×1 IMPLANT
DECANTER SPIKE VIAL GLASS SM (MISCELLANEOUS) ×2 IMPLANT
DRAPE EXTREMITY T 121X128X90 (DRAPE) ×2 IMPLANT
DRAPE POUCH INSTRU U-SHP 10X18 (DRAPES) ×2 IMPLANT
DRAPE U-SHAPE 47X51 STRL (DRAPES) ×2 IMPLANT
DRSG ADAPTIC 3X8 NADH LF (GAUZE/BANDAGES/DRESSINGS) ×2 IMPLANT
DRSG EMULSION OIL 3X16 NADH (GAUZE/BANDAGES/DRESSINGS) ×1 IMPLANT
DRSG MEPILEX BORDER 4X4 (GAUZE/BANDAGES/DRESSINGS) ×1 IMPLANT
DRSG MEPILEX BORDER 4X8 (GAUZE/BANDAGES/DRESSINGS) ×1 IMPLANT
DRSG PAD ABDOMINAL 8X10 ST (GAUZE/BANDAGES/DRESSINGS) ×2 IMPLANT
DURAPREP 26ML APPLICATOR (WOUND CARE) ×2 IMPLANT
ELECT REM PT RETURN 9FT ADLT (ELECTROSURGICAL) ×2
ELECTRODE REM PT RTRN 9FT ADLT (ELECTROSURGICAL) ×1 IMPLANT
EVACUATOR 1/8 PVC DRAIN (DRAIN) ×2 IMPLANT
FACESHIELD LNG OPTICON STERILE (SAFETY) ×10 IMPLANT
GLOVE BIO SURGEON STRL SZ7.5 (GLOVE) IMPLANT
GLOVE BIO SURGEON STRL SZ8 (GLOVE) ×2 IMPLANT
GLOVE BIOGEL PI IND STRL 8 (GLOVE) ×1 IMPLANT
GLOVE BIOGEL PI INDICATOR 8 (GLOVE) ×1
GLOVE SURG SS PI 6.5 STRL IVOR (GLOVE) IMPLANT
GOWN STRL NON-REIN LRG LVL3 (GOWN DISPOSABLE) ×2 IMPLANT
GOWN STRL REIN XL XLG (GOWN DISPOSABLE) IMPLANT
HANDPIECE INTERPULSE COAX TIP (DISPOSABLE) ×2
IMMOBILIZER KNEE 20 (SOFTGOODS) ×2
IMMOBILIZER KNEE 20 THIGH 36 (SOFTGOODS) ×1 IMPLANT
KIT BASIN OR (CUSTOM PROCEDURE TRAY) ×2 IMPLANT
MANIFOLD NEPTUNE II (INSTRUMENTS) ×2 IMPLANT
NDL SAFETY ECLIPSE 18X1.5 (NEEDLE) ×2 IMPLANT
NEEDLE HYPO 18GX1.5 SHARP (NEEDLE) ×4
NS IRRIG 1000ML POUR BTL (IV SOLUTION) ×2 IMPLANT
PACK TOTAL JOINT (CUSTOM PROCEDURE TRAY) ×2 IMPLANT
PADDING CAST COTTON 6X4 STRL (CAST SUPPLIES) ×4 IMPLANT
POSITIONER SURGICAL ARM (MISCELLANEOUS) ×2 IMPLANT
SET HNDPC FAN SPRY TIP SCT (DISPOSABLE) ×1 IMPLANT
SPONGE GAUZE 4X4 12PLY (GAUZE/BANDAGES/DRESSINGS) ×2 IMPLANT
STRIP CLOSURE SKIN 1/2X4 (GAUZE/BANDAGES/DRESSINGS) ×4 IMPLANT
SUCTION FRAZIER 12FR DISP (SUCTIONS) ×2 IMPLANT
SUT MNCRL AB 4-0 PS2 18 (SUTURE) ×2 IMPLANT
SUT VIC AB 2-0 CT1 27 (SUTURE) ×6
SUT VIC AB 2-0 CT1 TAPERPNT 27 (SUTURE) ×3 IMPLANT
SUT VLOC 180 0 24IN GS25 (SUTURE) ×2 IMPLANT
SYR 20CC LL (SYRINGE) ×2 IMPLANT
SYR 50ML LL SCALE MARK (SYRINGE) ×2 IMPLANT
TOWEL OR 17X26 10 PK STRL BLUE (TOWEL DISPOSABLE) ×4 IMPLANT
TRAY FOLEY CATH 14FRSI W/METER (CATHETERS) ×2 IMPLANT
WATER STERILE IRR 1500ML POUR (IV SOLUTION) ×2 IMPLANT
WRAP KNEE MAXI GEL POST OP (GAUZE/BANDAGES/DRESSINGS) ×2 IMPLANT

## 2013-02-09 NOTE — Interval H&P Note (Signed)
History and Physical Interval Note:  02/09/2013 9:28 AM  Carla Schmitt  has presented today for surgery, with the diagnosis of Osteoarthritis of the right knee  The various methods of treatment have been discussed with the patient and family. After consideration of risks, benefits and other options for treatment, the patient has consented to  Procedure(s): RIGHT TOTAL KNEE ARTHROPLASTY (Right) as a surgical intervention .  The patient's history has been reviewed, patient examined, no change in status, stable for surgery.  I have reviewed the patient's chart and labs.  Questions were answered to the patient's satisfaction.     Loanne Drilling

## 2013-02-09 NOTE — Anesthesia Procedure Notes (Signed)
Spinal  Patient location during procedure: OR Staffing Anesthesiologist: Amariyon Maynes Performed by: anesthesiologist  Preanesthetic Checklist Completed: patient identified, site marked, surgical consent, pre-op evaluation, timeout performed, IV checked, risks and benefits discussed and monitors and equipment checked Spinal Block Patient position: sitting Prep: Betadine Patient monitoring: heart rate, continuous pulse ox and blood pressure Approach: right paramedian Location: L3-4 Injection technique: single-shot Needle Needle type: Sprotte  Needle gauge: 24 G Needle length: 9 cm Additional Notes Expiration date of kit checked and confirmed. Patient tolerated procedure well, without complications.     

## 2013-02-09 NOTE — Anesthesia Preprocedure Evaluation (Signed)
Anesthesia Evaluation  Patient identified by MRN, date of birth, ID band Patient awake    Reviewed: Allergy & Precautions, H&P , NPO status , Patient's Chart, lab work & pertinent test results  Airway Mallampati: II TM Distance: >3 FB Neck ROM: Full    Dental no notable dental hx.    Pulmonary neg pulmonary ROS,  breath sounds clear to auscultation  Pulmonary exam normal       Cardiovascular hypertension, negative cardio ROS  Rhythm:Regular Rate:Normal     Neuro/Psych negative neurological ROS  negative psych ROS   GI/Hepatic negative GI ROS, Neg liver ROS,   Endo/Other  negative endocrine ROS  Renal/GU negative Renal ROS  negative genitourinary   Musculoskeletal negative musculoskeletal ROS (+)   Abdominal   Peds negative pediatric ROS (+)  Hematology negative hematology ROS (+)   Anesthesia Other Findings   Reproductive/Obstetrics negative OB ROS                           Anesthesia Physical  Anesthesia Plan  ASA: II  Anesthesia Plan: Spinal   Post-op Pain Management:    Induction:   Airway Management Planned:   Additional Equipment:   Intra-op Plan:   Post-operative Plan:   Informed Consent: I have reviewed the patients History and Physical, chart, labs and discussed the procedure including the risks, benefits and alternatives for the proposed anesthesia with the patient or authorized representative who has indicated his/her understanding and acceptance.   Dental advisory given  Plan Discussed with: CRNA  Anesthesia Plan Comments:         Anesthesia Quick Evaluation

## 2013-02-09 NOTE — Op Note (Signed)
Pre-operative diagnosis- Osteoarthritis  Right knee(s)  Post-operative diagnosis- Osteoarthritis Right knee(s)  Procedure-  Right  Total Knee Arthroplasty  Surgeon- Gus Talarico. Kamonte Mcmichen, MD  Assistant- [name], PA-C   Anesthesia-  Spinal EBL-* No blood loss amount entered *  Drains Hemovac  Tourniquet time-  Total Tourniquet Time Documented: Thigh (Right) - 36 minutes Total: Thigh (Right) - 36 minutes    Complications- None  Condition-PACU - hemodynamically stable.   Brief Clinical Note  Carla Schmitt is a 67 y.o. year old female with end stage OA of her right knee with progressively worsening pain and dysfunction. She has constant pain, with activity and at rest and significant functional deficits with difficulties even with ADLs. She has had extensive non-op management including analgesics, injections of cortisone and viscosupplements, and home exercise program, but remains in significant pain with significant dysfunction.Radiographs show bone on bone arthritis all 3 compartments. She presents now for right Total Knee Arthroplasty.     Procedure in detail---   The patient is brought into the operating room and positioned supine on the operating table. After successful administration of  Spinal,   a tourniquet is placed high on the  Right thigh(s) and the lower extremity is prepped and draped in the usual sterile fashion. Time out is performed by the operating team and then the  Right lower extremity is wrapped in Esmarch, knee flexed and the tourniquet inflated to 300 mmHg.       A midline incision is made with a ten blade through the subcutaneous tissue to the level of the extensor mechanism. A fresh blade is used to make a medial parapatellar arthrotomy. Soft tissue over the proximal medial tibia is subperiosteally elevated to the joint line with a knife and into the semimembranosus bursa with a Cobb elevator. Soft tissue over the proximal lateral tibia is elevated with attention  being paid to avoiding the patellar tendon on the tibial tubercle. The patella is everted, knee flexed 90 degrees and the ACL and PCL are removed. Findings are bone on bone all 3 compartments with massive global osteophytes.        The drill is used to create a starting hole in the distal femur and the canal is thoroughly irrigated with sterile saline to remove the fatty contents. The 5 degree Right  valgus alignment guide is placed into the femoral canal and the distal femoral cutting block is pinned to remove 10 mm off the distal femur. Resection is made with an oscillating saw.      The tibia is subluxed forward and the menisci are removed. The extramedullary alignment guide is placed referencing proximally at the medial aspect of the tibial tubercle and distally along the second metatarsal axis and tibial crest. The block is pinned to remove 2mm off the more deficient medial  side. Resection is made with an oscillating saw. Size 3is the most appropriate size for the tibia and the proximal tibia is prepared with the modular drill and keel punch for that size.      The femoral sizing guide is placed and size 4 narrow is most appropriate. Rotation is marked off the epicondylar axis and confirmed by creating a rectangular flexion gap at 90 degrees. The size 4 cutting block is pinned in this rotation and the anterior, posterior and chamfer cuts are made with the oscillating saw. The intercondylar block is then placed and that cut is made.      Trial size 3 tibial component, trial size  4 narrow posterior stabilized femur and a 15  mm posterior stabilized rotating platform insert trial is placed. Full extension is achieved with excellent varus/valgus and anterior/posterior balance throughout full range of motion. The patella is everted and thickness measured to be 22  mm. Free hand resection is taken to 12 mm, a 35 template is placed, lug holes are drilled, trial patella is placed, and it tracks normally.  Osteophytes are removed off the posterior femur with the trial in place. All trials are removed and the cut bone surfaces prepared with pulsatile lavage. Cement is mixed and once ready for implantation, the size 3 tibial implant, size  4 narrow posterior stabilized femoral component, and the size 35 patella are cemented in place and the patella is held with the clamp. The trial insert is placed and the knee held in full extension. The Exparel (20 ml mixed with 30 ml saline) and .25% Bupivicaine, are injected into the extensor mechanism, posterior capsule, medial and lateral gutters and subcutaneous tissues.  All extruded cement is removed and once the cement is hard the permanent 15 mm posterior stabilized rotating platform insert is placed into the tibial tray.      The wound is copiously irrigated with saline solution and the extensor mechanism closed over a hemovac drain with #1 PDS suture. The tourniquet is released for a total tourniquet time of 36  minutes. Flexion against gravity is 130 degrees and the patella tracks normally. Subcutaneous tissue is closed with 2.0 vicryl and subcuticular with running 4.0 Monocryl. The incision is cleaned and dried and steri-strips and a bulky sterile dressing are applied. The limb is placed into a knee immobilizer and the patient is awakened and transported to recovery in stable condition.      Please note that a surgical assistant was a medical necessity for this procedure in order to perform it in a safe and expeditious manner. Surgical assistant was necessary to retract the ligaments and vital neurovascular structures to prevent injury to them and also necessary for proper positioning of the limb to allow for anatomic placement of the prosthesis.   Gus Critzer Royetta Probus, MD    02/09/2013, 1:57 PM

## 2013-02-09 NOTE — Anesthesia Postprocedure Evaluation (Signed)
  Anesthesia Post-op Note  Patient: Carla Schmitt  Procedure(s) Performed: Procedure(s) (LRB): RIGHT TOTAL KNEE ARTHROPLASTY (Right)  Patient Location: PACU  Anesthesia Type: Spinal  Level of Consciousness: awake and alert   Airway and Oxygen Therapy: Patient Spontanous Breathing  Post-op Pain: mild  Post-op Assessment: Post-op Vital signs reviewed, Patient's Cardiovascular Status Stable, Respiratory Function Stable, Patent Airway and No signs of Nausea or vomiting  Last Vitals:  Filed Vitals:   02/09/13 1500  BP: 142/73  Pulse: 62  Temp:   Resp: 12    Post-op Vital Signs: stable   Complications: No apparent anesthesia complications

## 2013-02-09 NOTE — Transfer of Care (Signed)
Immediate Anesthesia Transfer of Care Note  Patient: Carla Schmitt  Procedure(s) Performed: Procedure(s): RIGHT TOTAL KNEE ARTHROPLASTY (Right)  Patient Location: PACU  Anesthesia Type:Regional and Spinal  Level of Consciousness: awake, alert , oriented and patient cooperative  Airway & Oxygen Therapy: Patient Spontanous Breathing and Patient connected to face mask oxygen  Post-op Assessment: Report given to PACU RN and Post -op Vital signs reviewed and stable  Post vital signs: Reviewed and stable  Complications: No apparent anesthesia complications

## 2013-02-09 NOTE — H&P (View-Only) (Signed)
Carla Schmitt  DOB: November 17, 1945 Married / Language: English / Race: Black or African American Female  Date of Admission:  02/09/2013  Chief Complaint:  Right Knee Pain  History of Present Illness The patient is a 67 year old female who comes in for a preoperative History and Physical. The patient is scheduled for a right total knee arthroplasty to be performed by Dr. Gus Bankert. Aluisio, MD at [facility] on 02/09/2013. The patient is a 67 year old female presenting for a post-operative visit. The patient comes in a year out from left total knee arthroplasty. The patient states that she is doing well at this time. The pain is under excellent control at this time and describe their pain as mild. They are currently on no medication for their pain. The patient is currently doing home exercise program. The patient feels that they are progressing well at this time. She said the left knee is doing very well at this time. The right knee is what is getting progressively worse. She has known advanced arthritis in the right knee. She has failed injections in the past. The right knee is equally as bad as the left one was prior to surgery. She did not think she would ever get to the stage where she wanted this fixed but she said she has done so well with the left one now that she is ready to go ahead and get the right side done. They have been treated conservatively in the past for the above stated problem and despite conservative measures, they continue to have progressive pain and severe functional limitations and dysfunction. They have failed non-operative management including home exercise, medications. It is felt that they would benefit from undergoing total joint replacement. Risks and benefits of the procedure have been discussed with the patient and they elect to proceed with surgery. There are no active contraindications to surgery such as ongoing infection or rapidly progressive neurological  disease.   Problem List  Primary osteoarthritis of one knee (715.16)  Allergies  Ceftin *CEPHALOSPORINS*. Rash.   Family History Osteoporosis. grandfather mothers side Father. Deceased, Congestive Heart Failure, Hypertension. age 41's Mother. Deceased. age 8, History of Pneumonia Osteoarthritis. sister, grandmother mothers side and grandfather mothers side Heart Disease. father, brother and grandmother fathers side Congestive Heart Failure. father Hypertension. father and brother   Social History Tobacco use. Former smoker. Marital status. Married. Living situation. Lives with spouse. Alcohol use. Never consumed alcohol. Tobacco use. former smoker; smoke(d) 1 pack(s) per day Marital status. married Living situation. live with spouse Tobacco / smoke exposure. no Number of flights of stairs before winded. less than 1 Illicit drug use. no Post-Surgical Plans. Plan to go home Drug/Alcohol Rehab (Currently). no Current work status. retired Exercise. Exercises weekly; does other Drug/Alcohol Rehab (Previously). no Children. 2 Alcohol use. never consumed alcohol    Past Surgical History  Colon Polyp Removal - Colonoscopy Hysterectomy. Date: 02/2010. partial (non-cancerous) Foot Surgery. bilateral   Medical History Irritable bowel syndrome Hypertension Tinnitus Menopause   Review of Systems  General:Present- Night Sweats. Not Present- Chills, Fever, Fatigue, Weight Gain, Weight Loss and Memory Loss. Skin:Not Present- Hives, Itching, Rash, Eczema and Lesions. HEENT:Present- Tinnitus. Not Present- Headache, Double Vision, Visual Loss, Hearing Loss and Dentures. Respiratory:Not Present- Shortness of breath with exertion, Shortness of breath at rest, Allergies, Coughing up blood and Chronic Cough. Cardiovascular:Not Present- Chest Pain, Racing/skipping heartbeats, Difficulty Breathing Lying Down, Murmur, Swelling and  Palpitations. Gastrointestinal:Present- Diarrhea. Not Present- Bloody Stool,  Heartburn, Abdominal Pain, Vomiting, Nausea, Constipation, Difficulty Swallowing, Jaundice and Loss of appetitie. Female Genitourinary:Present- Urinating at Night. Not Present- Blood in Urine, Urinary frequency, Weak urinary stream, Discharge, Flank Pain, Incontinence, Painful Urination, Urgency and Urinary Retention. Musculoskeletal:Present- Joint Pain and Morning Stiffness. Not Present- Back Pain and Spasms. Neurological:Not Present- Tremor, Dizziness, Blackout spells, Paralysis, Difficulty with balance and Weakness. Psychiatric:Not Present- Insomnia.   Vitals  Weight: 239 lb Height: 64 in Body Surface Area: 2.21 m Body Mass Index: 41.02 kg/m Pulse: 96 (Regular) Resp.: 14 (Unlabored) BP: 152/92 (Sitting, Right Arm, Standard)    Physical Exam  The physical exam findings are as follows:  Note: Patient is a 67 year old female with continued left knee pain.   General Mental Status - Alert, cooperative and good historian. General Appearance- pleasant. Not in acute distress. Orientation- Oriented X3. Build & Nutrition- Overweight, Obese (hip and thigh) and Well developed.   Head and Neck Head- normocephalic, atraumatic . Neck Global Assessment- supple. no bruit auscultated on the right and no bruit auscultated on the left.   Eye Pupil- Bilateral- Regular and Round. Note: reading glasses at times Motion- Bilateral- EOMI.   ENMT  Upper and lower dentures  Chest and Lung Exam Auscultation: Breath sounds:- clear at anterior chest wall and - clear at posterior chest wall. Adventitious sounds:- No Adventitious sounds.   Cardiovascular Auscultation:Rhythm- Regular rate and rhythm. Heart Sounds- S1 WNL and S2 WNL. Murmurs & Other Heart Sounds:Auscultation of the heart reveals - No Murmurs.   Abdomen Inspection:Contour- Generalized moderate  distention. Palpation/Percussion:Tenderness- Abdomen is non-tender to palpation. Rigidity (guarding)- Abdomen is soft. Auscultation:Auscultation of the abdomen reveals - Bowel sounds normal.   Female Genitourinary  Not done, not pertinent to present illness  Musculoskeletal  Well developed female in no distress. Her left knee shows no swelling. Range of motion on her left knee is about 0 to 115 degrees. There is no instability on the left. The right knee significant varus, range 5 to 110 with marked crepitus on range of motion. Tenderness medial greater than lateral. No instability noted.  RADIOGRAPHS: AP and lateral both knees show the prosthesis on the left in excellent position with no periprosthetic abnormalities. On the right she has advanced endstage arthritis with bone on bone medial and patellofemoral and significant tibial subluxation.  Assessment & Plan  Primary osteoarthritis of one knee (715.16) Impression: Right Knee  S/P Left total knee arthroplasty (V43.65)  Note: Plan is for a Right Total Knee Replacement by Dr. Lequita Halt.  Plan is to go home.  The patient does not have any contraindications and will recieve TXA (tranexamic acid) prior to surgery.  Patient states that she had the spinal anesthetic last time and tolerated it well.  PCP - Dr. Edwin Cap  Signed electronically by Lauraine Rinne, III PA-C

## 2013-02-10 ENCOUNTER — Encounter (HOSPITAL_COMMUNITY): Payer: Self-pay | Admitting: Orthopedic Surgery

## 2013-02-10 DIAGNOSIS — D62 Acute posthemorrhagic anemia: Secondary | ICD-10-CM

## 2013-02-10 LAB — BASIC METABOLIC PANEL
BUN: 15 mg/dL (ref 6–23)
Chloride: 99 mEq/L (ref 96–112)
Creatinine, Ser: 0.76 mg/dL (ref 0.50–1.10)
GFR calc Af Amer: >90 mL/min (ref >90)

## 2013-02-10 LAB — CBC
HCT: 32.1 % — ABNORMAL LOW (ref 36.0–46.0)
MCHC: 33.3 g/dL (ref 30.0–36.0)
MCV: 82.5 fL (ref 78.0–100.0)
RDW: 15.1 % (ref 11.5–15.5)
WBC: 9 10*3/uL (ref 4.0–10.5)

## 2013-02-10 NOTE — Progress Notes (Signed)
Patient BP 170/76. Patient denies headache or blurred vision. [name] PA notified. No new orders given.

## 2013-02-10 NOTE — Progress Notes (Signed)
   Subjective: 1 Day Post-Op Procedure(s) (LRB): RIGHT TOTAL KNEE ARTHROPLASTY (Right) Patient reports pain as moderate and severe last night.  Doing better today.  Patient seen in rounds with Dr. Lequita Halt. Patient is well, and has had no acute complaints or problems We will start therapy today.  Plan is to go Home after hospital stay.  Objective: Vital signs in last 24 hours: Temp:  [97.5 F (36.4 C)-98.9 F (37.2 C)] 98.4 F (36.9 C) (06/10 0523) Pulse Rate:  [62-103] 92 (06/10 0523) Resp:  [12-18] 16 (06/10 0523) BP: (126-184)/(59-84) 130/76 mmHg (06/10 0523) SpO2:  [98 %-100 %] 100 % (06/10 0523) Weight:  [111.131 kg (245 lb)] 111.131 kg (245 lb) (06/09 1530)  Intake/Output from previous day:  Intake/Output Summary (Last 24 hours) at 02/10/13 0737 Last data filed at 02/10/13 0700  Gross per 24 hour  Intake   4895 ml  Output   1280 ml  Net   3615 ml     Labs:  Recent Labs  02/10/13 0420  HGB 10.7*    Recent Labs  02/10/13 0420  WBC 9.0  RBC 3.89  HCT 32.1*  PLT 267    Recent Labs  02/10/13 0420  NA 135  K 3.1*  CL 99  CO2 28  BUN 15  CREATININE 0.76  GLUCOSE 142*  CALCIUM 8.7   No results found for this basename: LABPT, INR,  in the last 72 hours  EXAM General - Patient is Alert, Appropriate and Oriented Extremity - Neurovascular intact Sensation intact distally Dorsiflexion/Plantar flexion intact Dressing - dressing C/D/I Motor Function - intact, moving foot and toes well on exam.  Hemovac pulled without difficulty.  Past Medical History  Diagnosis Date  . Colon polyps   . Hypertension   . Varicose veins     both legs  . Irritable bowel   . Arthritis   . Headache(784.0)     tension, or allergy headaches  . Tinnitus     both ears most of the time  . History of kidney stones   . Stiffness of joint, shoulder region     left    Assessment/Plan: 1 Day Post-Op Procedure(s) (LRB): RIGHT TOTAL KNEE ARTHROPLASTY (Right) Active  Problems:   Postop Hypokalemia   Postoperative anemia due to acute blood loss  Estimated body mass index is 43.41 kg/(m^2) as calculated from the following:   Height as of this encounter: 5\' 3"  (1.6 m).   Weight as of this encounter: 111.131 kg (245 lb). Advance diet Up with therapy Plan for discharge tomorrow Discharge home with home health  DVT Prophylaxis - Xarelto Weight-Bearing as tolerated to right leg No vaccines. D/C O2 and Pulse OX and try on Room [address]  Patrica Duel 02/10/2013, 7:37 AM

## 2013-02-10 NOTE — Progress Notes (Signed)
02/10/13 1600  PT Visit Information  Last PT Received On 02/10/13  Assistance Needed +1  PT Time Calculation  PT Start Time 1455  PT Stop Time 1521  PT Time Calculation (min) 26 min  Subjective Data  Patient Stated Goal home  Precautions  Precautions Knee;Fall  Required Braces or Orthoses Knee Immobilizer - Right  Knee Immobilizer - Right Discontinue once straight leg raise with < 10 degree lag  Restrictions  Weight Bearing Restrictions Yes  RLE Weight Bearing WBAT  Cognition  Arousal/Alertness Awake/alert  Behavior During Therapy WFL for tasks assessed/performed  Overall Cognitive Status Within Functional Limits for tasks assessed  Bed Mobility  Bed Mobility Sit to Supine;Supine to Sit  Supine to Sit 4: Min assist  Sit to Supine 4: Min assist  Details for Bed Mobility Assistance cues for technique  Transfers  Transfers Sit to Stand;Stand to Sit  Sit to Stand 4: Min guard  Stand to Sit 4: Min guard  Details for Transfer Assistance cues hand placement  Ambulation/Gait  Ambulation/Gait Assistance 4: Min guard  Ambulation Distance (Feet) 10 Feet (x 2)  Assistive device Rolling walker  Ambulation/Gait Assistance Details  cues for sequence   Gait Pattern Decreased stride length  Total Joint Exercises  Ankle Circles/Pumps AROM;Both;10 reps  Quad Sets AROM;Both;10 reps  Heel Slides AROM;Right;10 reps  Hip ABduction/ADduction AROM;Right;10 reps  Straight Leg Raises AROM;Right;10 reps  Goniometric ROM 73*  PT - End of Session  Equipment Utilized During Treatment Gait belt;Right knee immobilizer  Activity Tolerance Patient tolerated treatment well  Patient left in bed;with call bell/phone within reach  Nurse Communication Mobility status  PT - Assessment/Plan  Comments on Treatment Session progressing; wants to go home tomorrow;    PT Plan Discharge plan remains appropriate  PT Frequency 7X/week  Follow Up Recommendations Home health PT  PT equipment Rolling walker with  5" wheels  Acute Rehab PT Goals  Time For Goal Achievement 02/17/13  Potential to Achieve Goals Good

## 2013-02-10 NOTE — Care Management Note (Addendum)
    Page 1 of 2   02/11/2013     4:03:23 PM   CARE MANAGEMENT NOTE 02/11/2013  Patient:  Carla Schmitt, Carla Schmitt   Account Number:  [id number]  Date Initiated:  02/10/2013  Documentation initiated by:  Colleen Can  Subjective/Objective Assessment:   dx osteoarthritis right knee; total knee replacemnt     Action/Plan:   CM spoke with patient. Plans are for her to go to her sister's home for a week or two while she recuperates. States husband works. She is requesting agency that provided hhpt previously ([facility]). Has seated rolling walker.   Anticipated DC Date:  02/12/2013   Anticipated DC Plan:  HOME W HOME HEALTH SERVICES      DC Planning Services  CM consult      PAC Choice  DURABLE MEDICAL EQUIPMENT  HOME HEALTH   Choice offered to / List presented to:  C-1 Patient   DME arranged  [facility] Hurst      DME agency  [facility]     HH arranged  HH-2 PT      [facility] agency  [facility]   Status of service:  Completed, signed off Medicare Important Message given?   (If response is "NO", the following Medicare IM given date fields will be blank) Date Medicare IM given:   Date Additional Medicare IM given:    Discharge Disposition:  HOME W HOME HEALTH SERVICES  Per UR Regulation:  Reviewed for med. necessity/level of care/duration of stay  If discussed at Long Length of Stay Meetings, dates discussed:    Comments:  02/11/2013 Colleen Can BSN RN CCM [phone] RW was recommended by physical therapy. [facility] notified and rw delivered to pt's room. [facility] will provide HHpt services with start day of day after discharge.   02/10/2013 Colleen Can BSN RN CCM [phone] Tct [facility] who advised that HHpt not available till weekend so will not be able to provide services. Tct [facility] who can provide HHpt services. CM will f/u for DMe recommendations.

## 2013-02-10 NOTE — Progress Notes (Signed)
02/10/13 1113  PT Visit Information  Last PT Received On 02/10/13  Assistance Needed +1  PT Time Calculation  PT Start Time 1109  PT Stop Time 1134  PT Time Calculation (min) 25 min  Subjective Data  Patient Stated Goal home  Precautions  Precautions Knee;Fall  Required Braces or Orthoses Knee Immobilizer - Right  Restrictions  RLE Weight Bearing WBAT  Home Living  Lives With Other (Comment)  Available Help at Discharge Family  Type of Home Apartment  Home Access Level entry  Home Layout One level  Home Adaptive Equipment Bedside commode/3-in-1;Walker - rolling  Additional Comments pt plan is to go to sister's home   Prior Function  Level of Independence Independent  Cognition  Arousal/Alertness Awake/alert  Behavior During Therapy WFL for tasks assessed/performed  Overall Cognitive Status Within Functional Limits for tasks assessed  Bed Mobility  Bed Mobility Supine to Sit  Supine to Sit 4: Min assist  Details for Bed Mobility Assistance cues for technique  Transfers  Transfers Sit to Stand;Stand to Sit  Sit to Stand 4: Min guard  Stand to Sit 4: Min guard  Details for Transfer Assistance cues hand placement  Ambulation/Gait  Ambulation/Gait Assistance 4: Min guard  Ambulation Distance (Feet) 50 Feet  Assistive device Rolling walker  Ambulation/Gait Assistance Details cues for sequence  Gait Pattern Decreased stride length  Total Joint Exercises  Ankle Circles/Pumps AROM;Both;10 reps  Quad Sets AROM;Both;10 reps  PT - End of Session  Equipment Utilized During Treatment Gait belt  Activity Tolerance Patient tolerated treatment well  Patient left in chair;with call bell/phone within reach;with family/visitor present  Nurse Communication Mobility status  PT Assessment  Clinical Impression Statement pt is s/p R TKA and will benefit form PT in acute setting; plan is to go home with sister  PT Recommendation/Assessment Patient needs continued PT services  PT Problem  List Decreased strength;Decreased range of motion;Decreased activity tolerance;Decreased mobility;Decreased knowledge of use of DME  PT Therapy Diagnosis  Difficulty walking  PT Plan  PT Frequency 7X/week  PT    PT EVAL  Treatment/Interventions DME instruction;Gait training;Functional mobility training;Therapeutic activities;Therapeutic exercise;Patient/family education  PT Recommendation  Follow Up Recommendations Home health PT  PT equipment Rolling walker with 5" wheels  Individuals Consulted  Consulted and Agree with Results and Recommendations Patient  Acute Rehab PT Goals  PT Goal Formulation With patient  Time For Goal Achievement 02/17/13  Potential to Achieve Goals Good  Pt will go Supine/Side to Sit with supervision  PT Goal: Supine/Side to Sit - Progress Goal set today  Pt will go Sit to Stand with supervision  PT Goal: Sit to Stand - Progress Goal set today  Pt will Ambulate 51 - 150 feet;with supervision;with rolling walker  PT Goal: Ambulate - Progress Goal set today  Pt will Go Up / Down Stairs 1-2 stairs;with min assist;with least restrictive assistive device  PT Goal: Up/Down Stairs - Progress Goal set today  PT General Charges  $$ ACUTE PT VISIT 1 Procedure  PT Evaluation  $Initial PT Evaluation Tier I 1 Procedure  PT Treatments  $Gait Training 23-37 mins

## 2013-02-11 LAB — CBC
HCT: 31.3 % — ABNORMAL LOW (ref 36.0–46.0)
Hemoglobin: 10.8 g/dL — ABNORMAL LOW (ref 12.0–15.0)
MCH: 27.9 pg (ref 26.0–34.0)
MCV: 80.9 fL (ref 78.0–100.0)
Platelets: 230 10*3/uL (ref 150–400)
RBC: 3.87 MIL/uL (ref 3.87–5.11)
WBC: 11.5 10*3/uL — ABNORMAL HIGH (ref 4.0–10.5)

## 2013-02-11 LAB — BASIC METABOLIC PANEL
CO2: 30 mEq/L (ref 19–32)
Calcium: 8.9 mg/dL (ref 8.4–10.5)
Chloride: 99 mEq/L (ref 96–112)
Creatinine, Ser: 0.8 mg/dL (ref 0.50–1.10)
Glucose, Bld: 111 mg/dL — ABNORMAL HIGH (ref 70–99)

## 2013-02-11 MED ORDER — OXYCODONE HCL 5 MG PO TABS: 5.0000 mg | ORAL_TABLET | ORAL | Status: DC | PRN

## 2013-02-11 MED ORDER — RIVAROXABAN 10 MG PO TABS: 10.0000 mg | ORAL_TABLET | Freq: Every day | ORAL | Status: DC

## 2013-02-11 MED ORDER — TRAMADOL HCL 50 MG PO TABS: 50.0000 mg | ORAL_TABLET | Freq: Four times a day (QID) | ORAL | Status: DC | PRN

## 2013-02-11 MED ORDER — METHOCARBAMOL 500 MG PO TABS: 500.0000 mg | ORAL_TABLET | Freq: Four times a day (QID) | ORAL | Status: DC | PRN

## 2013-02-11 NOTE — Discharge Summary (Signed)
Physician Discharge Summary   Patient ID: Carla Schmitt MRN: [id number] DOB/AGE: 10/15/1945 67 y.o.  Admit date: 02/09/2013 Discharge date: 02/11/2013  Primary Diagnosis:  Osteoarthritis Right knee  Admission Diagnoses:  Past Medical History  Diagnosis Date  . Colon polyps   . Hypertension   . Varicose veins     both legs  . Irritable bowel   . Arthritis   . Headache(784.0)     tension, or allergy headaches  . Tinnitus     both ears most of the time  . History of kidney stones   . Stiffness of joint, shoulder region     left   Discharge Diagnoses:   Active Problems:   Postop Hypokalemia   Postoperative anemia due to acute blood loss  Estimated body mass index is 43.41 kg/(m^2) as calculated from the following:   Height as of this encounter: 5\' 3"  (1.6 m).   Weight as of this encounter: 111.131 kg (245 lb).  Procedure:  Procedure(s) (LRB): RIGHT TOTAL KNEE ARTHROPLASTY (Right)   Consults: None  HPI: Carla Schmitt is a 67 y.o. year old female with end stage OA of her right knee with progressively worsening pain and dysfunction. She has constant pain, with activity and at rest and significant functional deficits with difficulties even with ADLs. She has had extensive non-op management including analgesics, injections of cortisone and viscosupplements, and home exercise program, but remains in significant pain with significant dysfunction.Radiographs show bone on bone arthritis all 3 compartments. She presents now for right Total Knee Arthroplasty.   Laboratory Data: Admission on 02/09/2013  Component Date Value Range Status  . WBC 02/10/2013 9.0  4.0 - 10.5 K/uL Final  . RBC 02/10/2013 3.89  3.87 - 5.11 MIL/uL Final  . Hemoglobin 02/10/2013 10.7* 12.0 - 15.0 g/dL Final  . HCT 40/98/1191 32.1* 36.0 - 46.0 % Final  . MCV 02/10/2013 82.5  78.0 - 100.0 fL Final  . MCH 02/10/2013 27.5  26.0 - 34.0 pg Final  . MCHC 02/10/2013 33.3  30.0 - 36.0 g/dL Final  . RDW 47/82/9562  15.1  11.5 - 15.5 % Final  . Platelets 02/10/2013 267  150 - 400 K/uL Final  . Sodium 02/10/2013 135  135 - 145 mEq/L Final  . Potassium 02/10/2013 3.1* 3.5 - 5.1 mEq/L Final  . Chloride 02/10/2013 99  96 - 112 mEq/L Final  . CO2 02/10/2013 28  19 - 32 mEq/L Final  . Glucose, Bld 02/10/2013 142* 70 - 99 mg/dL Final  . BUN 13/04/6577 15  6 - 23 mg/dL Final  . Creatinine, Ser 02/10/2013 0.76  0.50 - 1.10 mg/dL Final  . Calcium 46/96/2952 8.7  8.4 - 10.5 mg/dL Final  . GFR calc non Af Amer 02/10/2013 87* >90 mL/min Final  . GFR calc Af Amer 02/10/2013 >90  >90 mL/min Final   Comment:                                 The eGFR has been calculated                          using the CKD EPI equation.                          This calculation has not been  validated in all clinical                          situations.                          eGFR's persistently                          <90 mL/min signify                          possible Chronic Kidney Disease.  . WBC 02/11/2013 11.5* 4.0 - 10.5 K/uL Final  . RBC 02/11/2013 3.87  3.87 - 5.11 MIL/uL Final  . Hemoglobin 02/11/2013 10.8* 12.0 - 15.0 g/dL Final  . HCT 16/06/9603 31.3* 36.0 - 46.0 % Final  . MCV 02/11/2013 80.9  78.0 - 100.0 fL Final  . MCH 02/11/2013 27.9  26.0 - 34.0 pg Final  . MCHC 02/11/2013 34.5  30.0 - 36.0 g/dL Final  . RDW 54/05/8118 14.7  11.5 - 15.5 % Final  . Platelets 02/11/2013 230  150 - 400 K/uL Final  . Sodium 02/11/2013 138  135 - 145 mEq/L Final  . Potassium 02/11/2013 3.4* 3.5 - 5.1 mEq/L Final  . Chloride 02/11/2013 99  96 - 112 mEq/L Final  . CO2 02/11/2013 30  19 - 32 mEq/L Final  . Glucose, Bld 02/11/2013 111* 70 - 99 mg/dL Final  . BUN 14/78/2956 9  6 - 23 mg/dL Final  . Creatinine, Ser 02/11/2013 0.80  0.50 - 1.10 mg/dL Final  . Calcium 21/30/8657 8.9  8.4 - 10.5 mg/dL Final  . GFR calc non Af Amer 02/11/2013 76* >90 mL/min Final  . GFR calc Af Amer 02/11/2013 88* >90 mL/min  Final   Comment:                                 The eGFR has been calculated                          using the CKD EPI equation.                          This calculation has not been                          validated in all clinical                          situations.                          eGFR's persistently                          <90 mL/min signify                          possible Chronic Kidney Disease.  Hospital Outpatient Visit on 02/04/2013  Component Date Value Range Status  . aPTT 02/04/2013 33  24 - 37 seconds Final  . WBC 02/04/2013 6.2  4.0 - 10.5 K/uL Final  . RBC 02/04/2013 4.63  3.87 - 5.11 MIL/uL Final  . Hemoglobin 02/04/2013 12.6  12.0 - 15.0 g/dL Final  . HCT 04/54/0981 38.1  36.0 - 46.0 % Final  . MCV 02/04/2013 82.3  78.0 - 100.0 fL Final  . MCH 02/04/2013 27.2  26.0 - 34.0 pg Final  . MCHC 02/04/2013 33.1  30.0 - 36.0 g/dL Final  . RDW 19/14/7829 15.0  11.5 - 15.5 % Final  . Platelets 02/04/2013 328  150 - 400 K/uL Final  . Sodium 02/04/2013 140  135 - 145 mEq/L Final  . Potassium 02/04/2013 3.3* 3.5 - 5.1 mEq/L Final  . Chloride 02/04/2013 101  96 - 112 mEq/L Final  . CO2 02/04/2013 30  19 - 32 mEq/L Final  . Glucose, Bld 02/04/2013 99  70 - 99 mg/dL Final  . BUN 56/21/3086 21  6 - 23 mg/dL Final  . Creatinine, Ser 02/04/2013 0.75  0.50 - 1.10 mg/dL Final  . Calcium 57/84/6962 10.5  8.4 - 10.5 mg/dL Final  . Total Protein 02/04/2013 8.1  6.0 - 8.3 g/dL Final  . Albumin 95/28/4132 4.1  3.5 - 5.2 g/dL Final  . AST 44/09/270 15  0 - 37 U/L Final  . ALT 02/04/2013 10  0 - 35 U/L Final  . Alkaline Phosphatase 02/04/2013 87  39 - 117 U/L Final  . Total Bilirubin 02/04/2013 0.4  0.3 - 1.2 mg/dL Final  . GFR calc non Af Amer 02/04/2013 88* >90 mL/min Final  . GFR calc Af Amer 02/04/2013 >90  >90 mL/min Final   Comment:                                 The eGFR has been calculated                          using the CKD EPI equation.                           This calculation has not been                          validated in all clinical                          situations.                          eGFR's persistently                          <90 mL/min signify                          possible Chronic Kidney Disease.  Marland Kitchen Prothrombin Time 02/04/2013 13.4  11.6 - 15.2 seconds Final  . INR 02/04/2013 1.03  0.00 - 1.49 Final  . ABO/RH(D) 02/04/2013 O POS   Final  . Antibody Screen 02/04/2013 NEG   Final  . Sample Expiration 02/04/2013 02/12/2013   Final  . Color, Urine 02/04/2013 YELLOW  YELLOW Final  . APPearance 02/04/2013 CLEAR  CLEAR Final  . Specific Gravity, Urine 02/04/2013 1.023  1.005 - 1.030 Final  . pH 02/04/2013 5.0  5.0 - 8.0 Final  . Glucose, UA 02/04/2013 NEGATIVE  NEGATIVE  mg/dL Final  . Hgb urine dipstick 02/04/2013 NEGATIVE  NEGATIVE Final  . Bilirubin Urine 02/04/2013 NEGATIVE  NEGATIVE Final  . Ketones, ur 02/04/2013 NEGATIVE  NEGATIVE mg/dL Final  . Protein, ur 69/62/9528 NEGATIVE  NEGATIVE mg/dL Final  . Urobilinogen, UA 02/04/2013 0.2  0.0 - 1.0 mg/dL Final  . Nitrite 41/32/4401 NEGATIVE  NEGATIVE Final  . Leukocytes, UA 02/04/2013 TRACE* NEGATIVE Final  . MRSA, PCR 02/04/2013 NEGATIVE  NEGATIVE Final  . Staphylococcus aureus 02/04/2013 NEGATIVE  NEGATIVE Final   Comment:                                 The Xpert SA Assay (FDA                          approved for NASAL specimens                          in patients over 1 years of age),                          is one component of                          a comprehensive surveillance                          program.  Test performance has                          been validated by [employer] for patients greater                          than or equal to 74 year old.                          It is not intended                          to diagnose infection nor to                          guide or monitor treatment.  . Squamous  Epithelial / LPF 02/04/2013 FEW* RARE Final  . WBC, UA 02/04/2013 0-2  <3 WBC/hpf Final  . Bacteria, UA 02/04/2013 FEW* RARE Final  . Urine-Other 02/04/2013 MUCOUS PRESENT   Final  Appointment on 01/28/2013  Component Date Value Range Status  . Sodium 01/28/2013 139  135 - 145 mEq/L Final  . Potassium 01/28/2013 4.0  3.5 - 5.1 mEq/L Final  . Chloride 01/28/2013 103  96 - 112 mEq/L Final  . CO2 01/28/2013 27  19 - 32 mEq/L Final  . Glucose, Bld 01/28/2013 93  70 - 99 mg/dL Final  . BUN 02/72/5366 24* 6 - 23 mg/dL Final  . Creatinine, Ser 01/28/2013 0.9  0.4 - 1.2 mg/dL Final  . Calcium 44/11/4740 10.0  8.4 - 10.5 mg/dL Final  . GFR 59/56/3875 81.90  >60.00 mL/min Final  X-Rays:Dg Chest 2 View  02/04/2013   *RADIOLOGY REPORT*  Clinical Data: Preop for total right knee arthroplasty.  CHEST - 2 VIEW  Comparison: 10/29/2011.  Findings: The cardiac silhouette, mediastinal and hilar contours are normal and stable.  The lungs are clear.  No pleural effusion. The bony thorax is intact.  IMPRESSION: No acute cardiopulmonary findings.   Original Report Authenticated By: Rudie Meyer, M.D.    EKG: Orders placed during the hospital encounter of 02/04/13  . EKG 12-LEAD  . EKG 12-LEAD     Hospital Course: Carla Schmitt is a 67 y.o. who was admitted to [facility]. They were brought to the operating room on 02/09/2013 and underwent Procedure(s): RIGHT TOTAL KNEE ARTHROPLASTY.  Patient tolerated the procedure well and was later transferred to the recovery room and then to the orthopaedic floor for postoperative care.  They were given PO and IV analgesics for pain control following their surgery.  They were given 24 hours of postoperative antibiotics of  Anti-infectives   Start     Dose/Rate Route Frequency Ordered Stop   02/10/13 0100  vancomycin (VANCOCIN) IVPB 1000 mg/200 mL premix     1,000 mg 200 mL/hr over 60 Minutes Intravenous Every 12 hours 02/09/13 1546 02/10/13 0128   02/09/13  0922  vancomycin (VANCOCIN) 1,500 mg in sodium chloride 0.9 % 500 mL IVPB     1,500 mg 250 mL/hr over 120 Minutes Intravenous On call to O.R. 02/09/13 4098 02/09/13 1222     and started on DVT prophylaxis in the form of Xarelto.   PT and OT were ordered for total joint protocol.  Discharge planning consulted to help with postop disposition and equipment needs.  Patient had a rough night on the evening of surgery with moderate to severe pain.  They started to get up OOB with therapy on day one walking about 50 feet. Hemovac drain was pulled without difficulty.  Patient was doing better on day two. Continued to work with therapy into day two.  Dressing was changed on day two and the incision was healing well.  Patient was seen in rounds by Dr. Lequita Halt and was ready to go home.   Discharge Medications: Prior to Admission medications   Medication Sig Start Date End Date Taking? Authorizing Provider  amLODipine (NORVASC) 5 MG tablet Take 5 mg by mouth at bedtime.   Yes Historical Provider, MD  beclomethasone (BECONASE-AQ) 42 MCG/SPRAY nasal spray Place 2 sprays into the nose 2 (two) times daily. 09/30/12  Yes Kristian Covey, MD  losartan-hydrochlorothiazide (HYZAAR) 100-25 MG per tablet Take 1 tablet by mouth daily. 01/30/13  Yes Kristian Covey, MD  methocarbamol (ROBAXIN) 500 MG tablet Take 1 tablet (500 mg total) by mouth every 6 (six) hours as needed. 02/11/13   Alexzandrew Perkins, PA-C  oxyCODONE (OXY IR/ROXICODONE) 5 MG immediate release tablet Take 1-2 tablets (5-10 mg total) by mouth every 3 (three) hours as needed. 02/11/13   Alexzandrew Julien Girt, PA-C  rivaroxaban (XARELTO) 10 MG TABS tablet Take 1 tablet (10 mg total) by mouth daily with breakfast. Take Xarelto for two and a half more weeks, then discontinue Xarelto. 02/11/13   Alexzandrew Perkins, PA-C  traMADol (ULTRAM) 50 MG tablet Take 1-2 tablets (50-100 mg total) by mouth every 6 (six) hours as needed (mild pain). 02/11/13   Alexzandrew  Julien Girt, PA-C    Diet: Cardiac diet Activity:WBAT Follow-up:in 2 weeks Disposition - Home Discharged Condition: good   Discharge Orders   Future Appointments  Provider Department Dept Phone   04/30/2013 11:00 AM Kristian Covey, MD [facility] [place] [phone]   05/01/2013 3:00 PM Kristian Covey, MD [facility] [place] [phone]   Future Orders Complete By Expires     Call MD / Call 911  As directed     Comments:      If you experience chest pain or shortness of breath, CALL 911 and be transported to the hospital emergency room.  If you develope a fever above 101 F, pus (white drainage) or increased drainage or redness at the wound, or calf pain, call your surgeon's office.    Change dressing  As directed     Comments:      Change dressing daily with sterile 4 x 4 inch gauze dressing and apply TED hose. Do not submerge the incision under water.    Constipation Prevention  As directed     Comments:      Drink plenty of fluids.  Prune juice may be helpful.  You may use a stool softener, such as Colace (over the counter) 100 mg twice a day.  Use MiraLax (over the counter) for constipation as needed.    Diet - low sodium heart healthy  As directed     Discharge instructions  As directed     Comments:      Pick up stool softner and laxative for home. Do not submerge incision under water. May shower. Continue to use ice for pain and swelling from surgery.  Take Xarelto for two and a half more weeks, then discontinue Xarelto.    Do not put a pillow under the knee. Place it under the heel.  As directed     Do not sit on low chairs, stoools or toilet seats, as it may be difficult to get up from low surfaces  As directed     Driving restrictions  As directed     Comments:      No driving until released by the physician.    Increase activity slowly as tolerated  As directed     Lifting restrictions  As directed     Comments:      No lifting until  released by the physician.    Patient may shower  As directed     Comments:      You may shower without a dressing once there is no drainage.  Do not wash over the wound.  If drainage remains, do not shower until drainage stops.    TED hose  As directed     Comments:      Use stockings (TED hose) for 3 weeks on both leg(s).  You may remove them at night for sleeping.    Weight bearing as tolerated  As directed         Medication List    TAKE these medications       amLODipine 5 MG tablet  Commonly known as:  NORVASC  Take 5 mg by mouth at bedtime.     beclomethasone 42 MCG/SPRAY nasal spray  Commonly known as:  BECONASE-AQ  Place 2 sprays into the nose 2 (two) times daily.     losartan-hydrochlorothiazide 100-25 MG per tablet  Commonly known as:  HYZAAR  Take 1 tablet by mouth daily.     methocarbamol 500 MG tablet  Commonly known as:  ROBAXIN  Take 1 tablet (500 mg total) by mouth every 6 (six) hours as needed.     oxyCODONE  5 MG immediate release tablet  Commonly known as:  Oxy IR/ROXICODONE  Take 1-2 tablets (5-10 mg total) by mouth every 3 (three) hours as needed.     rivaroxaban 10 MG Tabs tablet  Commonly known as:  XARELTO  Take 1 tablet (10 mg total) by mouth daily with breakfast. Take Xarelto for two and a half more weeks, then discontinue Xarelto.     traMADol 50 MG tablet  Commonly known as:  ULTRAM  Take 1-2 tablets (50-100 mg total) by mouth every 6 (six) hours as needed (mild pain).           Follow-up Information   Follow up with Loanne Drilling, MD. Schedule an appointment as soon as possible for a visit on 02/24/2013.   Contact information:   [address] [place] [address] [phone]       Signed: Patrica Duel 02/11/2013, 7:50 AM

## 2013-02-11 NOTE — Progress Notes (Signed)
Physical Therapy Treatment Patient Details Name: Carla Schmitt MRN: [id number] DOB: [birth year] Today's Date: 02/11/2013 Time: 4098-1191 PT Time Calculation (min): 31 min  PT Assessment / Plan / Recommendation Comments on Treatment Session  POD @ 2 R TKR progressing very well.  Pt had L TKR prior and is very knowledgable.  Amb pt and performed TE's.  No steps as pt reports she is going to her sister's house with no steps/stairs.  Pt plans to D/C today.    Follow Up Recommendations  Home health PT     Does the patient have the potential to tolerate intense rehabilitation     Barriers to Discharge        Equipment Recommendations       Recommendations for Other Services    Frequency 7X/week   Plan Discharge plan remains appropriate    Precautions / Restrictions Precautions Precautions: Knee;Fall Required Braces or Orthoses: Knee Immobilizer - Right Knee Immobilizer - Right: Discontinue once straight leg raise with < 10 degree lag Restrictions Weight Bearing Restrictions: No RLE Weight Bearing: Weight bearing as tolerated   Pertinent Vitals/Pain C/o 4/10 knee pain with TE's ICE applied    Mobility  Bed Mobility Bed Mobility: Not assessed Supine to Sit: 4: Min assist;HOB elevated Details for Bed Mobility Assistance: Pt OOB in recliner Transfers Transfers: Sit to Stand;Stand to Sit Sit to Stand: 6: Modified independent (Device/Increase time);From chair/3-in-1;From toilet Stand to Sit: 6: Modified independent (Device/Increase time);To chair/3-in-1;To toilet Details for Transfer Assistance: good safety cognition Ambulation/Gait Ambulation/Gait Assistance: 6: Modified independent (Device/Increase time) Ambulation Distance (Feet): 225 Feet Assistive device: Rolling walker Ambulation/Gait Assistance Details: good alternating gait with co 4/10 pain.  Only required increased time. Gait Pattern: Decreased stride length Gait velocity: decreased    Exercises   Total Knee  Replacement TE's 10 reps B LE ankle pumps 10 reps knee presses 10 reps heel slides  10 reps SAQ's 10 reps SLR's 10 reps ABD Followed by ICE    PT Goals                                                              progressing    Visit Information  Last PT Received On: 02/11/13 Assistance Needed: +1    Subjective Data      Cognition  Cognition Arousal/Alertness: Awake/alert Behavior During Therapy: WFL for tasks assessed/performed Overall Cognitive Status: Within Functional Limits for tasks assessed    Balance   good  End of Session PT - End of Session Equipment Utilized During Treatment: Gait belt Activity Tolerance: Patient tolerated treatment well Patient left: in chair;with call bell/phone within reach   Felecia Shelling  PTA [facility]  Acute  Rehab Pager      [phone]

## 2013-02-11 NOTE — Evaluation (Signed)
Occupational Therapy Evaluation Patient Details Name: Carla Schmitt MRN: [id number] DOB: 1946-06-04 Today's Date: 02/11/2013 Time: 4098-1191 OT Time Calculation (min): 19 min  OT Assessment / Plan / Recommendation Clinical Impression  This 67 year old female was admitted for R TKA.  All education was completed.  Pt does not need any further OT at this time.      OT Assessment  Patient does not need any further OT services    Follow Up Recommendations  No OT follow up    Barriers to Discharge      Equipment Recommendations  3 in 1 bedside comode (family prefers to get it themselves)    Recommendations for Other Services    Frequency       Precautions / Restrictions Precautions Precautions: Knee;Fall Required Braces or Orthoses: Knee Immobilizer - Right Knee Immobilizer - Right: Discontinue once straight leg raise with < 10 degree lag Restrictions Weight Bearing Restrictions: No RLE Weight Bearing: Weight bearing as tolerated   Pertinent Vitals/Pain No c/o pain.    ADL  Lower Body Bathing: Minimal assistance Where Assessed - Lower Body Bathing: Supported sit to stand Lower Body Dressing: Moderate assistance Where Assessed - Lower Body Dressing: Supported sit to stand Toilet Transfer: Performed;Min Pension scheme manager Method: Sit to Barista: Comfort height toilet Equipment Used: Rolling walker Transfers/Ambulation Related to ADLs: Pt ambulated to bathroom.  She was able to get up from comfort height commode with rail  with min guardl, but sister has a low commode.  Used 16" shower seat to simulate this, but pt needed mod A to stand from this.  Family member will get her a 3:1 ADL Comments: Pt is able to perform UB adls with set up.  Educated on tub readiness.  She will sponge bathe initially.  She has a seat but has to step over tub to access it.      OT Diagnosis:    OT Problem List:   OT Treatment Interventions:     OT Goals    Visit  Information  Last OT Received On: 02/11/13 Assistance Needed: +1    Subjective Data  Subjective: I'm going to my sister's.  She has a regular toilet Patient Stated Goal: none stated.  Agreeable to OT   Prior Functioning     Home Living Lives With: Family Available Help at Discharge: Family Bathroom Shower/Tub: Tub/shower unit Bathroom Toilet: Standard Home Adaptive Equipment: Walker - rolling Additional Comments: plans to go to sisters.  Does not have a 3:1 Prior Function Level of Independence: Independent Communication Communication: No difficulties         Vision/Perception     Cognition  Cognition Arousal/Alertness: Awake/alert Behavior During Therapy: WFL for tasks assessed/performed Overall Cognitive Status: Within Functional Limits for tasks assessed    Extremity/Trunk Assessment Right Upper Extremity Assessment RUE ROM/Strength/Tone: WFL for tasks assessed Left Upper Extremity Assessment LUE ROM/Strength/Tone: WFL for tasks assessed     Mobility Bed Mobility Supine to Sit: 4: Min assist;HOB elevated Details for Bed Mobility Assistance: did not need cues today Transfers Sit to Stand: 4: Min guard (except low toilet, mod A) Stand to Sit: 4: Min guard Details for Transfer Assistance: cues hand placement     Exercise     Balance     End of Session OT - End of Session Activity Tolerance: Patient tolerated treatment well Patient left: in chair;with call bell/phone within reach;with family/visitor present CPM Right Knee CPM Right Knee: Off  GO  Timoty Bourke 02/11/2013, 11:14 AM Marica Otter, OTR/L [phone] 02/11/2013

## 2013-02-11 NOTE — Progress Notes (Signed)
   Subjective: 2 Days Post-Op Procedure(s) (LRB): RIGHT TOTAL KNEE ARTHROPLASTY (Right) Patient reports pain as mild.   Plan is to go Home after hospital stay.  Objective: Vital signs in last 24 hours: Temp:  [99.1 F (37.3 C)-100.4 F (38 C)] 99.8 F (37.7 C) (06/11 0550) Pulse Rate:  [88-110] 110 (06/11 0550) Resp:  [15-16] 16 (06/11 0550) BP: (139-196)/(58-79) 175/67 mmHg (06/11 0550) SpO2:  [91 %-100 %] 91 % (06/11 0550)  Intake/Output from previous day:  Intake/Output Summary (Last 24 hours) at 02/11/13 0806 Last data filed at 02/11/13 0551  Gross per 24 hour  Intake    600 ml  Output   1000 ml  Net   -400 ml    Intake/Output this shift:    Labs:  Recent Labs  02/10/13 0420 02/11/13 0433  HGB 10.7* 10.8*    Recent Labs  02/10/13 0420 02/11/13 0433  WBC 9.0 11.5*  RBC 3.89 3.87  HCT 32.1* 31.3*  PLT 267 230    Recent Labs  02/10/13 0420 02/11/13 0433  NA 135 138  K 3.1* 3.4*  CL 99 99  CO2 28 30  BUN 15 9  CREATININE 0.76 0.80  GLUCOSE 142* 111*  CALCIUM 8.7 8.9   No results found for this basename: LABPT, INR,  in the last 72 hours  EXAM General - Patient is Alert, Appropriate and Oriented Extremity - Neurologically intact Neurovascular intact Incision: dressing C/D/I No cellulitis present Compartment soft Dressing/Incision - clean, dry, no drainage Motor Function - intact, moving foot and toes well on exam.   Past Medical History  Diagnosis Date  . Colon polyps   . Hypertension   . Varicose veins     both legs  . Irritable bowel   . Arthritis   . Headache(784.0)     tension, or allergy headaches  . Tinnitus     both ears most of the time  . History of kidney stones   . Stiffness of joint, shoulder region     left    Assessment/Plan: 2 Days Post-Op Procedure(s) (LRB): RIGHT TOTAL KNEE ARTHROPLASTY (Right) Active Problems:   Postop Hypokalemia   Postoperative anemia due to acute blood loss   Advance diet Up with  therapy D/C IV fluids Discharge home with home health  DVT Prophylaxis - Xarelto Weight-Bearing as tolerated to right leg  Jayci Ellefson V 02/11/2013, 8:06 AM

## 2013-02-12 ENCOUNTER — Telehealth: Payer: Self-pay | Admitting: Family Medicine

## 2013-02-12 NOTE — Telephone Encounter (Signed)
Monitor for another week.  ?exacerbated by post op pain?  If still >150/90 in one week let me know and we will adjust her meds.

## 2013-02-12 NOTE — Telephone Encounter (Signed)
[facility] wanted to inform MD pt's BP is still running high (170/70) She is aware BP med was changed. Verlon Au will keep [place] informed.

## 2013-02-12 NOTE — Telephone Encounter (Signed)
Carla Schmitt informed on VM

## 2013-03-03 ENCOUNTER — Ambulatory Visit (HOSPITAL_COMMUNITY)
Admission: RE | Admit: 2013-03-03 | Discharge: 2013-03-03 | Disposition: A | Discharge status: Home / Self Care | Payer: Medicare Other | Source: Ambulatory Visit | Attending: Orthopedic Surgery | Admitting: Orthopedic Surgery

## 2013-03-03 DIAGNOSIS — M25669 Stiffness of unspecified knee, not elsewhere classified: Secondary | ICD-10-CM | POA: Insufficient documentation

## 2013-03-03 DIAGNOSIS — R262 Difficulty in walking, not elsewhere classified: Secondary | ICD-10-CM | POA: Insufficient documentation

## 2013-03-03 DIAGNOSIS — IMO0001 Reserved for inherently not codable concepts without codable children: Secondary | ICD-10-CM | POA: Insufficient documentation

## 2013-03-03 DIAGNOSIS — I10 Essential (primary) hypertension: Secondary | ICD-10-CM | POA: Insufficient documentation

## 2013-03-03 DIAGNOSIS — M25569 Pain in unspecified knee: Secondary | ICD-10-CM | POA: Insufficient documentation

## 2013-03-03 NOTE — Evaluation (Addendum)
Physical Therapy Evaluation  Patient Details  Name: ADRIANE GABBERT MRN: [id number] Date of Birth: 28-May-1946  Today's Date: 03/03/2013 Time: 1350-1430 PT Time Calculation (min): 40 min Charges: 1 evaluation  TE: 1420-1430            Visit#: 1 of 12  Re-eval: 04/02/13 Assessment Diagnosis: Rt TKR Surgical Date: 02/09/13 Next MD Visit: Dr. Despina Hick - July 18th Prior Therapy: HHPT  Authorization: BCBS Medicare    Authorization Time Period:    Authorization Visit#: 1 of 10   Past Medical History:  Past Medical History  Diagnosis Date  . Colon polyps   . Hypertension   . Varicose veins     both legs  . Irritable bowel   . Arthritis   . Headache(784.0)     tension, or allergy headaches  . Tinnitus     both ears most of the time  . History of kidney stones   . Stiffness of joint, shoulder region     left   Past Surgical History:  Past Surgical History  Procedure Laterality Date  . Foot surgery  many yrs ago    both feet  . Abdominal hysterectomy  02-09-2010    TA[facility]  . Total knee arthroplasty  11/05/2011    Procedure: TOTAL KNEE ARTHROPLASTY;  Surgeon: Loanne Drilling, MD;  Location: [facility];  Service: Orthopedics;  Laterality: Left;  . Total knee arthroplasty Right 02/09/2013    Procedure: RIGHT TOTAL KNEE ARTHROPLASTY;  Surgeon: Loanne Drilling, MD;  Location: [facility];  Service: Orthopedics;  Laterality: Right;    Subjective Symptoms/Limitations Pertinent History: Pt is referred to PT s/p R TKR on 02/10/11 and has a hx of Lt TKR 3/13 and had significant pain after her knee surgery.  Her c/co are significant pain to her knee, weakness to her leg, and difficulty walking.  Patient Stated Goals: I want to be able to do my normal things.  household chores, errands, driving, exercise Pain Assessment Currently in Pain?: Yes Pain Score: 7  Pain Location: Knee Pain Orientation: Right Pain Type: Surgical pain;Acute pain Pain Frequency: Constant Pain Relieving Factors: pain medication  - only helps moderatly.  Effect of Pain on Daily Activities: difficulty with daily chores, exercise and walking.   Precautions/Restrictions  Precautions Precautions: Knee;Fall  Balance Screening Balance Screen Has the patient fallen in the past 6 months: No Has the patient had a decrease in activity level because of a fear of falling? : Yes Is the patient reluctant to leave their home because of a fear of falling? : No  Sensation/Coordination/Flexibility/Functional Tests Functional Tests Functional Tests: Lower extremity functional scale (LEFS): 26/80   RLE AROM (degrees) Right Knee Extension: 3 Right Knee Flexion: 80 RLE PROM (degrees) Right Knee Extension: 0 Right Knee Flexion: 100 RLE Strength Right Hip Flexion:  (4+/5) Right Hip Extension:  (4+/5) Right Knee Flexion: 5/5 Right Knee Extension: 5/5 Palpation Palpation: pain and tenderness to Rt patella and distal quadricep./  Mobility/Balance  Ambulation/Gait Ambulation/Gait Assistance: 6: Modified independent (Device/Increase time) Ambulation/Gait Assistance Details: decreased speed Assistive device: None Gait Pattern: Decreased hip/knee flexion - right Static Standing Balance Single Leg Stance - Right Leg: 0 Single Leg Stance - Left Leg: 0 Tandem Stance - Right Leg: 10 Tandem Stance - Left Leg: 10 Rhomberg - Eyes Opened: 10 Rhomberg - Eyes Closed: 10   Exercise/Treatments Supine Quad Sets: 5 reps Heel Slides: 10 reps Patellar Mobs: demonstrationg education Sidelying Hip ABduction: 10 reps Hip ADduction: 10 reps Prone  Hamstring Curl: 10 reps  Physical Therapy Assessment and Plan PT Assessment and Plan Clinical Impression Statement: Pt is a 67 year old female s/p R TKA with impairments listed below.  Pt will benefit from skilled therapeutic intervention in order to improve on the following deficits: Abnormal gait;Pain;Decreased strength;Impaired perceived functional ability;Decreased range of  motion Rehab Potential: Good PT Frequency: Min 3X/week PT Duration: 4 weeks PT Treatment/Interventions: Gait training;Stair training;Functional mobility training;Therapeutic activities;Therapeutic exercise;Balance training;Neuromuscular re-education;Patient/family education;Manual techniques;Modalities PT Plan: NuStep or Bike for activity tolerance, continue with LE strengthening (squats, heel/toe raises, standing knee flexion) progress towards stair and rocker board.  Progress balance training.  Manual techniques to address pain to patella.     Goals Home Exercise Program Pt/caregiver will Perform Home Exercise Program: independently;with written HEP provided PT Goal: Perform Home Exercise Program - Progress: Goal set today PT Short Term Goals Time to Complete Short Term Goals: 4 weeks PT Short Term Goal 1: Pt will improve her LE strength to [facility] in order to ambulate with approprriate gait mechanics to decreae risk of secondary injury.  PT Short Term Goal 2: Pt will improve her Rt knee AROM 0-110 degrees for greater ease with sit to stand activities.  PT Short Term Goal 3: Pt will report pain less than a 4/10 for 50% of her day for improved QOL. PT Short Term Goal 4: Pt will improve her patellar mobility to WNL with reports of pain less than 4/10.  PT Short Term Goal 5: Pt will improve her LEFS to greater than 40/80 for improved percieved functional ability.   Problem List Patient Active Problem List   Diagnosis Date Noted  . Knee pain 03/03/2013  . Knee stiffness 03/03/2013  . Postoperative anemia due to acute blood loss 02/10/2013  . Postop Hypokalemia 11/07/2011  . Postop Acute blood loss anemia 11/07/2011  . Postop Hyponatremia 11/06/2011  . Hypertension 09/28/2011  . Osteoarthritis, knee 06/04/2011  . Obesity 06/04/2011    PT Plan of Care PT Home Exercise Plan: see scanned report PT Patient Instructions: importance of HEP, manual techniques to decrease pain, answered questions  about diagnosis.  Consulted and Agree with Plan of Care: Patient  GP Functional Assessment Tool Used: LEFS Functional Limitation: Mobility: Walking and moving around Mobility: Walking and Moving Around Current Status [phone]): At least 60 percent but less than 80 percent impaired, limited or restricted Mobility: Walking and Moving Around Goal Status [phone]): At least 20 percent but less than 40 percent impaired, limited or restricted  Lindsey Hommel, MPT, ATC 03/03/2013, 4:38 PM  Physician Documentation Your signature is required to indicate approval of the treatment plan as stated above.  Please sign and either send electronically or make a copy of this report for your files and return this physician signed original.   Please mark one 1.__approve of plan  2. ___approve of plan with the following conditions.   ______________________________                                                          _____________________ Physician Signature  Date  

## 2013-03-10 ENCOUNTER — Ambulatory Visit (HOSPITAL_COMMUNITY)
Admission: RE | Admit: 2013-03-10 | Discharge: 2013-03-10 | Disposition: A | Discharge status: Home / Self Care | Payer: Medicare Other | Source: Ambulatory Visit | Attending: Orthopedic Surgery | Admitting: Orthopedic Surgery

## 2013-03-10 NOTE — Progress Notes (Signed)
Physical Therapy Treatment Patient Details  Name: Carla Schmitt MRN: [id number] Date of Birth: [birth year]  Today's Date: 03/10/2013 Time: 4098-1191 PT Time Calculation (min): 43 min Charge: TE 30' 1515-1545, Manual 12' [phone]  Visit#: 2 of 12  Re-eval: 04/02/13 Assessment Diagnosis: Rt TKR Surgical Date: 02/09/13 Next MD Visit: Dr. Despina Hick - July 18th Prior Therapy: HHPT  Authorization: BCBS Medicare  Authorization Time Period:    Authorization Visit#: 2 of 10   Subjective: Symptoms/Limitations Symptoms: Pt reported compliance with HEP, stated extreme difficulty with s/l adduction exercises with pain in hip.  Pain scale 7/10 Rt knee today.   Pain Assessment Currently in Pain?: Yes Pain Score: 7  Pain Location: Knee Pain Orientation: Right  Precautions/Restrictions  Precautions Precautions: Knee;Fall  Exercise/Treatments Aerobic Stationary Bike: 6' no resistance seat 14-->13 Standing Heel Raises: 10 reps;Limitations Heel Raises Limitations: Heel and Toe raises 10x Knee Flexion: Both;10 reps Functional Squat: 10 reps;Limitations Functional Squat Limitations: minisquats Supine Quad Sets: 10 reps;Limitations Quad Sets Limitations: 5" holds with towel under knee Hip Adduction Isometric: Both;10 reps;Limitations Hip Adduction Isometric Limitations: 10" holds with ball between knees Sidelying Hip ABduction: 10 reps Prone  Hamstring Curl: 10 reps Hip Extension: 10 reps   Manual Therapy Manual Therapy: Joint mobilization Edema Management: Retro massage to reduce edema with LE elevated  Joint Mobilization: Patella mobs A/P and R/L and tib/fib mobs to improve tracking  Physical Therapy Assessment and Plan PT Assessment and Plan Clinical Impression Statement: Began treatment per PT POC for Rt knee strenghtening and ROM exercises  Pt able to demonstrate appropriate technique with all exercises with min cueing for form and technique.  Manual joint mobs and retro  massage complete to improve patella tracking and reduce edema PT Plan: Continue with current POC with NuStep or Bike for activity tolerance, continue with LE strengthening and progress towards stair and rocker board.  Progress balance training.  Manual techniques to address pain to patella.     Goals    Problem List Patient Active Problem List   Diagnosis Date Noted  . Knee pain 03/03/2013  . Knee stiffness 03/03/2013  . Postoperative anemia due to acute blood loss 02/10/2013  . Postop Hypokalemia 11/07/2011  . Postop Acute blood loss anemia 11/07/2011  . Postop Hyponatremia 11/06/2011  . Hypertension 09/28/2011  . Osteoarthritis, knee 06/04/2011  . Obesity 06/04/2011    PT - End of Session Activity Tolerance: Patient tolerated treatment well General Behavior During Therapy: [facility] for tasks assessed/performed  GP    Juel Burrow 03/10/2013, 4:05 PM

## 2013-03-12 ENCOUNTER — Ambulatory Visit (HOSPITAL_COMMUNITY)
Admission: RE | Admit: 2013-03-12 | Discharge: 2013-03-12 | Disposition: A | Discharge status: Home / Self Care | Payer: Medicare Other | Source: Ambulatory Visit | Attending: Orthopedic Surgery | Admitting: Orthopedic Surgery

## 2013-03-12 NOTE — Progress Notes (Signed)
Physical Therapy Treatment Patient Details  Name: Carla Schmitt MRN: [id number] Date of Birth: Jul 24, 1946  Today's Date: 03/12/2013 Time: 4098-1191 PT Time Calculation (min): 41 min Charge;TE 4782-9562  Visit#: 3 of 12  Re-eval: 04/02/13 Assessment Diagnosis: Rt TKR Surgical Date: 02/09/13 Next MD Visit: Dr. Despina Hick - July 18th Prior Therapy: HHPT  Authorization: BCBS Medicare  Authorization Time Period:    Authorization Visit#: 3 of 10   Subjective: Symptoms/Limitations Symptoms: Pt reported no real pain just sore today.   Pain Assessment Currently in Pain?: No/denies  Precautions/Restrictions  Precautions Precautions: Knee;Fall  Exercise/Treatments Aerobic Stationary Bike: NuStep Hill level 1, resistance 2 x 8' SPM average 90 Standing Heel Raises: 15 reps;Limitations Heel Raises Limitations: Heel and Toe raises 10x Knee Flexion: Both;15 reps Functional Squat: 15 reps;Limitations Functional Squat Limitations: minisquats Rocker Board: 2 minutes;Limitations Rocker Board Limitations: R/L intermittent HHA Gait Training: 3' with cueing for heel to toe and equal stance phase Supine Quad Sets: 10 reps;Limitations Quad Sets Limitations: 10" holds Short Arc Quad Sets: 15 reps Hip Adduction Isometric: Both;10 reps;Limitations Hip Adduction Isometric Limitations: 10" holds with ball between knees Bridges: 10 reps     Physical Therapy Assessment and Plan PT Assessment and Plan Clinical Impression Statement: Changed aerobic to nustep for strengthening and activity tolerance without difficulty, pt able to keep SPM>90.  Added rocker board to equalize weight distribution with gait, gait training with min cueing for heel to toe pattern and to equalize stance phase.  Pt continues to require tactile cueing to improve quad activaiton.  Added SAQ for quad strengthening without difficulty following cueing for techniuqe.  Pt plans to ice knee at home form pain and edema control.    PT Plan: Continue with current POC with NuStep or Bike for activity tolerance, continue with LE strengthening and progress towards stair and rocker board.  Progress balance training.  Manual techniques to address pain to patella.     Goals    Problem List Patient Active Problem List   Diagnosis Date Noted  . Knee pain 03/03/2013  . Knee stiffness 03/03/2013  . Postoperative anemia due to acute blood loss 02/10/2013  . Postop Hypokalemia 11/07/2011  . Postop Acute blood loss anemia 11/07/2011  . Postop Hyponatremia 11/06/2011  . Hypertension 09/28/2011  . Osteoarthritis, knee 06/04/2011  . Obesity 06/04/2011    PT - End of Session Activity Tolerance: Patient tolerated treatment well General Behavior During Therapy: [facility] for tasks assessed/performed  GP    Juel Burrow 03/12/2013, 2:31 PM

## 2013-03-16 ENCOUNTER — Ambulatory Visit (HOSPITAL_COMMUNITY)
Admission: RE | Admit: 2013-03-16 | Discharge: 2013-03-16 | Disposition: A | Discharge status: Home / Self Care | Payer: Medicare Other | Source: Ambulatory Visit | Attending: Orthopedic Surgery | Admitting: Orthopedic Surgery

## 2013-03-16 NOTE — Progress Notes (Signed)
Physical Therapy Treatment Patient Details  Name: Carla Schmitt MRN: [id number] Date of Birth: 1946/06/20  Today's Date: 03/16/2013 Time: 1191-4782 PT Time Calculation (min): 39 min Charges: Therex x 30' (1302-1332) Manual x 8' (1332-1340)  Visit#: 4 of 12  Re-eval: 04/02/13  Authorization: BCBS Medicare  Authorization Visit#: 4 of 10   Subjective: Symptoms/Limitations Symptoms: Pt states that she has been working on improve her knee motion at home. Pain Assessment Currently in Pain?: Yes Pain Score: 5  Pain Location: Knee Pain Orientation: Right   Exercise/Treatments Aerobic Stationary Bike: NuStep Hill level 3, resistance 2 x 8' SPM average 90 Standing Heel Raises: 15 reps;Limitations Heel Raises Limitations: Heel and Toe raises 10x Knee Flexion: Both;15 reps Functional Squat: 15 reps;Limitations Functional Squat Limitations: minisquats Rocker Board: 2 minutes;Limitations Rocker Board Limitations: R/L intermittent HHA Supine Quad Sets: 10 reps;Limitations Quad Sets Limitations: 5" holds Short Arc Quad Sets: 2 sets;10 reps;Limitations Water quality scientist Limitations: 10 reps on small bolster; 10 reps on large bolster Knee Extension: PROM Knee Flexion: PROM   Manual Therapy Joint Mobilization: A/P joint mobs completed to right knee to increase ROM and decrease pain; PROM for extension and flexion  Physical Therapy Assessment and Plan PT Assessment and Plan Clinical Impression Statement: Pt presents with increased inflammation and heat in right knee. Pt states that she is icing twice a day. Advised pt to ice 5 times a day to limit inflammation. Pt's motion is limited by pain most likely due to increased inflammation. Pt has low tolerance for PROM secondary to pain. Pt denies cryotherapy secondary to time constraint but states that she will ice her knee immediately when she gets home. PT Plan: Begin bike next session.  Progress balance training.  Manual techniques to  address pain to patella.      Problem List Patient Active Problem List   Diagnosis Date Noted  . Knee pain 03/03/2013  . Knee stiffness 03/03/2013  . Postoperative anemia due to acute blood loss 02/10/2013  . Postop Hypokalemia 11/07/2011  . Postop Acute blood loss anemia 11/07/2011  . Postop Hyponatremia 11/06/2011  . Hypertension 09/28/2011  . Osteoarthritis, knee 06/04/2011  . Obesity 06/04/2011    PT - End of Session Activity Tolerance: Patient tolerated treatment well General Behavior During Therapy: [facility] for tasks assessed/performed  Seth Bake, PTA 03/16/2013, 2:39 PM

## 2013-03-18 ENCOUNTER — Ambulatory Visit (HOSPITAL_COMMUNITY)
Admission: RE | Admit: 2013-03-18 | Discharge: 2013-03-18 | Disposition: A | Discharge status: Home / Self Care | Payer: Medicare Other | Source: Ambulatory Visit | Attending: Orthopedic Surgery | Admitting: Orthopedic Surgery

## 2013-03-18 NOTE — Progress Notes (Addendum)
Physical Therapy Re-evaluation/Treatment  Patient Details  Name: Carla Schmitt MRN: [id number] Date of Birth: October 29, 1945  Today's Date: 03/18/2013 Time: 4098-1191 PT Time Calculation (min): 42 min Charge : TE [id number], UE [phone], MMT (no charge) and ROM Measurement x 1 1425-1428              Visit#: 5 of 12  Re-eval: 04/02/13 Assessment Diagnosis: Rt TKR Surgical Date: 02/09/13 Next MD Visit: Dr. Despina Hick - July 18th Prior Therapy: HHPT  Authorization: BCBS Medicare    Authorization Time Period: G code on 5th visit  Authorization Visit#: 5 of 15   Subjective Symptoms/Limitations Symptoms: Pt stated no real difficulty with functional activities, knee hurts more when I bend it.   Pain Assessment Currently in Pain?: Yes Pain Score: 3  Pain Location: Knee Pain Orientation: Right  Precautions/Restrictions  Precautions Precautions: Knee;Fall  Sensation/Coordination/Flexibility/Functional Tests Functional Tests Functional Tests: Lower extremity functional scale (LEFS): 50/80  (LEFS was 26/80)  RLE AROM (degrees) Right Knee Extension: 3 Right Knee Flexion: 100 RLE PROM (degrees) Right Knee Extension: 0 Right Knee Flexion: 102 RLE Strength Right Hip Flexion:  (4+/5) Right Hip Extension:  (4+/5) Palpation Palpation: pain and tenderness to Rt patella and distal quadricep./  Exercise/Treatments Aerobic Stationary Bike: bike x 8 minutes seat 11 for ROM Standing Heel Raises: 15 reps;Limitations Heel Raises Limitations: Heel and Toe raises 10x Rocker Board: Limitations;3 minutes Rocker Board Limitations: R/L intermittent HHA Other Standing Knee Exercises: Tandem stance 2x 30", Tandem gait 1RT   Modalities Modalities: Ultrasound Ultrasound Ultrasound Location: anterior knee  Ultrasound Parameters: 0.8w/cm2 pulsed x 8 minutes with LE elevated  Ultrasound Goals: Pain  Physical Therapy Assessment and Plan PT Assessment and Plan Clinical Impression Statement:  Re-eval complete prior MD apt.  Mrs Suchan has had 5 OPPT sessions over 2 weeks with the following findings:  Pt is indepdenent with HEP daily.  Functional strength is improving, pt continues to have antalgic gait pattern due to pain.  Noted improved patella mobility with AROM improving 3-100, PROM 0-102.  Pt stated pain increases with flexion.  Pt with improved perceived LE functional scale.  Progressed this session to balance activities with min assistance for LOB episodes and vc-ing to improve spatial awareness.  Added [place] to anterior knee for pain control.   PT Plan: F/U with MD for continue OPPT vs. D/C to HEP.  Next session assess pain relief with [place].  Progress balance training and continue techniques to address pain to patella.      Goals Home Exercise Program PT Goal: Perform Home Exercise Program - Progress: Met (daily) PT Short Term Goals Time to Complete Short Term Goals: 4 weeks PT Short Term Goal 1: Pt will improve her LE strength to [facility] in order to ambulate with approprriate gait mechanics to decreae risk of secondary injury.  Progressing toward goal PT Short Term Goal 2: Pt will improve her Rt knee AROM 0-110 degrees for greater ease with sit to stand activities. Progressing toward goal (3-100) PT Short Term Goal 3: Pt will report pain less than a 4/10 for 50% of her day for improved QOL Progressing toward goal (Pain average 4/10, does increase to 7/10) PT Short Term Goal 4: Pt will improve her patellar mobility to WNL with reports of pain less than 4/10. Progressing toward goal PT Short Term Goal 5: Pt will improve her LEFS to greater than 40/80 for improved percieved functional ability Met (LEFS 50/80)  Problem List Patient Active Problem List  Diagnosis Date Noted  . Knee pain 03/03/2013  . Knee stiffness 03/03/2013  . Postoperative anemia due to acute blood loss 02/10/2013  . Postop Hypokalemia 11/07/2011  . Postop Acute blood loss anemia 11/07/2011  . Postop Hyponatremia  11/06/2011  . Hypertension 09/28/2011  . Osteoarthritis, knee 06/04/2011  . Obesity 06/04/2011    PT - End of Session Activity Tolerance: Patient tolerated treatment well General Behavior During Therapy: WFL for tasks assessed/performed  GP Functional Assessment Tool Used: LEFS 50/80 Mobility: Walking and Moving Around Current Status ([id number]): At least 20 percent but less than 40 percent impaired, limited or restricted Mobility: Walking and Moving Around Goal Status [phone]): At least 1 percent but less than 20 percent impaired, limited or restricted  Juel Burrow; Annett Fabian, MPT, ATC 03/18/2013, 2:59 PM

## 2013-03-23 ENCOUNTER — Ambulatory Visit (HOSPITAL_COMMUNITY)
Admission: RE | Admit: 2013-03-23 | Discharge: 2013-03-23 | Disposition: A | Discharge status: Home / Self Care | Payer: Medicare Other | Source: Ambulatory Visit | Attending: Orthopedic Surgery | Admitting: Orthopedic Surgery

## 2013-03-23 NOTE — Progress Notes (Signed)
Physical Therapy Treatment Patient Details  Name: Carla Schmitt MRN: [id number] Date of Birth: 09-16-1945  Today's Date: 03/23/2013 Time: 1020-1059 PT Time Calculation (min): 39 min  Visit#: 6 of 12  Re-eval: 04/02/13 Charges: Therex x 8' (1020-1028) Manual x 20'(1030-1040) [place] x 8'(1041-1059)   Authorization: BCBS Medicare  Authorization Time Period: G code on 5th visit  Authorization Visit#: 6 of 15   Subjective: Symptoms/Limitations Symptoms: Pt states that MD was pleased with her progress. She feels she is only limited by pain. Pain Assessment Currently in Pain?: Yes Pain Score: 5  Pain Location: Knee Pain Orientation: Right   Exercise/Treatments Aerobic Stationary Bike: Bike x 8 minutes seat 11 for ROM   Modalities Modalities: Ultrasound Manual Therapy Joint Mobilization: A/P joint mobs completed to right knee to increase ROM and decrease pain Ultrasound Ultrasound Location: anterior knee  Ultrasound Parameters: 1 w/cm2 pulsed x 8 minutes with LE elevated  Ultrasound Goals: Pain  Physical Therapy Assessment and Plan PT Assessment and Plan Clinical Impression Statement: Tx focus on decreasing pain. Ultrasound competed to anterior/medial knee to decrease pain and facilitate healing. Manual techniques competed after ultrasound to decrease pain and facial restrictions. Pt reports pain decrease to 2/10 at end of session. PT Plan: Continue PT x 1 wk 3x per wk per MD.    Goals    Problem List Patient Active Problem List   Diagnosis Date Noted  . Knee pain 03/03/2013  . Knee stiffness 03/03/2013  . Postoperative anemia due to acute blood loss 02/10/2013  . Postop Hypokalemia 11/07/2011  . Postop Acute blood loss anemia 11/07/2011  . Postop Hyponatremia 11/06/2011  . Hypertension 09/28/2011  . Osteoarthritis, knee 06/04/2011  . Obesity 06/04/2011    PT - End of Session Activity Tolerance: Patient tolerated treatment well General Behavior During  Therapy: [facility] for tasks assessed/performed  Seth Bake, PTA   03/23/2013, 12:08 PM

## 2013-03-25 ENCOUNTER — Ambulatory Visit (HOSPITAL_COMMUNITY)
Admission: RE | Admit: 2013-03-25 | Discharge: 2013-03-25 | Disposition: A | Discharge status: Home / Self Care | Payer: Medicare Other | Source: Ambulatory Visit | Attending: Orthopedic Surgery | Admitting: Orthopedic Surgery

## 2013-03-25 NOTE — Progress Notes (Signed)
Physical Therapy Treatment Patient Details  Name: Carla Schmitt MRN: [id number] Date of Birth: 1946-08-02  Today's Date: 03/25/2013 Time: 8295-6213 PT Time Calculation (min): 34 min Charge: TE 10' [phone]), [place] 8' [phone]), Manual 12' [phone]  Visit#: 7 of 12  Re-eval: 04/02/13 Assessment Diagnosis: Rt TKR Surgical Date: 02/09/13 Next MD Visit: Dr. Despina Hick - August Prior Therapy: HHPT  Authorization: BCBS Medicare  Authorization Time Period: G code on 5th visit  Authorization Visit#: 7 of 15   Subjective: Symptoms/Limitations Symptoms: Pt stated minimal pain today, feels [place] is helping.  Pain scale 3/10. Pain Assessment Currently in Pain?: Yes Pain Score: 3  Pain Location: Knee Pain Orientation: Right  Precautions/Restrictions  Precautions Precautions: Knee;Fall  Exercise/Treatments Stretches Active Hamstring Stretch: 3 reps;30 seconds;Limitations Active Hamstring Stretch Limitations: with rope Aerobic Stationary Bike: Nustep x 8' for activity tolerance and to reduce tightness   Modalities Modalities: Ultrasound Manual Therapy Manual Therapy: Joint mobilization Joint Mobilization: A/P joint mobs completed to right knee to increase ROM and decrease pain Myofascial Release: MFR to medial aspect of knee to reduce fascial restrictions and reduce pain Ultrasound Ultrasound Location: anterior knee and medial aspect Ultrasound Parameters: 1.0 w/cm2 3 MHz pulsed x 8 minutes with LE elevated Ultrasound Goals: Pain  Physical Therapy Assessment and Plan PT Assessment and Plan Clinical Impression Statement: Session focus on pain reduction.  Continued ultrasound to anterior and medial aspect of Rt knee and manual techniques to reduce fascial restrictions for pain control. Pt reported pain reduced to less than 1/10 following.  Added hamstring stretch to reduce tightness and pain posterior knee with relief stated following.  Pt encouraged to wait 2-3 hours following  session to ice knee for maximum benefits of [place]. PT Plan: Continue with current POC x 2 more sessions per MD    Goals    Problem List Patient Active Problem List   Diagnosis Date Noted  . Knee pain 03/03/2013  . Knee stiffness 03/03/2013  . Postoperative anemia due to acute blood loss 02/10/2013  . Postop Hypokalemia 11/07/2011  . Postop Acute blood loss anemia 11/07/2011  . Postop Hyponatremia 11/06/2011  . Hypertension 09/28/2011  . Osteoarthritis, knee 06/04/2011  . Obesity 06/04/2011    PT - End of Session Activity Tolerance: Patient tolerated treatment well General Behavior During Therapy: [facility] for tasks assessed/performed  GP    Juel Burrow 03/25/2013, 2:30 PM

## 2013-04-30 ENCOUNTER — Ambulatory Visit: Payer: 59 | Admitting: Family Medicine

## 2013-05-01 ENCOUNTER — Encounter: Payer: Self-pay | Admitting: Family Medicine

## 2013-05-01 ENCOUNTER — Ambulatory Visit (INDEPENDENT_AMBULATORY_CARE_PROVIDER_SITE_OTHER): Payer: Medicare Other | Admitting: Family Medicine

## 2013-05-01 VITALS — BP 136/74 | HR 85 | Temp 98.2°F | Wt 242.0 lb

## 2013-05-01 DIAGNOSIS — I10 Essential (primary) hypertension: Secondary | ICD-10-CM

## 2013-05-01 DIAGNOSIS — D649 Anemia, unspecified: Secondary | ICD-10-CM

## 2013-05-01 DIAGNOSIS — R5383 Other fatigue: Secondary | ICD-10-CM

## 2013-05-01 DIAGNOSIS — R5381 Other malaise: Secondary | ICD-10-CM

## 2013-05-01 LAB — CBC WITH DIFFERENTIAL/PLATELET
Basophils Absolute: 0 10*3/uL (ref 0.0–0.1)
Eosinophils Absolute: 0.3 10*3/uL (ref 0.0–0.7)
HCT: 36.1 % (ref 36.0–46.0)
Hemoglobin: 12.1 g/dL (ref 12.0–15.0)
Lymphocytes Relative: 23.8 % (ref 12.0–46.0)
Lymphs Abs: 1.9 10*3/uL (ref 0.7–4.0)
MCHC: 33.5 g/dL (ref 30.0–36.0)
Neutro Abs: 5.2 10*3/uL (ref 1.4–7.7)
Platelets: 319 10*3/uL (ref 150.0–400.0)
RDW: 15.5 % — ABNORMAL HIGH (ref 11.5–14.6)

## 2013-05-01 LAB — BASIC METABOLIC PANEL
BUN: 27 mg/dL — ABNORMAL HIGH (ref 6–23)
CO2: 30 mEq/L (ref 19–32)
Calcium: 9.8 mg/dL (ref 8.4–10.5)
GFR: 70.72 mL/min (ref >60.00)
Glucose, Bld: 84 mg/dL (ref 70–99)
Sodium: 139 mEq/L (ref 135–145)

## 2013-05-01 LAB — TSH: TSH: 1.35 u[IU]/mL (ref 0.35–5.50)

## 2013-05-01 NOTE — Progress Notes (Signed)
Subjective:    Patient ID: Carla Schmitt, female    DOB: 1945/09/07, 67 y.o.   MRN: [id number]  HPI Patient has history of obesity, osteoarthritis, hypertension She had recent total knee replacement. She has generally had good recovery but still having some knee soreness. About one week ago she noticed relatively acute worsening fatigue. She did have some postoperative anemia as expected with hemoglobin 10 range. She's not had dizziness or orthostasis. She is generally sleeping well. She is somewhat discouraged because of slowness of recovery from her surgery but overall mood is been stable.  Hypertension treated with losartan HCTZ amlodipine. Blood pressures been stable. She has had mild hypokalemia in the past. She's had good appetite. Denies any dizziness, chest pains, dyspnea, dysuria.  Past Medical History  Diagnosis Date  . Colon polyps   . Hypertension   . Varicose veins     both legs  . Irritable bowel   . Arthritis   . Headache(784.0)     tension, or allergy headaches  . Tinnitus     both ears most of the time  . History of kidney stones   . Stiffness of joint, shoulder region     left   Past Surgical History  Procedure Laterality Date  . Foot surgery  many yrs ago    both feet  . Abdominal hysterectomy  02-09-2010    TAH  . Total knee arthroplasty  11/05/2011    Procedure: TOTAL KNEE ARTHROPLASTY;  Surgeon: Loanne Drilling, MD;  Location: [facility];  Service: Orthopedics;  Laterality: Left;  . Total knee arthroplasty Right 02/09/2013    Procedure: RIGHT TOTAL KNEE ARTHROPLASTY;  Surgeon: Loanne Drilling, MD;  Location: [facility];  Service: Orthopedics;  Laterality: Right;    reports that she quit smoking about 21 years ago. Her smoking use included Cigarettes. She has a 18 pack-year smoking history. She has never used smokeless tobacco. She reports that she does not drink alcohol or use illicit drugs. family history includes Arthritis in her other; Heart disease in her other;  Stroke in her other. Allergies  Allergen Reactions  . Cefuroxime Axetil      ceftin=hives      Review of Systems  Constitutional: Positive for fatigue. Negative for fever, chills, appetite change and unexpected weight change.  Respiratory: Negative for cough and shortness of breath.   Cardiovascular: Negative for chest pain.  Gastrointestinal: Negative for nausea, vomiting and abdominal pain.  Genitourinary: Negative for dysuria.  Neurological: Negative for dizziness and headaches.  Hematological: Negative for adenopathy.       Objective:   Physical Exam  Constitutional: She is oriented to person, place, and time. She appears well-developed and well-nourished.  HENT:  Right Ear: External ear normal.  Left Ear: External ear normal.  Mouth/Throat: Oropharynx is clear and moist.  Neck: Neck supple. No thyromegaly present.  Cardiovascular: Normal rate and regular rhythm.   Pulmonary/Chest: Effort normal and breath sounds normal. No respiratory distress. She has no wheezes. She has no rales.  Musculoskeletal: She exhibits no edema.  Neurological: She is alert and oriented to person, place, and time. No cranial nerve deficit.  Psychiatric: She has a normal mood and affect. Her behavior is normal.          Assessment & Plan:  #1 fatigue. Likely multifactorial. Suspect somewhat related to lack of activity recently. She's had mild hypokalemia the past. Check basic metabolic panel, TSH, CBC. #2 mild postoperative anemia. Check CBC to make sure  she has come back to normal #3 hypertension well controlled. Continue current medications

## 2013-06-18 ENCOUNTER — Ambulatory Visit (INDEPENDENT_AMBULATORY_CARE_PROVIDER_SITE_OTHER): Payer: Medicare Other

## 2013-06-18 DIAGNOSIS — Z23 Encounter for immunization: Secondary | ICD-10-CM

## 2013-12-23 ENCOUNTER — Telehealth: Payer: Self-pay | Admitting: Family Medicine

## 2013-12-23 MED ORDER — AMLODIPINE BESYLATE 5 MG PO TABS: 5.0000 mg | ORAL_TABLET | Freq: Every day | ORAL | Status: DC

## 2013-12-23 NOTE — Telephone Encounter (Signed)
[facility] requesting new script for amLODipine (NORVASC) 5 MG tablet

## 2013-12-23 NOTE — Telephone Encounter (Signed)
Rx sent to pharmacy   

## 2014-02-23 ENCOUNTER — Telehealth: Payer: Self-pay | Admitting: Family Medicine

## 2014-02-23 NOTE — Telephone Encounter (Signed)
[facility], [place] is requesting 90 day re-fill on losartan-hydrochlorothiazide (HYZAAR) 100-25 MG per tablet

## 2014-02-24 MED ORDER — LOSARTAN POTASSIUM-HCTZ 100-25 MG PO TABS: 1.0000 | ORAL_TABLET | Freq: Every day | ORAL | Status: DC

## 2014-02-24 NOTE — Telephone Encounter (Signed)
Rx sent to pharmacy. Pt needs follow up visit.  

## 2014-04-29 ENCOUNTER — Ambulatory Visit (INDEPENDENT_AMBULATORY_CARE_PROVIDER_SITE_OTHER): Payer: Medicare Other | Admitting: Family Medicine

## 2014-04-29 ENCOUNTER — Encounter: Payer: Self-pay | Admitting: Family Medicine

## 2014-04-29 VITALS — BP 138/68 | HR 91 | Temp 98.0°F | Wt 251.0 lb

## 2014-04-29 DIAGNOSIS — Z23 Encounter for immunization: Secondary | ICD-10-CM

## 2014-04-29 DIAGNOSIS — I10 Essential (primary) hypertension: Secondary | ICD-10-CM

## 2014-04-29 LAB — BASIC METABOLIC PANEL
BUN: 20 mg/dL (ref 6–23)
CHLORIDE: 102 meq/L (ref 96–112)
CO2: 30 mEq/L (ref 19–32)
Calcium: 10.2 mg/dL (ref 8.4–10.5)
Creatinine, Ser: 0.8 mg/dL (ref 0.4–1.2)
GFR: 89.67 mL/min (ref >60.00)
Glucose, Bld: 100 mg/dL — ABNORMAL HIGH (ref 70–99)
POTASSIUM: 4.2 meq/L (ref 3.5–5.1)
SODIUM: 139 meq/L (ref 135–145)

## 2014-04-29 MED ORDER — LOSARTAN POTASSIUM-HCTZ 100-25 MG PO TABS: 1.0000 | ORAL_TABLET | Freq: Every day | ORAL | Status: DC

## 2014-04-29 MED ORDER — AMLODIPINE BESYLATE 5 MG PO TABS: 5.0000 mg | ORAL_TABLET | Freq: Every day | ORAL | Status: DC

## 2014-04-29 NOTE — Progress Notes (Signed)
   Subjective:    Patient ID: Carla Schmitt, female    DOB: February 04, 1946, 68 y.o.   MRN: [id number]  HPI Patient seen for followup hypertension. She has morbid obesity and is unfortunately gained during the past year. She takes losartan HCTZ and amlodipine for hypertension. She's had previous bilateral total knee replacements and is ambulating without difficulty. She has some mild peripheral edema which is relatively stable. No dyspnea. No falls. No history of pneumonia vaccine. Still needs flu vaccine. No history of type 2 diabetes or hyperlipidemia.  She does not do any consistent exercise. Compliant with medications. Home blood pressure readings are reviewed.  Past Medical History  Diagnosis Date  . Colon polyps   . Hypertension   . Varicose veins     both legs  . Irritable bowel   . Arthritis   . Headache(784.0)     tension, or allergy headaches  . Tinnitus     both ears most of the time  . History of kidney stones   . Stiffness of joint, shoulder region     left   Past Surgical History  Procedure Laterality Date  . Foot surgery  many yrs ago    both feet  . Abdominal hysterectomy  02-09-2010    TAH  . Total knee arthroplasty  11/05/2011    Procedure: TOTAL KNEE ARTHROPLASTY;  Surgeon: Gearlean Alf, MD;  Location: [facility];  Service: Orthopedics;  Laterality: Left;  . Total knee arthroplasty Right 02/09/2013    Procedure: RIGHT TOTAL KNEE ARTHROPLASTY;  Surgeon: Gearlean Alf, MD;  Location: [facility];  Service: Orthopedics;  Laterality: Right;    reports that she quit smoking about 22 years ago. Her smoking use included Cigarettes. She has a 18 pack-year smoking history. She has never used smokeless tobacco. She reports that she does not drink alcohol or use illicit drugs. family history includes Arthritis in her other; Heart disease in her other; Stroke in her other. Allergies  Allergen Reactions  . Cefuroxime Axetil      ceftin=hives      Review of Systems    Constitutional: Negative for fatigue.  Eyes: Negative for visual disturbance.  Respiratory: Negative for cough, chest tightness, shortness of breath and wheezing.   Cardiovascular: Negative for chest pain, palpitations and leg swelling.  Gastrointestinal: Negative for abdominal pain.  Endocrine: Negative for polydipsia and polyuria.  Neurological: Negative for dizziness, seizures, syncope, weakness, light-headedness and headaches.       Objective:   Physical Exam  Constitutional: She appears well-developed and well-nourished.  Neck: Neck supple. No thyromegaly present.  Cardiovascular: Normal rate and regular rhythm.  Exam reveals no gallop.   Pulmonary/Chest: Effort normal and breath sounds normal. No respiratory distress. She has no wheezes. She has no rales.  Musculoskeletal: She exhibits no edema.          Assessment & Plan:  #1 hypertension. Stable at goal. We strongly advocated weight loss. Refill medications for one year. Check basic metabolic panel #2 obesity. We discussed weight loss strategies. We recommended more consistent exercise. Reduce overall calorie intake. #3 health maintenance. Flu vaccine and Pneumovax given. She still sees gynecologist for Pap smears and has been getting mammograms through their office.

## 2014-04-29 NOTE — Addendum Note (Signed)
Addended by: Marcina Millard on: 04/29/2014 03:02 PM   Modules accepted: Orders

## 2014-04-29 NOTE — Patient Instructions (Signed)

## 2014-04-29 NOTE — Progress Notes (Signed)
Pre visit review using our clinic review tool, if applicable. No additional management support is needed unless otherwise documented below in the visit note. 

## 2015-02-08 ENCOUNTER — Telehealth: Payer: Self-pay

## 2015-02-08 DIAGNOSIS — Z1231 Encounter for screening mammogram for malignant neoplasm of breast: Secondary | ICD-10-CM

## 2015-02-08 NOTE — Telephone Encounter (Signed)
Spoke with pt. Ordered mammogram. 

## 2015-04-14 ENCOUNTER — Encounter: Payer: Self-pay | Admitting: Family Medicine

## 2015-04-14 ENCOUNTER — Ambulatory Visit (INDEPENDENT_AMBULATORY_CARE_PROVIDER_SITE_OTHER): Payer: Medicare Other | Admitting: Family Medicine

## 2015-04-14 VITALS — BP 134/70 | HR 78 | Temp 98.4°F | Ht 63.39 in | Wt 259.0 lb

## 2015-04-14 DIAGNOSIS — Z780 Asymptomatic menopausal state: Secondary | ICD-10-CM | POA: Diagnosis not present

## 2015-04-14 DIAGNOSIS — Z0000 Encounter for general adult medical examination without abnormal findings: Secondary | ICD-10-CM | POA: Diagnosis not present

## 2015-04-14 DIAGNOSIS — Z23 Encounter for immunization: Secondary | ICD-10-CM

## 2015-04-14 LAB — BASIC METABOLIC PANEL
BUN: 21 mg/dL (ref 6–23)
CALCIUM: 10.3 mg/dL (ref 8.4–10.5)
CHLORIDE: 102 meq/L (ref 96–112)
CO2: 31 meq/L (ref 19–32)
Creatinine, Ser: 0.82 mg/dL (ref 0.40–1.20)
GFR: 89.41 mL/min (ref >60.00)
Glucose, Bld: 109 mg/dL — ABNORMAL HIGH (ref 70–99)
POTASSIUM: 4.4 meq/L (ref 3.5–5.1)
SODIUM: 142 meq/L (ref 135–145)

## 2015-04-14 LAB — HEPATIC FUNCTION PANEL
ALT: 13 U/L (ref 0–35)
AST: 18 U/L (ref 0–37)
Albumin: 4.2 g/dL (ref 3.5–5.2)
Alkaline Phosphatase: 80 U/L (ref 39–117)
BILIRUBIN TOTAL: 0.4 mg/dL (ref 0.2–1.2)
Bilirubin, Direct: 0.1 mg/dL (ref 0.0–0.3)
TOTAL PROTEIN: 7.7 g/dL (ref 6.0–8.3)

## 2015-04-14 LAB — LIPID PANEL
Cholesterol: 171 mg/dL (ref 0–200)
HDL: 62.5 mg/dL (ref >39.00)
LDL Cholesterol: 78 mg/dL (ref 0–99)
NonHDL: 108.37
TRIGLYCERIDES: 153 mg/dL — AB (ref 0.0–149.0)
Total CHOL/HDL Ratio: 3
VLDL: 30.6 mg/dL (ref 0.0–40.0)

## 2015-04-14 LAB — CBC WITH DIFFERENTIAL/PLATELET
BASOS PCT: 0.5 % (ref 0.0–3.0)
Basophils Absolute: 0 10*3/uL (ref 0.0–0.1)
EOS PCT: 3.2 % (ref 0.0–5.0)
Eosinophils Absolute: 0.2 10*3/uL (ref 0.0–0.7)
HEMATOCRIT: 38.1 % (ref 36.0–46.0)
Hemoglobin: 12.5 g/dL (ref 12.0–15.0)
LYMPHS ABS: 1.8 10*3/uL (ref 0.7–4.0)
LYMPHS PCT: 25.5 % (ref 12.0–46.0)
MCHC: 32.7 g/dL (ref 30.0–36.0)
MCV: 85.2 fl (ref 78.0–100.0)
MONOS PCT: 6.2 % (ref 3.0–12.0)
Monocytes Absolute: 0.4 10*3/uL (ref 0.1–1.0)
NEUTROS ABS: 4.5 10*3/uL (ref 1.4–7.7)
Neutrophils Relative %: 64.6 % (ref 43.0–77.0)
Platelets: 301 10*3/uL (ref 150.0–400.0)
RBC: 4.48 Mil/uL (ref 3.87–5.11)
RDW: 15.5 % (ref 11.5–15.5)
WBC: 6.9 10*3/uL (ref 4.0–10.5)

## 2015-04-14 LAB — TSH: TSH: 1.87 u[IU]/mL (ref 0.35–4.50)

## 2015-04-14 MED ORDER — LOSARTAN POTASSIUM-HCTZ 100-25 MG PO TABS: 1.0000 | ORAL_TABLET | Freq: Every day | ORAL | Status: DC

## 2015-04-14 MED ORDER — AMLODIPINE BESYLATE 5 MG PO TABS: 5.0000 mg | ORAL_TABLET | Freq: Every day | ORAL | Status: DC

## 2015-04-14 NOTE — Addendum Note (Signed)
Addended by: Noe Gens E on: 04/14/2015 10:02 AM   Modules accepted: Orders

## 2015-04-14 NOTE — Patient Instructions (Signed)
Continue with yearly flu vaccine Try to lose some weight Set up repeat mammogram.

## 2015-04-14 NOTE — Progress Notes (Signed)
Subjective:    Patient ID: Carla Schmitt, female    DOB: 03-Feb-1946, 69 y.o.   MRN: [id number]  HPI Patient here for complete physical. Her chronic problems include history of morbid obesity, osteoarthritis, hypertension. She needs Pneumovax. Had Prevnar 13 year ago. Last colonoscopy was 2005. She is undecided about getting any further at this point. She is overdue for repeat mammogram. She's had prior hysterectomy for benign disease. No history of bone density scan.  Past Medical History  Diagnosis Date  . Colon polyps   . Hypertension   . Varicose veins     both legs  . Irritable bowel   . Arthritis   . Headache(784.0)     tension, or allergy headaches  . Tinnitus     both ears most of the time  . History of kidney stones   . Stiffness of joint, shoulder region     left   Past Surgical History  Procedure Laterality Date  . Foot surgery  many yrs ago    both feet  . Abdominal hysterectomy  02-09-2010    TAH  . Total knee arthroplasty  11/05/2011    Procedure: TOTAL KNEE ARTHROPLASTY;  Surgeon: Gearlean Alf, MD;  Location: [facility];  Service: Orthopedics;  Laterality: Left;  . Total knee arthroplasty Right 02/09/2013    Procedure: RIGHT TOTAL KNEE ARTHROPLASTY;  Surgeon: Gearlean Alf, MD;  Location: [facility];  Service: Orthopedics;  Laterality: Right;    reports that she quit smoking about 23 years ago. Her smoking use included Cigarettes. She has a 18 pack-year smoking history. She has never used smokeless tobacco. She reports that she does not drink alcohol or use illicit drugs. family history includes Arthritis in her other; Heart disease in her other; Stroke in her other. Allergies  Allergen Reactions  . Cefuroxime Axetil      ceftin=hives      Review of Systems  Constitutional: Negative for fever, activity change, appetite change, fatigue and unexpected weight change.  HENT: Negative for ear pain, hearing loss, sore throat and trouble swallowing.   Eyes: Negative  for visual disturbance.  Respiratory: Negative for cough and shortness of breath.   Cardiovascular: Negative for chest pain and palpitations.  Gastrointestinal: Negative for abdominal pain, diarrhea, constipation and blood in stool.  Genitourinary: Negative for dysuria and hematuria.  Musculoskeletal: Positive for arthralgias. Negative for myalgias and back pain.  Skin: Negative for rash.  Neurological: Negative for dizziness, syncope and headaches.  Hematological: Negative for adenopathy.  Psychiatric/Behavioral: Negative for confusion and dysphoric mood.       Objective:   Physical Exam  Constitutional: She is oriented to person, place, and time. She appears well-developed and well-nourished.  HENT:  Head: Normocephalic and atraumatic.  Eyes: EOM are normal. Pupils are equal, round, and reactive to light.  Neck: Normal range of motion. Neck supple. No thyromegaly present.  Cardiovascular: Normal rate, regular rhythm and normal heart sounds.   No murmur heard. Pulmonary/Chest: Breath sounds normal. No respiratory distress. She has no wheezes. She has no rales.  Abdominal: Soft. Bowel sounds are normal. She exhibits no distension and no mass. There is no tenderness. There is no rebound and no guarding.  Musculoskeletal: Normal range of motion. She exhibits no edema.  Lymphadenopathy:    She has no cervical adenopathy.  Neurological: She is alert and oriented to person, place, and time. She displays normal reflexes. No cranial nerve deficit.  Skin: No rash noted.  Psychiatric: She  has a normal mood and affect. Her behavior is normal. Judgment and thought content normal.          Assessment & Plan:  Complete physical. Continue yearly flu vaccine. Pneumovax given. Information on cologuard which she will consider versus colonoscopy. She will check on insurance coverage. Handout given. Set up repeat mammogram. Will set up DEXA scan. Obtain screening lab work. Lose some weight. Refill  blood pressure medications for one year

## 2015-04-21 ENCOUNTER — Encounter: Admission: RE | Payer: Self-pay | Source: Ambulatory Visit

## 2015-04-21 ENCOUNTER — Ambulatory Visit: Admission: RE | Admit: 2015-04-21 | Payer: TRICARE For Life (TFL) | Source: Ambulatory Visit | Admitting: Otolaryngology

## 2015-04-21 SURGERY — SEPTOPLASTY, NOSE, WITH NASAL TURBINATE REDUCTION
Anesthesia: General | Laterality: Right

## 2015-05-06 NOTE — Patient Instructions (Signed)
Your procedure is scheduled on:   Report to [facility] at       AM.  Call this number if you have problems the morning of surgery: [phone]   Do not eat food or drink liquids :After Midnight.      Take these medicines the morning of surgery with A SIP OF WATER: amlodipine, beconase   Do not wear jewelry, make-up or nail polish.  Do not wear lotions, powders, or perfumes. You may wear deodorant.  Do not shave 48 hours prior to surgery.  Do not bring valuables to the hospital.  Contacts, dentures or bridgework may not be worn into surgery.  Leave suitcase in the car. After surgery it may be brought to your room.  For patients admitted to the hospital, checkout time is 11:00 AM the day of discharge.   Patients discharged the day of surgery will not be allowed to drive home.  :     Please read over the following fact sheets that you were given: Coughing and Deep Breathing, Surgical Site Infection Prevention, Anesthesia Post-op Instructions and Care and Recovery After Surgery    Cataract A cataract is a clouding of the lens of the eye. When a lens becomes cloudy, vision is reduced based on the degree and nature of the clouding. Many cataracts reduce vision to some degree. Some cataracts make people more near-sighted as they develop. Other cataracts increase glare. Cataracts that are ignored and become worse can sometimes look white. The white color can be seen through the pupil. CAUSES   Aging. However, cataracts may occur at any age, even in newborns.   Certain drugs.   Trauma to the eye.   Certain diseases such as diabetes.   Specific eye diseases such as chronic inflammation inside the eye or a sudden attack of a rare form of glaucoma.   Inherited or acquired medical problems.  SYMPTOMS   Gradual, progressive drop in vision in the affected eye.   Severe, rapid visual loss. This most often happens when trauma is the cause.  DIAGNOSIS  To detect a cataract, an eye doctor  examines the lens. Cataracts are best diagnosed with an exam of the eyes with the pupils enlarged (dilated) by drops.  TREATMENT  For an early cataract, vision may improve by using different eyeglasses or stronger lighting. If that does not help your vision, surgery is the only effective treatment. A cataract needs to be surgically removed when vision loss interferes with your everyday activities, such as driving, reading, or watching TV. A cataract may also have to be removed if it prevents examination or treatment of another eye problem. Surgery removes the cloudy lens and usually replaces it with a substitute lens (intraocular lens, IOL).  At a time when both you and your doctor agree, the cataract will be surgically removed. If you have cataracts in both eyes, only one is usually removed at a time. This allows the operated eye to heal and be out of danger from any possible problems after surgery (such as infection or poor wound healing). In rare cases, a cataract may be doing damage to your eye. In these cases, your caregiver may advise surgical removal right away. The vast majority of people who have cataract surgery have better vision afterward. HOME CARE INSTRUCTIONS  If you are not planning surgery, you may be asked to do the following:  Use different eyeglasses.   Use stronger or brighter lighting.   Ask your eye doctor about reducing  your medicine dose or changing medicines if it is thought that a medicine caused your cataract. Changing medicines does not make the cataract go away on its own.   Become familiar with your surroundings. Poor vision can lead to injury. Avoid bumping into things on the affected side. You are at a higher risk for tripping or falling.   Exercise extreme care when driving or operating machinery.   Wear sunglasses if you are sensitive to bright light or experiencing problems with glare.  SEEK IMMEDIATE MEDICAL CARE IF:   You have a worsening or sudden vision  loss.   You notice redness, swelling, or increasing pain in the eye.   You have a fever.  Document Released: 08/20/2005 Document Revised: 08/09/2011 Document Reviewed: 04/13/2011 [facility] Patient Information 2012 [place].PATIENT INSTRUCTIONS POST-ANESTHESIA  IMMEDIATELY FOLLOWING SURGERY:  Do not drive or operate machinery for the first twenty four hours after surgery.  Do not make any important decisions for twenty four hours after surgery or while taking narcotic pain medications or sedatives.  If you develop intractable nausea and vomiting or a severe headache please notify your doctor immediately.  FOLLOW-UP:  Please make an appointment with your surgeon as instructed. You do not need to follow up with anesthesia unless specifically instructed to do so.  WOUND CARE INSTRUCTIONS (if applicable):  Keep a dry clean dressing on the anesthesia/puncture wound site if there is drainage.  Once the wound has quit draining you may leave it open to air.  Generally you should leave the bandage intact for twenty four hours unless there is drainage.  If the epidural site drains for more than 36-48 hours please call the anesthesia department.  QUESTIONS?:  Please feel free to call your physician or the hospital operator if you have any questions, and they will be happy to assist you.

## 2015-05-10 ENCOUNTER — Encounter (HOSPITAL_COMMUNITY): Payer: Self-pay

## 2015-05-10 ENCOUNTER — Other Ambulatory Visit: Payer: Self-pay

## 2015-05-10 ENCOUNTER — Encounter (HOSPITAL_COMMUNITY)
Admission: RE | Admit: 2015-05-10 | Discharge: 2015-05-10 | Disposition: A | Discharge status: Home / Self Care | Payer: Medicare Other | Source: Ambulatory Visit | Attending: Ophthalmology | Admitting: Ophthalmology

## 2015-05-10 DIAGNOSIS — Z01818 Encounter for other preprocedural examination: Secondary | ICD-10-CM | POA: Diagnosis present

## 2015-05-10 DIAGNOSIS — H2511 Age-related nuclear cataract, right eye: Secondary | ICD-10-CM | POA: Diagnosis not present

## 2015-05-10 NOTE — Pre-Procedure Instructions (Signed)
Patient given information to sign up for my chart at home. 

## 2015-05-16 MED ORDER — PHENYLEPHRINE HCL 2.5 % OP SOLN
OPHTHALMIC | Status: AC
  Filled 2015-05-16: qty 15

## 2015-05-16 MED ORDER — CYCLOPENTOLATE-PHENYLEPHRINE OP SOLN OPTIME - NO CHARGE
OPHTHALMIC | Status: AC
  Filled 2015-05-16: qty 2

## 2015-05-16 MED ORDER — TETRACAINE HCL 0.5 % OP SOLN
OPHTHALMIC | Status: AC
  Filled 2015-05-16: qty 2

## 2015-05-16 MED ORDER — KETOROLAC TROMETHAMINE 0.5 % OP SOLN
OPHTHALMIC | Status: AC
  Filled 2015-05-16: qty 5

## 2015-05-17 ENCOUNTER — Ambulatory Visit (HOSPITAL_COMMUNITY)
Admission: RE | Admit: 2015-05-17 | Discharge: 2015-05-17 | Disposition: A | Discharge status: Home / Self Care | Payer: Medicare Other | Source: Ambulatory Visit | Attending: Ophthalmology | Admitting: Ophthalmology

## 2015-05-17 ENCOUNTER — Encounter (HOSPITAL_COMMUNITY)
Admission: RE | Disposition: A | Discharge status: Home / Self Care | Payer: Self-pay | Source: Ambulatory Visit | Attending: Ophthalmology

## 2015-05-17 ENCOUNTER — Ambulatory Visit (HOSPITAL_COMMUNITY): Payer: Medicare Other | Admitting: Anesthesiology

## 2015-05-17 ENCOUNTER — Encounter (HOSPITAL_COMMUNITY): Payer: Self-pay

## 2015-05-17 DIAGNOSIS — Z7982 Long term (current) use of aspirin: Secondary | ICD-10-CM | POA: Diagnosis not present

## 2015-05-17 DIAGNOSIS — H2511 Age-related nuclear cataract, right eye: Secondary | ICD-10-CM | POA: Diagnosis present

## 2015-05-17 DIAGNOSIS — Z87891 Personal history of nicotine dependence: Secondary | ICD-10-CM | POA: Insufficient documentation

## 2015-05-17 DIAGNOSIS — I10 Essential (primary) hypertension: Secondary | ICD-10-CM | POA: Insufficient documentation

## 2015-05-17 HISTORY — PX: CATARACT EXTRACTION W/PHACO: SHX586

## 2015-05-17 SURGERY — PHACOEMULSIFICATION, CATARACT, WITH IOL INSERTION
Anesthesia: Monitor Anesthesia Care | Site: Eye | Laterality: Right

## 2015-05-17 MED ORDER — LACTATED RINGERS IV SOLN
INTRAVENOUS | Status: DC
  Administered 2015-05-17: 07:00:00 via INTRAVENOUS

## 2015-05-17 MED ORDER — MIDAZOLAM HCL 2 MG/2ML IJ SOLN
INTRAMUSCULAR | Status: AC
  Filled 2015-05-17: qty 2

## 2015-05-17 MED ORDER — PHENYLEPHRINE HCL 2.5 % OP SOLN
1.0000 [drp] | OPHTHALMIC | Status: AC
  Administered 2015-05-17 (×3): 1 [drp] via OPHTHALMIC

## 2015-05-17 MED ORDER — EPINEPHRINE HCL 1 MG/ML IJ SOLN
INTRAMUSCULAR | Status: AC
  Filled 2015-05-17: qty 1

## 2015-05-17 MED ORDER — LIDOCAINE HCL (PF) 1 % IJ SOLN
INTRAMUSCULAR | Status: AC
  Filled 2015-05-17: qty 2

## 2015-05-17 MED ORDER — MIDAZOLAM HCL 2 MG/2ML IJ SOLN
1.0000 mg | INTRAMUSCULAR | Status: DC | PRN
  Administered 2015-05-17 (×2): 2 mg via INTRAVENOUS
  Filled 2015-05-17: qty 2

## 2015-05-17 MED ORDER — BSS IO SOLN
INTRAOCULAR | Status: DC | PRN
  Administered 2015-05-17: 15 mL

## 2015-05-17 MED ORDER — FENTANYL CITRATE (PF) 100 MCG/2ML IJ SOLN
INTRAMUSCULAR | Status: AC
  Filled 2015-05-17: qty 2

## 2015-05-17 MED ORDER — CYCLOPENTOLATE-PHENYLEPHRINE 0.2-1 % OP SOLN
1.0000 [drp] | OPHTHALMIC | Status: AC
  Administered 2015-05-17 (×3): 1 [drp] via OPHTHALMIC

## 2015-05-17 MED ORDER — KETOROLAC TROMETHAMINE 0.5 % OP SOLN
1.0000 [drp] | OPHTHALMIC | Status: AC
  Administered 2015-05-17 (×3): 1 [drp] via OPHTHALMIC

## 2015-05-17 MED ORDER — EPINEPHRINE HCL 1 MG/ML IJ SOLN
INTRAOCULAR | Status: DC | PRN
  Administered 2015-05-17: 500 mL

## 2015-05-17 MED ORDER — TETRACAINE 0.5 % OP SOLN OPTIME - NO CHARGE
OPHTHALMIC | Status: DC | PRN
  Administered 2015-05-17: 1 [drp] via OPHTHALMIC

## 2015-05-17 MED ORDER — PROVISC 10 MG/ML IO SOLN
INTRAOCULAR | Status: DC | PRN
  Administered 2015-05-17: 0.85 mL via INTRAOCULAR

## 2015-05-17 MED ORDER — TETRACAINE HCL 0.5 % OP SOLN
1.0000 [drp] | OPHTHALMIC | Status: AC
  Administered 2015-05-17 (×3): 1 [drp] via OPHTHALMIC

## 2015-05-17 MED ORDER — FENTANYL CITRATE (PF) 100 MCG/2ML IJ SOLN
25.0000 ug | INTRAMUSCULAR | Status: AC
  Administered 2015-05-17 (×2): 25 ug via INTRAVENOUS

## 2015-05-17 SURGICAL SUPPLY — 9 items
CLOTH BEACON ORANGE TIMEOUT ST (SAFETY) ×1 IMPLANT
EYE SHIELD UNIVERSAL CLEAR (GAUZE/BANDAGES/DRESSINGS) ×1 IMPLANT
GLOVE BIO SURGEON STRL SZ 6.5 (GLOVE) ×1 IMPLANT
GLOVE EXAM NITRILE MD LF STRL (GLOVE) ×1 IMPLANT
LENS ALC ACRYL/TECN (Ophthalmic Related) ×2 IMPLANT
PAD ARMBOARD 7.5X6 YLW CONV (MISCELLANEOUS) ×1 IMPLANT
TAPE SURG TRANSPORE 1 IN (GAUZE/BANDAGES/DRESSINGS) IMPLANT
TAPE SURGICAL TRANSPORE 1 IN (GAUZE/BANDAGES/DRESSINGS) ×1
WATER STERILE IRR 250ML POUR (IV SOLUTION) ×1 IMPLANT

## 2015-05-17 NOTE — Addendum Note (Signed)
Addendum  created 05/17/15 0815 by Michele Rockers, CRNA   Modules edited: Anesthesia Blocks and Procedures, Clinical Notes   Clinical Notes:  File: [id number]

## 2015-05-17 NOTE — Transfer of Care (Signed)
Immediate Anesthesia Transfer of Care Note  Patient: Carla Schmitt  Procedure(s) Performed: Procedure(s) with comments: CATARACT EXTRACTION PHACO AND INTRAOCULAR LENS PLACEMENT (IOC) (Right) - CDE:6.12  Patient Location: PACU  Anesthesia Type:MAC  Level of Consciousness: awake, alert , oriented, patient cooperative and responds to stimulation  Airway & Oxygen Therapy: Patient Spontanous Breathing  Post-op Assessment: Report given to RN, Post -op Vital signs reviewed and stable and Patient moving all extremities X 4  Post vital signs: Reviewed and stable  Last Vitals:  Filed Vitals:   05/17/15 0720  BP: 130/65  Pulse:   Temp:   Resp: 27    Complications: No apparent anesthesia complications

## 2015-05-17 NOTE — Anesthesia Preprocedure Evaluation (Signed)
Anesthesia Evaluation  Patient identified by MRN, date of birth, ID band Patient awake    Reviewed: Allergy & Precautions, H&P , NPO status , Patient's Chart, lab work & pertinent test results  Airway Mallampati: II  TM Distance: >3 FB Neck ROM: Full    Dental no notable dental hx.    Pulmonary neg pulmonary ROS, former smoker,    Pulmonary exam normal breath sounds clear to auscultation       Cardiovascular hypertension, Pt. on medications negative cardio ROS Normal cardiovascular exam Rhythm:Regular Rate:Normal     Neuro/Psych  Headaches, negative neurological ROS  negative psych ROS   GI/Hepatic negative GI ROS, Neg liver ROS,   Endo/Other  negative endocrine ROS  Renal/GU negative Renal ROS  negative genitourinary   Musculoskeletal negative musculoskeletal ROS (+)   Abdominal   Peds negative pediatric ROS (+)  Hematology negative hematology ROS (+)   Anesthesia Other Findings   Reproductive/Obstetrics negative OB ROS                             Anesthesia Physical Anesthesia Plan  ASA: II  Anesthesia Plan: MAC   Post-op Pain Management:    Induction: Intravenous  Airway Management Planned: Nasal Cannula  Additional Equipment:   Intra-op Plan:   Post-operative Plan:   Informed Consent: I have reviewed the patients History and Physical, chart, labs and discussed the procedure including the risks, benefits and alternatives for the proposed anesthesia with the patient or authorized representative who has indicated his/her understanding and acceptance.     Plan Discussed with:   Anesthesia Plan Comments:         Anesthesia Quick Evaluation

## 2015-05-17 NOTE — Anesthesia Postprocedure Evaluation (Signed)
  Anesthesia Post-op Note  Patient: Carla Schmitt  Procedure(s) Performed: Procedure(s) with comments: CATARACT EXTRACTION PHACO AND INTRAOCULAR LENS PLACEMENT (IOC) (Right) - CDE:6.12  Patient Location: PACU  Anesthesia Type:MAC  Level of Consciousness: awake, alert , oriented and responds to stimulation  Airway and Oxygen Therapy: Patient Spontanous Breathing  Post-op Pain: none  Post-op Assessment: Post-op Vital signs reviewed, Patient's Cardiovascular Status Stable, Respiratory Function Stable, Patent Airway, No signs of Nausea or vomiting and Adequate PO intake              Post-op Vital Signs: Reviewed and stable  Last Vitals:  Filed Vitals:   05/17/15 0720  BP: 130/65  Pulse:   Temp:   Resp: 27    Complications: No apparent anesthesia complications

## 2015-05-17 NOTE — Anesthesia Procedure Notes (Signed)
Procedure Name: MAC Date/Time: 05/17/2015 7:28 AM Performed by: Michele Rockers Pre-anesthesia Checklist: Patient identified, Emergency Drugs available, Suction available, Timeout performed and Patient being monitored Patient Re-evaluated:Patient Re-evaluated prior to inductionOxygen Delivery Method: Nasal Cannula

## 2015-05-17 NOTE — Op Note (Signed)
Patient brought to the operating room and prepped and draped in the usual manner. Lid speculum inserted in right eye. Stab incision made at the twelve o'clock position. Provisc instilled in the anterior chamber. A 2.4 mm. Stab incision was made temporally. An anterior capsulotomy was done with a bent 25 gauge needle. The nucleus was hydrodissected. The Phaco tip was inserted in the anterior chamber and the nucleus was emulsified. CDE was 6.12. The cortical material was then removed with the I and A tip. Posterior capsule was the polished. The anterior chamber was deepened with Provisc. A 21.0 Diopter Alcon SN60WF IOL was then inserted in the capsular bag. Provisc was then removed with the I and A tip. The wound was then hydrated. Patient sent to the Recovery Room in good condition with follow up in my office.  Preoperative Diagnosis: Cortical and Nuclear Cataract OD Postoperative Diagnosis: Same  Procedure name: Kelman Phacoemulsification OS with IOL

## 2015-05-17 NOTE — Discharge Instructions (Signed)
Camri P Ryer  05/17/2015           [facility] Instructions [place] [address]      1. Avoid closing eyes tightly. One often closes the eye tightly when laughing, talking, sneezing, coughing or if they feel irritated. At these times, you should be careful not to close your eyes tightly.  2. Instill eye drops as instructed. To instill drops in your eye, open it, look up and have someone gently pull the lower lid down and instill a couple of drops inside the lower lid.  3. Do not touch upper lid.  4. Take Advil or Tylenol for pain.  5. You may use either eye for near work, such as reading or sewing and you may watch television.  6. You may have your hair done at the beauty parlor at any time.  7. Wear dark glasses with or without your own glasses if you are in bright light.  8. Call our office at [phone] or [phone] if you have sharp pain in your eye or unusual symptoms.  9. Do not be concerned because vision in the operative eye is not good. It will not be good, no matter how successful the operation, until you get a special lens for it. Your old glasses will not be suited to the new eye that was operated on and you will not be ready for a new lens for about a month.  10. Follow up at the [facility] office.    I have received a copy of the above instructions and will follow them.      Follow up with Dr. Gershon Crane today between 2-3 PM

## 2015-05-17 NOTE — H&P (Signed)
The patient was re examined and there is no change in the patients condition since the original H and P. 

## 2015-05-18 ENCOUNTER — Encounter (HOSPITAL_COMMUNITY): Payer: Self-pay | Admitting: Ophthalmology

## 2015-05-24 ENCOUNTER — Encounter (HOSPITAL_COMMUNITY): Payer: Medicare Other | Attending: Ophthalmology

## 2015-05-30 MED ORDER — PHENYLEPHRINE HCL 2.5 % OP SOLN
OPHTHALMIC | Status: AC
  Filled 2015-05-30: qty 15

## 2015-05-30 MED ORDER — CYCLOPENTOLATE-PHENYLEPHRINE OP SOLN OPTIME - NO CHARGE
OPHTHALMIC | Status: AC
  Filled 2015-05-30: qty 2

## 2015-05-30 MED ORDER — KETOROLAC TROMETHAMINE 0.5 % OP SOLN
OPHTHALMIC | Status: AC
  Filled 2015-05-30: qty 5

## 2015-05-30 MED ORDER — TETRACAINE HCL 0.5 % OP SOLN
OPHTHALMIC | Status: AC
  Filled 2015-05-30: qty 2

## 2015-05-31 ENCOUNTER — Ambulatory Visit (HOSPITAL_COMMUNITY): Payer: Medicare Other | Admitting: Anesthesiology

## 2015-05-31 ENCOUNTER — Encounter (HOSPITAL_COMMUNITY): Payer: Self-pay | Admitting: *Deleted

## 2015-05-31 ENCOUNTER — Ambulatory Visit (HOSPITAL_COMMUNITY)
Admission: RE | Admit: 2015-05-31 | Discharge: 2015-05-31 | Disposition: A | Discharge status: Home / Self Care | Payer: Medicare Other | Source: Ambulatory Visit | Attending: Ophthalmology | Admitting: Ophthalmology

## 2015-05-31 ENCOUNTER — Encounter (HOSPITAL_COMMUNITY)
Admission: RE | Disposition: A | Discharge status: Home / Self Care | Payer: Self-pay | Source: Ambulatory Visit | Attending: Ophthalmology

## 2015-05-31 DIAGNOSIS — H2512 Age-related nuclear cataract, left eye: Secondary | ICD-10-CM | POA: Diagnosis not present

## 2015-05-31 DIAGNOSIS — I10 Essential (primary) hypertension: Secondary | ICD-10-CM | POA: Diagnosis not present

## 2015-05-31 DIAGNOSIS — M1990 Unspecified osteoarthritis, unspecified site: Secondary | ICD-10-CM | POA: Diagnosis not present

## 2015-05-31 DIAGNOSIS — Z79899 Other long term (current) drug therapy: Secondary | ICD-10-CM | POA: Diagnosis not present

## 2015-05-31 DIAGNOSIS — H25012 Cortical age-related cataract, left eye: Secondary | ICD-10-CM | POA: Insufficient documentation

## 2015-05-31 DIAGNOSIS — E780 Pure hypercholesterolemia: Secondary | ICD-10-CM | POA: Diagnosis not present

## 2015-05-31 DIAGNOSIS — Z7982 Long term (current) use of aspirin: Secondary | ICD-10-CM | POA: Diagnosis not present

## 2015-05-31 HISTORY — PX: CATARACT EXTRACTION W/PHACO: SHX586

## 2015-05-31 SURGERY — PHACOEMULSIFICATION, CATARACT, WITH IOL INSERTION
Anesthesia: Monitor Anesthesia Care | Site: Eye | Laterality: Left

## 2015-05-31 MED ORDER — TETRACAINE HCL 0.5 % OP SOLN
1.0000 [drp] | OPHTHALMIC | Status: AC
  Administered 2015-05-31 (×3): 1 [drp] via OPHTHALMIC

## 2015-05-31 MED ORDER — EPINEPHRINE HCL 1 MG/ML IJ SOLN
INTRAOCULAR | Status: DC | PRN
  Administered 2015-05-31: 500 mL

## 2015-05-31 MED ORDER — EPINEPHRINE HCL 1 MG/ML IJ SOLN
INTRAMUSCULAR | Status: AC
  Filled 2015-05-31: qty 1

## 2015-05-31 MED ORDER — LACTATED RINGERS IV SOLN
INTRAVENOUS | Status: DC
  Administered 2015-05-31: 1000 mL via INTRAVENOUS

## 2015-05-31 MED ORDER — CYCLOPENTOLATE-PHENYLEPHRINE 0.2-1 % OP SOLN
1.0000 [drp] | OPHTHALMIC | Status: AC
  Administered 2015-05-31 (×3): 1 [drp] via OPHTHALMIC

## 2015-05-31 MED ORDER — MIDAZOLAM HCL 2 MG/2ML IJ SOLN
1.0000 mg | INTRAMUSCULAR | Status: DC | PRN
  Administered 2015-05-31 (×2): 2 mg via INTRAVENOUS

## 2015-05-31 MED ORDER — FENTANYL CITRATE (PF) 100 MCG/2ML IJ SOLN
INTRAMUSCULAR | Status: AC
  Filled 2015-05-31: qty 2

## 2015-05-31 MED ORDER — PROVISC 10 MG/ML IO SOLN
INTRAOCULAR | Status: DC | PRN
  Administered 2015-05-31: 0.85 mL via INTRAOCULAR

## 2015-05-31 MED ORDER — KETOROLAC TROMETHAMINE 0.5 % OP SOLN
1.0000 [drp] | OPHTHALMIC | Status: AC
  Administered 2015-05-31 (×3): 1 [drp] via OPHTHALMIC

## 2015-05-31 MED ORDER — TETRACAINE 0.5 % OP SOLN OPTIME - NO CHARGE
OPHTHALMIC | Status: DC | PRN
  Administered 2015-05-31: 2 [drp] via OPHTHALMIC

## 2015-05-31 MED ORDER — MIDAZOLAM HCL 2 MG/2ML IJ SOLN
INTRAMUSCULAR | Status: AC
  Filled 2015-05-31: qty 2

## 2015-05-31 MED ORDER — BSS IO SOLN
INTRAOCULAR | Status: DC | PRN
  Administered 2015-05-31: 15 mL

## 2015-05-31 MED ORDER — PHENYLEPHRINE HCL 2.5 % OP SOLN
1.0000 [drp] | OPHTHALMIC | Status: AC
  Administered 2015-05-31 (×3): 1 [drp] via OPHTHALMIC

## 2015-05-31 MED ORDER — FENTANYL CITRATE (PF) 100 MCG/2ML IJ SOLN
25.0000 ug | INTRAMUSCULAR | Status: AC
  Administered 2015-05-31 (×2): 25 ug via INTRAVENOUS

## 2015-05-31 MED ORDER — LIDOCAINE HCL (PF) 1 % IJ SOLN
INTRAMUSCULAR | Status: AC
  Filled 2015-05-31: qty 2

## 2015-05-31 SURGICAL SUPPLY — 9 items
CLOTH BEACON ORANGE TIMEOUT ST (SAFETY) ×1 IMPLANT
EYE SHIELD UNIVERSAL CLEAR (GAUZE/BANDAGES/DRESSINGS) ×1 IMPLANT
GLOVE BIO SURGEON STRL SZ 6.5 (GLOVE) ×1 IMPLANT
GLOVE EXAM NITRILE MD LF STRL (GLOVE) ×1 IMPLANT
LENS ALC ACRYL/TECN (Ophthalmic Related) ×2 IMPLANT
PAD ARMBOARD 7.5X6 YLW CONV (MISCELLANEOUS) ×1 IMPLANT
TAPE SURG TRANSPORE 1 IN (GAUZE/BANDAGES/DRESSINGS) IMPLANT
TAPE SURGICAL TRANSPORE 1 IN (GAUZE/BANDAGES/DRESSINGS) ×1
WATER STERILE IRR 250ML POUR (IV SOLUTION) ×1 IMPLANT

## 2015-05-31 NOTE — Anesthesia Preprocedure Evaluation (Signed)
Anesthesia Evaluation  Patient identified by MRN, date of birth, ID band Patient awake    Reviewed: Allergy & Precautions, H&P , NPO status , Patient's Chart, lab work & pertinent test results  Airway Mallampati: II  TM Distance: >3 FB Neck ROM: Full    Dental no notable dental hx.    Pulmonary neg pulmonary ROS, former smoker,    Pulmonary exam normal breath sounds clear to auscultation       Cardiovascular hypertension, Pt. on medications negative cardio ROS Normal cardiovascular exam Rhythm:Regular Rate:Normal     Neuro/Psych  Headaches, negative neurological ROS  negative psych ROS   GI/Hepatic negative GI ROS, Neg liver ROS,   Endo/Other  negative endocrine ROS  Renal/GU negative Renal ROS  negative genitourinary   Musculoskeletal negative musculoskeletal ROS (+)   Abdominal   Peds negative pediatric ROS (+)  Hematology negative hematology ROS (+)   Anesthesia Other Findings   Reproductive/Obstetrics negative OB ROS                             Anesthesia Physical Anesthesia Plan  ASA: II  Anesthesia Plan: MAC   Post-op Pain Management:    Induction: Intravenous  Airway Management Planned: Nasal Cannula  Additional Equipment:   Intra-op Plan:   Post-operative Plan:   Informed Consent: I have reviewed the patients History and Physical, chart, labs and discussed the procedure including the risks, benefits and alternatives for the proposed anesthesia with the patient or authorized representative who has indicated his/her understanding and acceptance.     Plan Discussed with:   Anesthesia Plan Comments:         Anesthesia Quick Evaluation

## 2015-05-31 NOTE — Op Note (Signed)
Patient brought to the operating room and prepped and draped in the usual manner.  Lid speculum inserted in left eye.  Stab incision made at the twelve o'clock position.  Provisc instilled in the anterior chamber.   A 2.4 mm. Stab incision was made temporally.  An anterior capsulotomy was done with a bent 25 gauge needle.  The nucleus was hydrodissected.  The Phaco tip was inserted in the anterior chamber and the nucleus was emulsified.  CDE was 6.27.  The cortical material was then removed with the I and A tip.  Posterior capsule was the polished.  The anterior chamber was deepened with Provisc.  A 21.0 Diopter Alcon SN60WF IOL was then inserted in the capsular bag.  Provisc was then removed with the I and A tip.  The wound was then hydrated.  Patient sent to the Recovery Room in good condition with follow up in my office.  Preoperative Diagnosis: Cortical and Nuclear Cataract OS Postoperative Diagnosis:  Same Procedure name: Kelman Phacoemulsification OS with IOL

## 2015-05-31 NOTE — H&P (Signed)
The patient was re examined and there is no change in the patients condition since the original H and P. 

## 2015-05-31 NOTE — Discharge Instructions (Signed)
Era P Erker  05/31/2015           [facility] Instructions [place] [address]      1. Avoid closing eyes tightly. One often closes the eye tightly when laughing, talking, sneezing, coughing or if they feel irritated. At these times, you should be careful not to close your eyes tightly.  2. Instill eye drops as instructed. To instill drops in your eye, open it, look up and have someone gently pull the lower lid down and instill a couple of drops inside the lower lid.  3. Do not touch upper lid.  4. Take Advil or Tylenol for pain.  5. You may use either eye for near work, such as reading or sewing and you may watch television.  6. You may have your hair done at the beauty parlor at any time.  7. Wear dark glasses with or without your own glasses if you are in bright light.  8. Call our office at [phone] or [phone] if you have sharp pain in your eye or unusual symptoms.  9. Do not be concerned because vision in the operative eye is not good. It will not be good, no matter how successful the operation, until you get a special lens for it. Your old glasses will not be suited to the new eye that was operated on and you will not be ready for a new lens for about a month.  10. Follow up at the [facility] office.    I have received a copy of the above instructions and will follow them.

## 2015-05-31 NOTE — Transfer of Care (Signed)
Immediate Anesthesia Transfer of Care Note  Patient: Carla Schmitt  Procedure(s) Performed: Procedure(s) with comments: CATARACT EXTRACTION PHACO AND INTRAOCULAR LENS PLACEMENT (IOC) (Left) - CDE: 6.27  Patient Location: Short Stay  Anesthesia Type:MAC  Level of Consciousness: awake  Airway & Oxygen Therapy: Patient Spontanous Breathing  Post-op Assessment: Report given to RN  Post vital signs: Reviewed  Last Vitals:  Filed Vitals:   05/31/15 0715  BP: 129/59  Pulse:   Temp:   Resp: 14    Complications: No apparent anesthesia complications

## 2015-05-31 NOTE — Anesthesia Postprocedure Evaluation (Signed)
  Anesthesia Post-op Note  Patient: Carla Schmitt  Procedure(s) Performed: Procedure(s) with comments: CATARACT EXTRACTION PHACO AND INTRAOCULAR LENS PLACEMENT (IOC) (Left) - CDE: 6.27  Patient Location: Short Stay  Anesthesia Type:MAC  Level of Consciousness: awake, alert  and oriented  Airway and Oxygen Therapy: Patient Spontanous Breathing  Post-op Pain: none  Post-op Assessment: Post-op Vital signs reviewed, Patient's Cardiovascular Status Stable, Respiratory Function Stable, Patent Airway, No signs of Nausea or vomiting and Adequate PO intake              Post-op Vital Signs: Reviewed and stable  Last Vitals:  Filed Vitals:   05/31/15 0715  BP: 129/59  Pulse:   Temp:   Resp: 14    Complications: No apparent anesthesia complications

## 2015-06-01 ENCOUNTER — Encounter (HOSPITAL_COMMUNITY): Payer: Self-pay | Admitting: Ophthalmology

## 2015-06-07 ENCOUNTER — Ambulatory Visit (INDEPENDENT_AMBULATORY_CARE_PROVIDER_SITE_OTHER): Payer: Medicare Other

## 2015-06-07 DIAGNOSIS — Z23 Encounter for immunization: Secondary | ICD-10-CM

## 2015-06-20 ENCOUNTER — Ambulatory Visit (INDEPENDENT_AMBULATORY_CARE_PROVIDER_SITE_OTHER)
Admission: RE | Admit: 2015-06-20 | Discharge: 2015-06-20 | Disposition: A | Discharge status: Home / Self Care | Payer: Medicare Other | Source: Ambulatory Visit | Attending: Family Medicine | Admitting: Family Medicine

## 2015-06-20 DIAGNOSIS — Z780 Asymptomatic menopausal state: Secondary | ICD-10-CM | POA: Diagnosis not present

## 2015-09-04 HISTORY — PX: COLONOSCOPY: SHX174

## 2015-10-11 DIAGNOSIS — Z961 Presence of intraocular lens: Secondary | ICD-10-CM | POA: Diagnosis not present

## 2016-01-19 DIAGNOSIS — Z471 Aftercare following joint replacement surgery: Secondary | ICD-10-CM | POA: Diagnosis not present

## 2016-01-19 DIAGNOSIS — Z96653 Presence of artificial knee joint, bilateral: Secondary | ICD-10-CM | POA: Diagnosis not present

## 2016-01-29 ENCOUNTER — Other Ambulatory Visit: Payer: Self-pay | Admitting: Family Medicine

## 2016-04-17 ENCOUNTER — Encounter: Payer: Self-pay | Admitting: Gastroenterology

## 2016-05-04 ENCOUNTER — Ambulatory Visit (INDEPENDENT_AMBULATORY_CARE_PROVIDER_SITE_OTHER): Payer: Medicare HMO | Admitting: Family Medicine

## 2016-05-04 ENCOUNTER — Encounter: Payer: Self-pay | Admitting: Family Medicine

## 2016-05-04 VITALS — BP 140/70 | HR 92 | Temp 98.5°F | Ht 63.0 in | Wt 253.0 lb

## 2016-05-04 DIAGNOSIS — Z0000 Encounter for general adult medical examination without abnormal findings: Secondary | ICD-10-CM

## 2016-05-04 DIAGNOSIS — Z1159 Encounter for screening for other viral diseases: Secondary | ICD-10-CM

## 2016-05-04 DIAGNOSIS — Z23 Encounter for immunization: Secondary | ICD-10-CM

## 2016-05-04 LAB — HEPATIC FUNCTION PANEL
ALK PHOS: 91 U/L (ref 39–117)
ALT: 13 U/L (ref 0–35)
AST: 16 U/L (ref 0–37)
Albumin: 4.4 g/dL (ref 3.5–5.2)
BILIRUBIN TOTAL: 0.4 mg/dL (ref 0.2–1.2)
Bilirubin, Direct: 0.1 mg/dL (ref 0.0–0.3)
Total Protein: 7.9 g/dL (ref 6.0–8.3)

## 2016-05-04 LAB — CBC WITH DIFFERENTIAL/PLATELET
BASOS PCT: 0.5 % (ref 0.0–3.0)
Basophils Absolute: 0 10*3/uL (ref 0.0–0.1)
EOS ABS: 0.3 10*3/uL (ref 0.0–0.7)
EOS PCT: 3.6 % (ref 0.0–5.0)
HEMATOCRIT: 38 % (ref 36.0–46.0)
HEMOGLOBIN: 12.7 g/dL (ref 12.0–15.0)
LYMPHS PCT: 26.9 % (ref 12.0–46.0)
Lymphs Abs: 1.9 10*3/uL (ref 0.7–4.0)
MCHC: 33.3 g/dL (ref 30.0–36.0)
MCV: 83.2 fl (ref 78.0–100.0)
MONO ABS: 0.3 10*3/uL (ref 0.1–1.0)
Monocytes Relative: 4.2 % (ref 3.0–12.0)
Neutro Abs: 4.6 10*3/uL (ref 1.4–7.7)
Neutrophils Relative %: 64.8 % (ref 43.0–77.0)
Platelets: 312 10*3/uL (ref 150.0–400.0)
RBC: 4.57 Mil/uL (ref 3.87–5.11)
RDW: 15.2 % (ref 11.5–15.5)
WBC: 7.1 10*3/uL (ref 4.0–10.5)

## 2016-05-04 LAB — LIPID PANEL
CHOLESTEROL: 206 mg/dL — AB (ref 0–200)
HDL: 69.7 mg/dL (ref >39.00)
LDL CALC: 115 mg/dL — AB (ref 0–99)
NONHDL: 136.08
Total CHOL/HDL Ratio: 3
Triglycerides: 103 mg/dL (ref 0.0–149.0)
VLDL: 20.6 mg/dL (ref 0.0–40.0)

## 2016-05-04 LAB — BASIC METABOLIC PANEL
BUN: 22 mg/dL (ref 6–23)
CO2: 32 mEq/L (ref 19–32)
CREATININE: 0.81 mg/dL (ref 0.40–1.20)
Calcium: 9.7 mg/dL (ref 8.4–10.5)
Chloride: 103 mEq/L (ref 96–112)
GFR: 90.39 mL/min (ref >60.00)
GLUCOSE: 103 mg/dL — AB (ref 70–99)
POTASSIUM: 3.6 meq/L (ref 3.5–5.1)
Sodium: 141 mEq/L (ref 135–145)

## 2016-05-04 LAB — TSH: TSH: 1.71 u[IU]/mL (ref 0.35–4.50)

## 2016-05-04 LAB — HEPATITIS C ANTIBODY: HCV Ab: REACTIVE — AB

## 2016-05-04 MED ORDER — LOSARTAN POTASSIUM-HCTZ 100-25 MG PO TABS: 1.0000 | ORAL_TABLET | Freq: Every day | ORAL | 3 refills | Status: DC

## 2016-05-04 MED ORDER — AMLODIPINE BESYLATE 5 MG PO TABS: 5.0000 mg | ORAL_TABLET | Freq: Every day | ORAL | 3 refills | Status: DC

## 2016-05-04 NOTE — Progress Notes (Signed)
Subjective:     Patient ID: Carla Schmitt, female   DOB: May 16, 1946, 70 y.o.   MRN: [id number]  HPI Patient seen for physical exam. She has history of obesity and hypertension. She's had previous knee replacement surgery and is getting around good from an orthopedic standpoint. She's not had a mammogram in over couple years. Last colonoscopy about 12 years ago but she has been scheduled to follow-up with GI. She has no specific risk factors for hepatitis C but requests screening. She plans to set up mammogram soon. She's had previous hysterectomy for benign disease.  Gets very little exercise. Poor compliance with diet. History of prediabetes. No polyuria or polydipsia. She states much of her life she's had intermittent diarrhea with question IBS  Past Medical History:  Diagnosis Date  . Arthritis   . Colon polyps   . Headache(784.0)    tension, or allergy headaches  . History of kidney stones   . Hypertension   . Irritable bowel   . Stiffness of joint, shoulder region    left  . Tinnitus    both ears most of the time  . Varicose veins    both legs   Past Surgical History:  Procedure Laterality Date  . ABDOMINAL HYSTERECTOMY  02-09-2010   [facility]  . CATARACT EXTRACTION W/PHACO Right 05/17/2015   Procedure: CATARACT EXTRACTION PHACO AND INTRAOCULAR LENS PLACEMENT ([place]);  Surgeon: Rutherford Guys, MD;  Location: [facility];  Service: Ophthalmology;  Laterality: Right;  CDE:6.12  . CATARACT EXTRACTION W/PHACO Left 05/31/2015   Procedure: CATARACT EXTRACTION PHACO AND INTRAOCULAR LENS PLACEMENT (IOC);  Surgeon: Rutherford Guys, MD;  Location: [facility];  Service: Ophthalmology;  Laterality: Left;  CDE: 6.27  . FOOT SURGERY  many yrs ago   both feet  . TOTAL KNEE ARTHROPLASTY  11/05/2011   Procedure: TOTAL KNEE ARTHROPLASTY;  Surgeon: Gearlean Alf, MD;  Location: [facility];  Service: Orthopedics;  Laterality: Left;  . TOTAL KNEE ARTHROPLASTY Right 02/09/2013   Procedure: RIGHT TOTAL KNEE ARTHROPLASTY;   Surgeon: Gearlean Alf, MD;  Location: [facility];  Service: Orthopedics;  Laterality: Right;    reports that she quit smoking about 24 years ago. Her smoking use included Cigarettes. She has a 18.00 pack-year smoking history. She has never used smokeless tobacco. She reports that she does not drink alcohol or use drugs. family history includes Arthritis in her other; Diabetes in her brother; Heart disease in her brother, father, and other; Hypertension in her brother, father, and sister; Stroke in her other. Allergies  Allergen Reactions  . Cefuroxime Axetil      ceftin=hives     Review of Systems  Constitutional: Negative for activity change, appetite change, fatigue, fever and unexpected weight change.  HENT: Negative for ear pain, hearing loss, sore throat and trouble swallowing.   Eyes: Negative for visual disturbance.  Respiratory: Negative for cough, chest tightness, shortness of breath and wheezing.   Cardiovascular: Negative for chest pain, palpitations and leg swelling.  Gastrointestinal: Positive for diarrhea. Negative for abdominal pain, blood in stool, constipation, nausea and vomiting.  Endocrine: Negative for polydipsia and polyuria.  Genitourinary: Negative for dysuria and hematuria.  Musculoskeletal: Positive for arthralgias. Negative for back pain and myalgias.  Skin: Negative for rash.  Neurological: Negative for dizziness, seizures, syncope, weakness, light-headedness and headaches.  Hematological: Negative for adenopathy.  Psychiatric/Behavioral: Negative for confusion and dysphoric mood.       Objective:   Physical Exam  Constitutional: She is oriented to  person, place, and time. She appears well-developed and well-nourished.  HENT:  Head: Normocephalic and atraumatic.  Eyes: EOM are normal. Pupils are equal, round, and reactive to light.  Neck: Normal range of motion. Neck supple. No thyromegaly present.  Cardiovascular: Normal rate, regular rhythm and normal  heart sounds.   No murmur heard. Pulmonary/Chest: Breath sounds normal. No respiratory distress. She has no wheezes. She has no rales.  Abdominal: Soft. Bowel sounds are normal. She exhibits no distension and no mass. There is no tenderness. There is no rebound and no guarding.  Musculoskeletal: Normal range of motion. She exhibits no edema.  Lymphadenopathy:    She has no cervical adenopathy.  Neurological: She is alert and oriented to person, place, and time. She displays normal reflexes. No cranial nerve deficit.  Skin: No rash noted.  Psychiatric: She has a normal mood and affect. Her behavior is normal. Judgment and thought content normal.       Assessment:     #1 Physical exam. Patient needs mammogram and follow-up colonoscopy.  #2 hypertension stable  #3 obesity    Plan:     -Flu vaccine given -Obtain screening labs and include hepatitis C -No indication for Pap smear with prior hysterectomy for benign disease -Patient will set up follow-up mammogram -She is in process of getting colonoscopy scheduled -We discussed importance of losing some weight  Eulas Post MD [facility] at [facility]

## 2016-05-04 NOTE — Patient Instructions (Addendum)
Set up mammogram Try to lose some weight. We will call you with lab results.

## 2016-05-05 NOTE — Addendum Note (Signed)
Addended by: Eulas Post on: 05/05/2016 01:57 PM   Modules accepted: Orders

## 2016-05-08 LAB — HEPATITIS C RNA QUANTITATIVE: HCV Quantitative: NOT DETECTED IU/mL (ref <15)

## 2016-07-03 ENCOUNTER — Ambulatory Visit (INDEPENDENT_AMBULATORY_CARE_PROVIDER_SITE_OTHER): Payer: Medicare HMO | Admitting: Gastroenterology

## 2016-07-03 ENCOUNTER — Encounter: Payer: Self-pay | Admitting: Gastroenterology

## 2016-07-03 VITALS — BP 130/78 | HR 84 | Ht 63.0 in | Wt 256.0 lb

## 2016-07-03 DIAGNOSIS — Z1211 Encounter for screening for malignant neoplasm of colon: Secondary | ICD-10-CM

## 2016-07-03 DIAGNOSIS — K580 Irritable bowel syndrome with diarrhea: Secondary | ICD-10-CM

## 2016-07-03 MED ORDER — NA SULFATE-K SULFATE-MG SULF 17.5-3.13-1.6 GM/177ML PO SOLN: 1.0000 | Freq: Once | ORAL | 0 refills | Status: AC

## 2016-07-03 MED ORDER — DICYCLOMINE HCL 10 MG PO CAPS: 10.0000 mg | ORAL_CAPSULE | Freq: Two times a day (BID) | ORAL | 1 refills | Status: DC | PRN

## 2016-07-03 NOTE — Patient Instructions (Signed)
If you are age 68 or older, your body mass index should be between 23-30. Your Body mass index is 45.35 kg/m. If this is out of the aforementioned range listed, please consider follow up with your Primary Care Provider.  If you are age 106 or younger, your body mass index should be between 19-25. Your Body mass index is 45.35 kg/m. If this is out of the aformentioned range listed, please consider follow up with your Primary Care Provider.   You have been scheduled for a colonoscopy. Please follow written instructions given to you at your visit today.  Please pick up your prep supplies at the pharmacy within the next 1-3 days. If you use inhalers (even only as needed), please bring them with you on the day of your procedure. Your physician has requested that you go to www.startemmi.com and enter the access code given to you at your visit today. This web site gives a general overview about your procedure. However, you should still follow specific instructions given to you by our office regarding your preparation for the procedure.  Thank you for choosing [facility]  Dr Wilfrid Lund III

## 2016-07-03 NOTE — Progress Notes (Signed)
[facility] Consult Note:  History: Carla Schmitt 07/03/2016  Referring physician: Eulas Post, MD  Reason for consult/chief complaint: Irritable Bowel Syndrome (doing ok but discuss prn med treatment options) and Colon Cancer Screening (hx of polyps-unsure type, Dr Gala Romney over 10 years ago)   Subjective  HPI:  Ms. Carla Schmitt sees me to discuss IBS and colon cancer screening. She reports many years of IBS that was previously more severe and now only bothers her perhaps once a month. She knows most of her food triggers, which sometimes can be even certain smells or becoming "overheated". She will get some bandlike periumbilical to lower abdominal pain with bloating and nausea. It will last a couple of hours until she has several loose bowel movements and then slowly subsides. She denies rectal bleeding, her last routine colonoscopy was in 2002. She seems to recall that there may have been polyps.   ROS:  Review of Systems  Constitutional: Negative for appetite change and unexpected weight change.  HENT: Negative for mouth sores and voice change.   Eyes: Negative for pain and redness.  Respiratory: Negative for cough and shortness of breath.   Cardiovascular: Negative for chest pain and palpitations.  Genitourinary: Negative for dysuria and hematuria.  Musculoskeletal: Negative for arthralgias and myalgias.  Skin: Negative for pallor and rash.  Neurological: Negative for weakness and headaches.  Hematological: Negative for adenopathy.     Past Medical History: Past Medical History:  Diagnosis Date  . Arthritis   . Colon polyps   . Diverticulosis   . Headache(784.0)    tension, or allergy headaches  . History of kidney stones   . Hypertension   . Irritable bowel   . Stiffness of joint, shoulder region    left  . Tinnitus    both ears most of the time  . Varicose veins    both legs     Past Surgical History: Past Surgical History:  Procedure  Laterality Date  . ABDOMINAL HYSTERECTOMY  02-09-2010   TAH  . CATARACT EXTRACTION W/PHACO Right 05/17/2015   Procedure: CATARACT EXTRACTION PHACO AND INTRAOCULAR LENS PLACEMENT ([place]);  Surgeon: Rutherford Guys, MD;  Location: [facility];  Service: Ophthalmology;  Laterality: Right;  CDE:6.12  . CATARACT EXTRACTION W/PHACO Left 05/31/2015   Procedure: CATARACT EXTRACTION PHACO AND INTRAOCULAR LENS PLACEMENT ([facility]);  Surgeon: Rutherford Guys, MD;  Location: [facility];  Service: Ophthalmology;  Laterality: Left;  CDE: 6.27  . FOOT SURGERY  many yrs ago   both feet  . TOTAL KNEE ARTHROPLASTY  11/05/2011   Procedure: TOTAL KNEE ARTHROPLASTY;  Surgeon: Gearlean Alf, MD;  Location: [facility];  Service: Orthopedics;  Laterality: Left;  . TOTAL KNEE ARTHROPLASTY Right 02/09/2013   Procedure: RIGHT TOTAL KNEE ARTHROPLASTY;  Surgeon: Gearlean Alf, MD;  Location: [facility];  Service: Orthopedics;  Laterality: Right;     Family History: Family History  Problem Relation Age of Onset  . Arthritis Other   . Heart disease Other   . Stroke Other   . Heart disease Father   . Hypertension Father   . Hypertension Sister   . Diabetes Brother   . Heart disease Brother   . Hypertension Brother   . Colon polyps Mother   . Colon cancer Neg Hx     Social History: Social History   Social History  . Marital status: Married    Spouse name: N/A  . Number of children: 2  . Years of education: N/A   Occupational  History  . retired    Social History Main Topics  . Smoking status: Former Smoker    Packs/day: 1.50    Years: 12.00    Types: Cigarettes    Quit date: 06/04/1991  . Smokeless tobacco: Never Used  . Alcohol use No  . Drug use: No  . Sexual activity: No   Other Topics Concern  . None   Social History Narrative  . None    Allergies: Allergies  Allergen Reactions  . Cefuroxime Axetil      ceftin=hives    Outpatient Meds: Current Outpatient Prescriptions  Medication Sig Dispense Refill  .  amLODipine (NORVASC) 5 MG tablet Take 1 tablet (5 mg total) by mouth at bedtime. 90 tablet 3  . aspirin 81 MG tablet Take 81 mg by mouth daily.    Marland Kitchen CALCIUM PO Take 2 tablets by mouth 2 (two) times daily.    Marland Kitchen losartan-hydrochlorothiazide (HYZAAR) 100-25 MG tablet Take 1 tablet by mouth daily. 90 tablet 3  . Multiple Vitamins-Minerals (MULTIVITAMIN PO) Take by mouth. 2 gummies daily    . Tetrahydrozoline HCl (EYE DROPS OP) Apply to eye. Use 2 drops each eye 2-3 times daily    . dicyclomine (BENTYL) 10 MG capsule Take 1 capsule (10 mg total) by mouth 2 (two) times daily as needed for spasms. 30 capsule 1  . Na Sulfate-K Sulfate-Mg Sulf 17.5-3.13-1.6 GM/180ML SOLN Take 1 kit by mouth once. 354 mL 0   No current facility-administered medications for this visit.       ___________________________________________________________________ Objective   Exam:  BP 130/78   Pulse 84   Ht _0  (1.6 m)   Wt 256 lb (116.1 kg)   BMI 45.35 kg/m    General: this is a(n) Well-appearing obese elderly woman   Eyes: sclera anicteric, no redness  ENT: oral mucosa moist without lesions, no cervical or supraclavicular lymphadenopathy, good dentition  CV: RRR without murmur, S1/S2, no JVD, no peripheral edema  Resp: clear to auscultation bilaterally, normal RR and effort noted  GI: soft, no tenderness, with active bowel sounds. No guarding or palpable organomegaly noted. Limited by body habitus  Skin; warm and dry, no rash or jaundice noted  Neuro: awake, alert and oriented x 3. Normal gross motor function and fluent speech  Labs: CBC    Component Value Date/Time   WBC 7.1 05/04/2016 1037   RBC 4.57 05/04/2016 1037   HGB 12.7 05/04/2016 1037   HCT 38.0 05/04/2016 1037   PLT 312.0 05/04/2016 1037   MCV 83.2 05/04/2016 1037   MCH 27.9 02/11/2013 0433   MCHC 33.3 05/04/2016 1037   RDW 15.2 05/04/2016 1037   LYMPHSABS 1.9 05/04/2016 1037   MONOABS 0.3 05/04/2016 1037   EOSABS 0.3  05/04/2016 1037   BASOSABS 0.0 05/04/2016 1037   CMP Latest Ref Rng & Units 05/04/2016 04/14/2015 04/29/2014  Glucose 70 - 99 mg/dL 103(H) 109(H) 100(H)  BUN 6 - 23 mg/dL _1 Creatinine 0.40 - 1.20 mg/dL 0.81 0.82 0.8  Sodium 135 - 145 mEq/L 141 142 139  Potassium 3.5 - 5.1 mEq/L 3.6 4.4 4.2  Chloride 96 - 112 mEq/L 103 102 102  CO2 19 - 32 mEq/L 32 31 30  Calcium 8.4 - 10.5 mg/dL 9.7 10.3 10.2  Total Protein 6.0 - 8.3 g/dL 7.9 7.7 -  Total Bilirubin 0.2 - 1.2 mg/dL 0.4 0.4 -  Alkaline Phos 39 - 117 U/L 91 80 -  AST 0 - 37  U/L 16 18 -  ALT 0 - 35 U/L 13 13 -     Assessment/Plan: Encounter Diagnoses  Name Primary?  . Irritable bowel syndrome with diarrhea Yes  . Special screening for malignant neoplasms, colon   . Severe obesity (BMI >= 40) (HCC)     I prescribe dicyclomine to take as needed She is scheduled for a screening colonoscopy   Thank you for the courtesy of this consult.  Please call me with any questions or concerns.  Nelida Meuse III  CC: Eulas Post, MD

## 2016-07-11 ENCOUNTER — Encounter: Payer: Medicare HMO | Admitting: Gastroenterology

## 2016-07-13 ENCOUNTER — Encounter: Payer: Self-pay | Admitting: Gastroenterology

## 2016-07-13 ENCOUNTER — Ambulatory Visit (AMBULATORY_SURGERY_CENTER): Payer: Medicare HMO | Admitting: Gastroenterology

## 2016-07-13 VITALS — BP 143/70 | HR 75 | Temp 98.0°F | Resp 13 | Ht 63.0 in | Wt 256.0 lb

## 2016-07-13 DIAGNOSIS — Z1212 Encounter for screening for malignant neoplasm of rectum: Secondary | ICD-10-CM

## 2016-07-13 DIAGNOSIS — D123 Benign neoplasm of transverse colon: Secondary | ICD-10-CM | POA: Diagnosis not present

## 2016-07-13 DIAGNOSIS — Z1211 Encounter for screening for malignant neoplasm of colon: Secondary | ICD-10-CM

## 2016-07-13 DIAGNOSIS — I10 Essential (primary) hypertension: Secondary | ICD-10-CM | POA: Diagnosis not present

## 2016-07-13 MED ORDER — SODIUM CHLORIDE 0.9 % IV SOLN: 500.0000 mL | INTRAVENOUS | Status: DC

## 2016-07-13 NOTE — Progress Notes (Signed)
Called to room to assist during endoscopic procedure.  Patient ID and intended procedure confirmed with present staff. Received instructions for my participation in the procedure from the performing physician.  

## 2016-07-13 NOTE — Op Note (Signed)
[place] Patient Name: Carla Schmitt Procedure Date: 07/13/2016 2:26 PM MRN: [id number] Endoscopist: Mallie Mussel L. Loletha Carrow , MD Age: 70 Referring MD:  Date of Birth: Mar 12, 1946 Gender: Female Account #: [id number] Procedure:                Colonoscopy Indications:              Screening for colorectal malignant neoplasm Medicines:                Monitored Anesthesia Care Procedure:                Pre-Anesthesia Assessment:                           - Prior to the procedure, a History and Physical                            was performed, and patient medications and                            allergies were reviewed. The patient's tolerance of                            previous anesthesia was also reviewed. The risks                            and benefits of the procedure and the sedation                            options and risks were discussed with the patient.                            All questions were answered, and informed consent                            was obtained. Anticoagulants: The patient has taken                            aspirin. It was decided not to withhold this                            medication prior to the procedure. ASA Grade                            Assessment: III - A patient with severe systemic                            disease. After reviewing the risks and benefits,                            the patient was deemed in satisfactory condition to                            undergo the procedure.  After obtaining informed consent, the colonoscope                            was passed under direct vision. Throughout the                            procedure, the patient's blood pressure, pulse, and                            oxygen saturations were monitored continuously. The                            Model CF-HQ190L [id number]) scope was introduced                            through the anus and advanced to the  the cecum,                            identified by appendiceal orifice and ileocecal                            valve. The [id number]) was introduced                            through the and advanced to the. The colonoscopy                            was performed with difficulty due to multiple                            diverticula in the colon. Successful completion of                            the procedure was aided by withdrawing the scope                            and replacing with the pediatric colonoscope and                            applying abdominal pressure. The patient tolerated                            the procedure well. The quality of the bowel                            preparation was good. The ileocecal valve,                            appendiceal orifice, and rectum were photographed.                            The quality of the bowel preparation was evaluated  using the BBPS [facility] Bowel Preparation Scale)                            with scores of: Right Colon = 2, Transverse Colon =                            3 and Left Colon = 2. The total BBPS score equals                            7. The bowel preparation used was SUPREP. Scope In: 2:34:10 PM Scope Out: 2:54:28 PM Scope Withdrawal Time: 0 hours 8 minutes 53 seconds  Total Procedure Duration: 0 hours 20 minutes 18 seconds  Findings:                 The perianal and digital rectal examinations were                            normal.                           A 2 mm polyp was found in the mid transverse colon.                            The polyp was sessile. The polyp was removed with a                            cold snare. Resection and retrieval were complete.                           Multiple diverticula were found in the left colon                            and right colon.                           The exam was otherwise without abnormality on                             direct and retroflexion views. Complications:            No immediate complications. Estimated Blood Loss:     Estimated blood loss: none. Impression:               - One 2 mm polyp in the mid transverse colon,                            removed with a cold snare. Resected and retrieved.                           - Diverticulosis in the left colon and in the right                            colon.                           -  The examination was otherwise normal on direct                            and retroflexion views. Recommendation:           - Patient has a contact number available for                            emergencies. The signs and symptoms of potential                            delayed complications were discussed with the                            patient. Return to normal activities tomorrow.                            Written discharge instructions were provided to the                            patient.                           - Resume previous diet.                           - Resume aspirin at prior dose in 3 days.                           - Await pathology results.                           - Repeat colonoscopy is recommended for                            surveillance. The colonoscopy date will be                            determined after pathology results from today's                            exam become available for review. Henry L. Loletha Carrow, MD 07/13/2016 2:58:24 PM This report has been signed electronically.

## 2016-07-13 NOTE — Patient Instructions (Signed)
YOU HAD AN ENDOSCOPIC PROCEDURE TODAY AT [facility]:   Refer to the procedure report that was given to you for any specific questions about what was found during the examination.  If the procedure report does not answer your questions, please call your gastroenterologist to clarify.  If you requested that your care partner not be given the details of your procedure findings, then the procedure report has been included in a sealed envelope for you to review at your convenience later.  YOU SHOULD EXPECT: Some feelings of bloating in the abdomen. Passage of more gas than usual.  Walking can help get rid of the air that was put into your GI tract during the procedure and reduce the bloating. If you had a lower endoscopy (such as a colonoscopy or flexible sigmoidoscopy) you may notice spotting of blood in your stool or on the toilet paper. If you underwent a bowel prep for your procedure, you may not have a normal bowel movement for a few days.  Please Note:  You might notice some irritation and congestion in your nose or some drainage.  This is from the oxygen used during your procedure.  There is no need for concern and it should clear up in a day or so.  SYMPTOMS TO REPORT IMMEDIATELY:   Following lower endoscopy (colonoscopy or flexible sigmoidoscopy):  Excessive amounts of blood in the stool  Significant tenderness or worsening of abdominal pains  Swelling of the abdomen that is new, acute  Fever of 100F or higher  For urgent or emergent issues, a gastroenterologist can be reached at any hour by calling [phone].  DIET:  We do recommend a small meal at first, but then you may proceed to your regular diet.  Drink plenty of fluids but you should avoid alcoholic beverages for 24 hours.  ACTIVITY:  You should plan to take it easy for the rest of today and you should NOT DRIVE or use heavy machinery until tomorrow (because of the sedation medicines used during the test).     FOLLOW UP: Our staff will call the number listed on your records the next business day following your procedure to check on you and address any questions or concerns that you may have regarding the information given to you following your procedure. If we do not reach you, we will leave a message.  However, if you are feeling well and you are not experiencing any problems, there is no need to return our call.  We will assume that you have returned to your regular daily activities without incident.  If any biopsies were taken you will be contacted by phone or by letter within the next 1-3 weeks.  Please call us at [phone] if you have not heard about the biopsies in 3 weeks.   SIGNATURES/CONFIDENTIALITY: You and/or your care partner have signed paperwork which will be entered into your electronic medical record.  These signatures attest to the fact that that the information above on your After Visit Summary has been reviewed and is understood.  Full responsibility of the confidentiality of this discharge information lies with you and/or your care-partner.  RESUME ASPIRIN IN 3 DAYS (MONDAY)  Please read over handouts about polyps and diverticulosis  Please continue other medications  Await pathology

## 2016-07-13 NOTE — Progress Notes (Signed)
To recovery, report to [place], RN, VSS 

## 2016-07-16 ENCOUNTER — Telehealth: Payer: Self-pay | Admitting: *Deleted

## 2016-07-16 NOTE — Telephone Encounter (Signed)
  Follow up Call-  Call back number 07/13/2016  Post procedure Call Back phone  # [phone]  Permission to leave phone message Yes  Some recent data might be hidden     Patient questions:  Do you have a fever, pain , or abdominal swelling? No. Pain Score  0 *  Have you tolerated food without any problems? Yes.    Have you been able to return to your normal activities? Yes.    Do you have any questions about your discharge instructions: Diet   No. Medications  No. Follow up visit  No.  Do you have questions or concerns about your Care? No.  Actions: * If pain score is 4 or above: No action needed, pain <4.

## 2016-07-19 ENCOUNTER — Encounter: Payer: Self-pay | Admitting: Gastroenterology

## 2016-09-03 HISTORY — PX: BREAST BIOPSY: SHX20

## 2016-10-09 DIAGNOSIS — H52203 Unspecified astigmatism, bilateral: Secondary | ICD-10-CM | POA: Diagnosis not present

## 2016-10-09 DIAGNOSIS — H524 Presbyopia: Secondary | ICD-10-CM | POA: Diagnosis not present

## 2016-10-09 DIAGNOSIS — Z961 Presence of intraocular lens: Secondary | ICD-10-CM | POA: Diagnosis not present

## 2017-01-10 DIAGNOSIS — Z460 Encounter for fitting and adjustment of spectacles and contact lenses: Secondary | ICD-10-CM | POA: Diagnosis not present

## 2017-04-08 ENCOUNTER — Other Ambulatory Visit: Payer: Self-pay | Admitting: Family Medicine

## 2017-04-08 DIAGNOSIS — Z1231 Encounter for screening mammogram for malignant neoplasm of breast: Secondary | ICD-10-CM

## 2017-04-11 ENCOUNTER — Other Ambulatory Visit: Payer: Self-pay | Admitting: Family Medicine

## 2017-04-19 ENCOUNTER — Ambulatory Visit
Admission: RE | Admit: 2017-04-19 | Discharge: 2017-04-19 | Disposition: A | Discharge status: Home / Self Care | Payer: Medicare HMO | Source: Ambulatory Visit | Attending: Family Medicine | Admitting: Family Medicine

## 2017-04-19 DIAGNOSIS — Z1231 Encounter for screening mammogram for malignant neoplasm of breast: Secondary | ICD-10-CM | POA: Diagnosis not present

## 2017-04-23 ENCOUNTER — Other Ambulatory Visit: Payer: Self-pay | Admitting: Family Medicine

## 2017-04-23 DIAGNOSIS — R928 Other abnormal and inconclusive findings on diagnostic imaging of breast: Secondary | ICD-10-CM

## 2017-04-26 ENCOUNTER — Ambulatory Visit: Payer: Medicare HMO

## 2017-04-26 ENCOUNTER — Other Ambulatory Visit: Payer: Self-pay | Admitting: Family Medicine

## 2017-04-26 ENCOUNTER — Ambulatory Visit
Admission: RE | Admit: 2017-04-26 | Discharge: 2017-04-26 | Disposition: A | Discharge status: Home / Self Care | Payer: Medicare HMO | Source: Ambulatory Visit | Attending: Family Medicine | Admitting: Family Medicine

## 2017-04-26 DIAGNOSIS — R921 Mammographic calcification found on diagnostic imaging of breast: Secondary | ICD-10-CM

## 2017-04-26 DIAGNOSIS — R928 Other abnormal and inconclusive findings on diagnostic imaging of breast: Secondary | ICD-10-CM

## 2017-04-29 ENCOUNTER — Ambulatory Visit
Admission: RE | Admit: 2017-04-29 | Discharge: 2017-04-29 | Disposition: A | Discharge status: Home / Self Care | Payer: Medicare HMO | Source: Ambulatory Visit | Attending: Family Medicine | Admitting: Family Medicine

## 2017-04-29 DIAGNOSIS — R921 Mammographic calcification found on diagnostic imaging of breast: Secondary | ICD-10-CM | POA: Diagnosis not present

## 2017-04-29 DIAGNOSIS — D242 Benign neoplasm of left breast: Secondary | ICD-10-CM | POA: Diagnosis not present

## 2017-05-08 ENCOUNTER — Ambulatory Visit (INDEPENDENT_AMBULATORY_CARE_PROVIDER_SITE_OTHER): Payer: Medicare HMO | Admitting: Family Medicine

## 2017-05-08 ENCOUNTER — Encounter: Payer: Self-pay | Admitting: Family Medicine

## 2017-05-08 VITALS — BP 120/78 | HR 88 | Temp 98.4°F | Ht 63.0 in | Wt 228.0 lb

## 2017-05-08 DIAGNOSIS — E669 Obesity, unspecified: Secondary | ICD-10-CM | POA: Diagnosis not present

## 2017-05-08 DIAGNOSIS — R7303 Prediabetes: Secondary | ICD-10-CM | POA: Diagnosis not present

## 2017-05-08 DIAGNOSIS — I10 Essential (primary) hypertension: Secondary | ICD-10-CM | POA: Diagnosis not present

## 2017-05-08 DIAGNOSIS — Z23 Encounter for immunization: Secondary | ICD-10-CM

## 2017-05-08 LAB — BASIC METABOLIC PANEL
BUN: 15 mg/dL (ref 6–23)
CALCIUM: 10.1 mg/dL (ref 8.4–10.5)
CO2: 28 mEq/L (ref 19–32)
Chloride: 104 mEq/L (ref 96–112)
Creatinine, Ser: 0.75 mg/dL (ref 0.40–1.20)
GFR: 98.49 mL/min (ref >60.00)
Glucose, Bld: 99 mg/dL (ref 70–99)
POTASSIUM: 4.2 meq/L (ref 3.5–5.1)
SODIUM: 142 meq/L (ref 135–145)

## 2017-05-08 MED ORDER — AMLODIPINE BESYLATE 5 MG PO TABS: ORAL_TABLET | ORAL | 3 refills | Status: DC

## 2017-05-08 MED ORDER — LOSARTAN POTASSIUM-HCTZ 100-25 MG PO TABS: 1.0000 | ORAL_TABLET | Freq: Every day | ORAL | 3 refills | Status: DC

## 2017-05-08 MED ORDER — TRIAMCINOLONE ACETONIDE 0.025 % EX OINT: 1.0000 "application " | TOPICAL_OINTMENT | Freq: Two times a day (BID) | CUTANEOUS | 1 refills | Status: DC | PRN

## 2017-05-08 NOTE — Progress Notes (Signed)
Subjective:     Patient ID: Carla Schmitt, female   DOB: 04/11/1946, 71 y.o.   MRN: [id number]  HPI [name] seen for medical follow-up. She has history of obesity, osteoarthritis, hypertension. She's done excellent job with restricting calories and has lost 25 pounds since last year. She has good appetite. She takes amlodipine and losartan HCTZ for hypertension. Blood pressures been well controlled.  She's had previous shingles vaccine but has questions about the new one. She would like to pursue that.  She has history of prediabetes with fasting blood sugars over 100. No polyuria or polydipsia.  Past Medical History:  Diagnosis Date  . Arthritis   . Colon polyps   . Diverticulosis   . Headache(784.0)    tension, or allergy headaches  . History of kidney stones   . Hypertension   . Irritable bowel   . Stiffness of joint, shoulder region    left  . Tinnitus    both ears most of the time  . Varicose veins    both legs   Past Surgical History:  Procedure Laterality Date  . ABDOMINAL HYSTERECTOMY  02-09-2010   TAH  . CATARACT EXTRACTION W/PHACO Right 05/17/2015   Procedure: CATARACT EXTRACTION PHACO AND INTRAOCULAR LENS PLACEMENT ([place]);  Surgeon: Rutherford Guys, MD;  Location: [facility];  Service: Ophthalmology;  Laterality: Right;  CDE:6.12  . CATARACT EXTRACTION W/PHACO Left 05/31/2015   Procedure: CATARACT EXTRACTION PHACO AND INTRAOCULAR LENS PLACEMENT ([facility]);  Surgeon: Rutherford Guys, MD;  Location: [facility];  Service: Ophthalmology;  Laterality: Left;  CDE: 6.27  . FOOT SURGERY  many yrs ago   both feet  . TOTAL KNEE ARTHROPLASTY  11/05/2011   Procedure: TOTAL KNEE ARTHROPLASTY;  Surgeon: Gearlean Alf, MD;  Location: [facility];  Service: Orthopedics;  Laterality: Left;  . TOTAL KNEE ARTHROPLASTY Right 02/09/2013   Procedure: RIGHT TOTAL KNEE ARTHROPLASTY;  Surgeon: Gearlean Alf, MD;  Location: [facility];  Service: Orthopedics;  Laterality: Right;    reports that she quit smoking about 25  years ago. Her smoking use included Cigarettes. She has a 18.00 pack-year smoking history. She has never used smokeless tobacco. She reports that she does not drink alcohol or use drugs. family history includes Arthritis in her other; Colon polyps in her mother; Diabetes in her brother; Heart disease in her brother, father, and other; Hypertension in her brother, father, and sister; Stroke in her other. Allergies  Allergen Reactions  . Cefuroxime Axetil      ceftin=hives     Review of Systems  Constitutional: Negative for fatigue.  Eyes: Negative for visual disturbance.  Respiratory: Negative for cough, chest tightness, shortness of breath and wheezing.   Cardiovascular: Negative for chest pain, palpitations and leg swelling.  Endocrine: Negative for polydipsia and polyuria.  Musculoskeletal: Positive for arthralgias.  Neurological: Negative for dizziness, seizures, syncope, weakness, light-headedness and headaches.       Objective:   Physical Exam  Constitutional: She appears well-developed and well-nourished.  Eyes: Pupils are equal, round, and reactive to light.  Neck: Neck supple. No JVD present. No thyromegaly present.  Cardiovascular: Normal rate and regular rhythm.  Exam reveals no gallop.   Pulmonary/Chest: Effort normal and breath sounds normal. No respiratory distress. She has no wheezes. She has no rales.  Musculoskeletal: She exhibits no edema.  Neurological: She is alert.       Assessment:     #1 hypertension stable and at goal  #2 obesity. She's done excellent  job with weight loss this past year  #3 history of prediabetes  #4 health maintenance. Patient has questions regarding shingles vaccine. Also needs flu vaccine      Plan:     -Prescription for new shingles vaccine given -she will get at pharmacy -Flu vaccine given today  -Check basic metabolic panel  -Refilled medications for one year  -Continue weight loss efforts and routine follow-up in 6  months   Eulas Post MD [facility]

## 2017-05-13 ENCOUNTER — Emergency Department (HOSPITAL_COMMUNITY)
Admission: EM | Admit: 2017-05-13 | Discharge: 2017-05-13 | Disposition: A | Discharge status: Home / Self Care | Payer: Medicare HMO | Attending: Emergency Medicine | Admitting: Emergency Medicine

## 2017-05-13 ENCOUNTER — Encounter (HOSPITAL_COMMUNITY): Payer: Self-pay | Admitting: Emergency Medicine

## 2017-05-13 ENCOUNTER — Emergency Department (HOSPITAL_COMMUNITY): Payer: Medicare HMO

## 2017-05-13 DIAGNOSIS — Z7982 Long term (current) use of aspirin: Secondary | ICD-10-CM | POA: Diagnosis not present

## 2017-05-13 DIAGNOSIS — Z79899 Other long term (current) drug therapy: Secondary | ICD-10-CM | POA: Diagnosis not present

## 2017-05-13 DIAGNOSIS — I10 Essential (primary) hypertension: Secondary | ICD-10-CM | POA: Insufficient documentation

## 2017-05-13 DIAGNOSIS — M545 Low back pain, unspecified: Secondary | ICD-10-CM

## 2017-05-13 DIAGNOSIS — Z87891 Personal history of nicotine dependence: Secondary | ICD-10-CM | POA: Insufficient documentation

## 2017-05-13 LAB — URINALYSIS, ROUTINE W REFLEX MICROSCOPIC
BILIRUBIN URINE: NEGATIVE
GLUCOSE, UA: NEGATIVE mg/dL
Hgb urine dipstick: NEGATIVE
KETONES UR: 20 mg/dL — AB
NITRITE: NEGATIVE
PH: 5 (ref 5.0–8.0)
Protein, ur: NEGATIVE mg/dL
SPECIFIC GRAVITY, URINE: 1.02 (ref 1.005–1.030)

## 2017-05-13 MED ORDER — METHYLPREDNISOLONE 4 MG PO TBPK: ORAL_TABLET | ORAL | 0 refills | Status: DC

## 2017-05-13 NOTE — ED Triage Notes (Signed)
Pt c/o right lower back pain with no radiation or urinary sxs x 2-3 days. Took tylenol this am with no relief. Nad.

## 2017-05-13 NOTE — ED Notes (Signed)
New onset lower back pain radiating down rt leg. Denies any recent injury. Ambulated to room without difficulty. Denies any difficulty with gi/gu

## 2017-05-13 NOTE — Discharge Instructions (Signed)
Please read attached information regarding your condition. Take steroids and tapered dose pack as directed. Complete stretches and apply heat to affected area as directed. Follow-up with PCP for further evaluation. Return to ED for worsening back pain, numbness, injuries, falls, trouble walking, loss of bladder function.

## 2017-05-13 NOTE — ED Provider Notes (Signed)
[facility] Provider Note   CSN: [id number] Arrival date & time: 05/13/17  1127     History   Chief Complaint Chief Complaint  Patient presents with  . Back Pain    HPI Carla Schmitt is a 71 y.o. female.  HPI  Patient, with past medical history of arthritis, kidney stones, presents to ED for evaluation of right-sided low back pain radiating down the right leg. She is unsure if it was caused by movement when it began 3 days ago. She has tried extra strength Tylenol with some relief in her symptoms. She denies any previous history of similar symptoms. She denies any numbness, weakness, urinary incontinence, fevers, prior back surgery, history of cancer, history of IV drug use, trouble with ambulation or falls.  Past Medical History:  Diagnosis Date  . Arthritis   . Colon polyps   . Diverticulosis   . Headache(784.0)    tension, or allergy headaches  . History of kidney stones   . Hypertension   . Irritable bowel   . Stiffness of joint, shoulder region    left  . Tinnitus    both ears most of the time  . Varicose veins    both legs    Patient Active Problem List   Diagnosis Date Noted  . Prediabetes 05/08/2017  . Severe obesity (BMI >= 40) ([place]) 05/01/2013  . Knee pain 03/03/2013  . Knee stiffness 03/03/2013  . Postoperative anemia due to acute blood loss 02/10/2013  . Postop Hypokalemia 11/07/2011  . Postop Acute blood loss anemia 11/07/2011  . Postop Hyponatremia 11/06/2011  . Hypertension 09/28/2011  . Osteoarthritis, knee 06/04/2011  . Obesity 06/04/2011    Past Surgical History:  Procedure Laterality Date  . ABDOMINAL HYSTERECTOMY  02-09-2010   TAH  . CATARACT EXTRACTION W/PHACO Right 05/17/2015   Procedure: CATARACT EXTRACTION PHACO AND INTRAOCULAR LENS PLACEMENT ([place]);  Surgeon: Rutherford Guys, MD;  Location: [facility];  Service: Ophthalmology;  Laterality: Right;  CDE:6.12  . CATARACT EXTRACTION W/PHACO Left 05/31/2015   Procedure: CATARACT  EXTRACTION PHACO AND INTRAOCULAR LENS PLACEMENT ([facility]);  Surgeon: Rutherford Guys, MD;  Location: [facility];  Service: Ophthalmology;  Laterality: Left;  CDE: 6.27  . FOOT SURGERY  many yrs ago   both feet  . TOTAL KNEE ARTHROPLASTY  11/05/2011   Procedure: TOTAL KNEE ARTHROPLASTY;  Surgeon: Gearlean Alf, MD;  Location: [facility];  Service: Orthopedics;  Laterality: Left;  . TOTAL KNEE ARTHROPLASTY Right 02/09/2013   Procedure: RIGHT TOTAL KNEE ARTHROPLASTY;  Surgeon: Gearlean Alf, MD;  Location: [facility];  Service: Orthopedics;  Laterality: Right;    OB History    No data available       Home Medications    Prior to Admission medications   Medication Sig Start Date End Date Taking? Authorizing Provider  amLODipine (NORVASC) 5 MG tablet TAKE 1 TABLET(5 MG) BY MOUTH AT BEDTIME 05/08/17   Burchette, Alinda Sierras, MD  aspirin 81 MG tablet Take 81 mg by mouth daily.    [provider]  CALCIUM PO Take 2 tablets by mouth 2 (two) times daily.    [provider]  dicyclomine (BENTYL) 10 MG capsule Take 1 capsule (10 mg total) by mouth 2 (two) times daily as needed for spasms. 07/03/16   Doran Stabler, MD  losartan-hydrochlorothiazide (HYZAAR) 100-25 MG tablet Take 1 tablet by mouth daily. 05/08/17   Burchette, Alinda Sierras, MD  methylPREDNISolone (MEDROL DOSEPAK) 4 MG TBPK tablet Taper  over 6 days. 05/13/17   Namira Rosekrans, PA-C  Multiple Vitamins-Minerals (MULTIVITAMIN PO) Take by mouth. 2 gummies daily    [provider]  Tetrahydrozoline HCl (EYE DROPS OP) Apply to eye. Use 2 drops each eye 2-3 times daily    [provider]  triamcinolone (KENALOG) 0.025 % ointment Apply 1 application topically 2 (two) times daily as needed. 05/08/17   Burchette, Alinda Sierras, MD    Family History Family History  Problem Relation Age of Onset  . Arthritis Other   . Heart disease Other   . Stroke Other   . Heart disease Father   . Hypertension Father   . Hypertension Sister   . Diabetes  Brother   . Heart disease Brother   . Hypertension Brother   . Colon polyps Mother   . Colon cancer Neg Hx     Social History Social History  Substance Use Topics  . Smoking status: Former Smoker    Packs/day: 1.50    Years: 12.00    Types: Cigarettes    Quit date: 06/04/1991  . Smokeless tobacco: Never Used  . Alcohol use No     Allergies   Cefuroxime axetil   Review of Systems Review of Systems  Constitutional: Negative for chills and fever.  Genitourinary: Negative for dysuria and hematuria.  Musculoskeletal: Positive for back pain. Negative for arthralgias, gait problem, joint swelling, myalgias and neck pain.  Skin: Negative for color change and wound.  Neurological: Negative for syncope, weakness and numbness.     Physical Exam Updated Vital Signs BP (!) 159/68 (BP Location: Left Arm)   Pulse 77   Temp 98.5 F (36.9 C) (Oral)   Resp 18   Ht 5\' 3"  (1.6 m)   Wt 102.1 kg (225 lb)   SpO2 100%   BMI 39.86 kg/m   Physical Exam  Constitutional: She appears well-developed and well-nourished. No distress.  HENT:  Head: Normocephalic and atraumatic.  Eyes: Conjunctivae and EOM are normal. No scleral icterus.  Neck: Normal range of motion.  Pulmonary/Chest: Effort normal. No respiratory distress.  Musculoskeletal: Normal range of motion. She exhibits tenderness. She exhibits no edema or deformity.  Tenderness to palpation of the right paraspinal musculature on the right side. Positive CVA tenderness. No midline spinal tenderness present in lumbar, thoracic or cervical spine. No step-off palpated. No visible bruising, edema or temperature change noted. No objective signs of numbness present. No saddle anesthesia. 2+ DP pulses bilaterally. Sensation intact to light touch. Strength 5/5 in bilateral lower extremities.  Neurological: She is alert.  Skin: No rash noted. She is not diaphoretic.  Psychiatric: She has a normal mood and affect.  Nursing note and vitals  reviewed.    ED Treatments / Results  Labs (all labs ordered are listed, but only abnormal results are displayed) Labs Reviewed  URINALYSIS, ROUTINE W REFLEX MICROSCOPIC - Abnormal; Notable for the following:       Result Value   APPearance HAZY (*)    Ketones, ur 20 (*)    Leukocytes, UA TRACE (*)    Bacteria, UA RARE (*)    Squamous Epithelial / LPF 6-30 (*)    All other components within normal limits  URINE CULTURE    EKG  EKG Interpretation None       Radiology Dg Lumbar Spine Complete  Result Date: 05/13/2017 CLINICAL DATA:  NON RADIATING LOWER BACK PAIN SINCE 05-10-17, NO KNOWN INJURY TAYLOR GENTLE ASSISTED WITH EXAM EXAM: LUMBAR SPINE - COMPLETE  4+ VIEW COMPARISON:  12/16/2005 FINDINGS: There are mild degenerative changes especially involving the lower thoracic and lower lumbar spine. No acute fracture. There is moderate facet hypertrophy in the lower lumbar levels, particularly involving L3-4, 4 5, and L5-S1. // there is 2 mm anterolisthesis of L3 on L4. No suspicious lytic or blastic lesions are identified. IMPRESSION: Moderate lower thoracic and lower lumbar degenerative changes. No evidence for acute abnormality. Electronically Signed   By: Nolon Nations M.D.   On: 05/13/2017 14:07    Procedures Procedures (including critical care time)  Medications Ordered in ED Medications - No data to display   Initial Impression / Assessment and Plan / ED Course  I have reviewed the triage vital signs and the nursing notes.  Pertinent labs & imaging results that were available during my care of the patient were reviewed by me and considered in my medical decision making (see chart for details).     Patient presents to the ED for three-day history of right-sided back pain radiating down right leg. Unsure if caused by movement. She denies any falls, injuries, numbness, trouble walking, urinary incontinence, history of cancer, history of IV drug there are no focal deficits  on neurological exam she has no signs of saddle anesthesia. She is ambulatory here in the ED with normal gait. She is afebrile with no history of fever. She does have right-sided CVA tenderness and tenderness to palpation of the area. Urinalysis showed some bacteria and leukocytes but will be sent for culture. No treatment at this time. X-ray showed evidence of degenerative changes. I suspect that her symptoms could be due to sciatica. I have low suspicion for cauda equina or other acute spinal cord injury being the cause of her back pain. I have low suspicion for infectious cause of her back pain. We'll discharge with steroid dose pack and advised Tylenol to be taken as needed. We'll also give instructions for heat therapy and stretching as tolerated. Encourage patient to follow up with her PCP for further evaluation. Patient appears stable for discharge at this time. Strict return precautions given.  Final Clinical Impressions(s) / ED Diagnoses   Final diagnoses:  Acute right-sided low back pain without sciatica    New Prescriptions New Prescriptions   METHYLPREDNISOLONE (MEDROL DOSEPAK) 4 MG TBPK TABLET    Taper over 6 days.     Delia Heady, PA-C 05/13/17 [place], [place], DO 05/15/17 [phone]

## 2017-05-15 LAB — URINE CULTURE

## 2017-05-21 ENCOUNTER — Telehealth: Payer: Self-pay

## 2017-05-21 NOTE — Telephone Encounter (Signed)
Spoke with pt and advised. She is scheduled to see Dr. Elease Hashimoto tomorrow. Nothing further needed at this time.

## 2017-05-21 NOTE — Telephone Encounter (Signed)
She developed this pain after we saw her last. My only concern with ordering this without seeing her first is whether insurance would deny coverage. Realize  she was seen in the ER for this but we have not seen her for this problem. To be safe, would probably recommend follow-up here first and that we can reexamine and schedule from that visit if indicated

## 2017-05-21 NOTE — Telephone Encounter (Signed)
Patient has called to report continued pain in her right side and right lower back. She was seen in ED for this on 05/13/17. She is requesting to have an MRI of her back. She denies any urinary s/s, abdominal pain or fever. She denies any radiation down her buttock or leg or any numbness/tingling.   Dr. Elease Hashimoto - Please advise. Thanks!

## 2017-05-22 ENCOUNTER — Ambulatory Visit (INDEPENDENT_AMBULATORY_CARE_PROVIDER_SITE_OTHER): Payer: Medicare HMO | Admitting: Family Medicine

## 2017-05-22 ENCOUNTER — Encounter: Payer: Self-pay | Admitting: Family Medicine

## 2017-05-22 VITALS — BP 138/62 | HR 75 | Temp 98.1°F | Ht 63.0 in | Wt 220.0 lb

## 2017-05-22 DIAGNOSIS — M545 Low back pain, unspecified: Secondary | ICD-10-CM

## 2017-05-22 NOTE — Progress Notes (Signed)
Subjective:     Patient ID: Carla Schmitt, female   DOB: 1946-03-08, 71 y.o.   MRN: [id number]  HPI Patient seen for evaluation of right flank and lumbar back pain. Onset a couple weeks ago. No known injury. She went to emergency room on 05/13/17 and had lumbosacral films which showed some degenerative changes and no acute findings. She was prescribed prednisone Dosepak and thinks that may have helped her pain somewhat. She called here yesterday requesting MRI of lumbar spine. She's not had any previous back surgery. Denies any radiculitis symptoms. No known injury.  Initially pain was sharp and constant but now only intermittent and with movement. Initially 10 out of 10 severity and now 4-5 out of 10. Denies any burning with urination. She had recent urinalysis through ER which was basically unremarkable. No hematuria.  Past Medical History:  Diagnosis Date  . Arthritis   . Colon polyps   . Diverticulosis   . Headache(784.0)    tension, or allergy headaches  . History of kidney stones   . Hypertension   . Irritable bowel   . Stiffness of joint, shoulder region    left  . Tinnitus    both ears most of the time  . Varicose veins    both legs   Past Surgical History:  Procedure Laterality Date  . ABDOMINAL HYSTERECTOMY  02-09-2010   TAH  . CATARACT EXTRACTION W/PHACO Right 05/17/2015   Procedure: CATARACT EXTRACTION PHACO AND INTRAOCULAR LENS PLACEMENT ([place]);  Surgeon: Rutherford Guys, MD;  Location: [facility];  Service: Ophthalmology;  Laterality: Right;  CDE:6.12  . CATARACT EXTRACTION W/PHACO Left 05/31/2015   Procedure: CATARACT EXTRACTION PHACO AND INTRAOCULAR LENS PLACEMENT ([facility]);  Surgeon: Rutherford Guys, MD;  Location: [facility];  Service: Ophthalmology;  Laterality: Left;  CDE: 6.27  . FOOT SURGERY  many yrs ago   both feet  . TOTAL KNEE ARTHROPLASTY  11/05/2011   Procedure: TOTAL KNEE ARTHROPLASTY;  Surgeon: Gearlean Alf, MD;  Location: [facility];  Service: Orthopedics;  Laterality: Left;   . TOTAL KNEE ARTHROPLASTY Right 02/09/2013   Procedure: RIGHT TOTAL KNEE ARTHROPLASTY;  Surgeon: Gearlean Alf, MD;  Location: [facility];  Service: Orthopedics;  Laterality: Right;    reports that she quit smoking about 25 years ago. Her smoking use included Cigarettes. She has a 18.00 pack-year smoking history. She has never used smokeless tobacco. She reports that she does not drink alcohol or use drugs. family history includes Arthritis in her other; Colon polyps in her mother; Diabetes in her brother; Heart disease in her brother, father, and other; Hypertension in her brother, father, and sister; Stroke in her other. Allergies  Allergen Reactions  . Cefuroxime Axetil      ceftin=hives     Review of Systems  Constitutional: Negative for appetite change, chills, fever and unexpected weight change.  Respiratory: Negative for shortness of breath.   Cardiovascular: Negative for chest pain.  Gastrointestinal: Negative for abdominal pain.  Musculoskeletal: Positive for back pain. Negative for myalgias.       Objective:   Physical Exam  Constitutional: She appears well-developed and well-nourished.  Cardiovascular: Normal rate and regular rhythm.   Pulmonary/Chest: Effort normal and breath sounds normal. No respiratory distress. She has no wheezes. She has no rales.  Musculoskeletal: She exhibits no edema.  Straight leg raise are negative bilaterally. She has no spinal tenderness. No reproducible back tenderness.  Neurological:  Full strength lower extremities. Symmetric reflexes ankle and knee  Assessment:     Right lumbar back pain. Suspect musculoskeletal. Doubt kidney stone. Doubt infectious. Symptoms gradually improving    Plan:     -Continue conservative treatment with heat or ice for symptom relief and Tylenol as needed -Consider physical therapy if not further improved in 1-2 weeks -No clear indication for MRI scan at this time.  Eulas Post MD [place]  [facility] at [facility]

## 2017-05-22 NOTE — Patient Instructions (Signed)
Low Back Sprain  A sprain is a stretch or tear in the bands of tissue that hold bones and joints together (ligaments). Sprains of the lower back (lumbar spine) are a common cause of low back pain. A sprain occurs when ligaments are overextended or stretched beyond their limits. The ligaments can become inflamed, resulting in pain and sudden muscle tightening (spasms). A sprain can be caused by an injury (trauma), or it can develop gradually due to overuse.  There are three types of sprains:  · Grade 1 is a mild sprain involving an overstretched ligament or a very slight tear of the ligament.  · Grade 2 is a moderate sprain involving a partial tear of the ligament.  · Grade 3 is a severe sprain involving a complete tear of the ligament.    What are the causes?  This condition may be caused by:  · Trauma, such as a fall or a hit to the body.  · Twisting or overstretching the back. This may result from doing activities that require a lot of energy, such as lifting heavy objects.    What increases the risk?  The following factors may increase your risk of getting this condition:  · Playing contact sports.  · Participating in sports or activities that put excessive stress on the back and require a lot of bending and twisting, including:  ? Lifting weights or heavy objects.  ? Gymnastics.  ? Soccer.  ? Figure skating.  ? Snowboarding.  · Being overweight or obese.  · Having poor strength and flexibility.    What are the signs or symptoms?  Symptoms of this condition may include:  · Sharp or dull pain in the lower back that does not go away. Pain may extend to the buttocks.  · Stiffness.  · Limited range of motion.  · Inability to stand up straight due to stiffness or pain.  · Muscle spasms.    How is this diagnosed?    This condition may be diagnosed based on:  · Your symptoms.  · Your medical history.  · A physical exam.  ? Your health care provider may push on certain areas of your back to determine the source of your  pain.  ? You may be asked to bend forward, backward, and side to side to assess the severity of your pain and your range of motion.  · Imaging tests, such as:  ? X-rays.  ? MRI.    How is this treated?  Treatment for this condition may include:  · Applying heat and cold to the affected area.  · Medicines to help relieve pain and to relax your muscles (muscle relaxants).  · NSAIDs to help reduce swelling and discomfort.  · Physical therapy.    When your symptoms improve, it is important to gradually return to your normal routine as soon as possible to reduce pain, avoid stiffness, and avoid loss of muscle strength. Generally, symptoms should improve within 6 weeks of treatment. However, recovery time varies.  Follow these instructions at home:  Managing pain, stiffness, and swelling  · If directed, apply ice to the injured area during the first 24 hours after your injury.  ? Put ice in a plastic bag.  ? Place a towel between your skin and the bag.  ? Leave the ice on for 20 minutes, 2-3 times a day.  · If directed, apply heat to the affected area as often as told by your health care provider. Use the   heat source that your health care provider recommends, such as a moist heat pack or a heating pad.  ? Place a towel between your skin and the heat source.  ? Leave the heat on for 20-30 minutes.  ? Remove the heat if your skin turns bright red. This is especially important if you are unable to feel pain, heat, or cold. You may have a greater risk of getting burned.  Activity  · Rest and return to your normal activities as told by your health care provider. Ask your health care provider what activities are safe for you.  · Avoid activities that take a lot of effort (are strenuous) for as long as told by your health care provider.  · Do exercises as told by your health care provider.  General instructions    · Take over-the-counter and prescription medicines only as told by your health care provider.  · If you have  questions or concerns about safety while taking pain medicine, talk with your health care provider.  · Do not drive or operate heavy machinery until you know how your pain medicine affects you.  · Do not use any tobacco products, such as cigarettes, chewing tobacco, and e-cigarettes. Tobacco can delay bone healing. If you need help quitting, ask your health care provider.  · Keep all follow-up visits as told by your health care provider. This is important.  How is this prevented?  · Warm up and stretch before being active.  · Cool down and stretch after being active.  · Give your body time to rest between periods of activity.  · Avoid:  ? Being physically inactive for long periods at a time.  ? Exercising or playing sports when you are tired or in pain.  · Use correct form when playing sports and lifting heavy objects.  · Use good posture when sitting and standing.  · Maintain a healthy weight.  · Sleep on a mattress with medium firmness to support your back.  · Make sure to use equipment that fits you, including shoes that fit well.  · Be safe and responsible while being active to avoid falls.  · Do at least 150 minutes of moderate-intensity exercise each week, such as brisk walking or water aerobics. Try a form of exercise that takes stress off your back, such as swimming or stationary cycling.  · Maintain physical fitness, including:  ? Strength. In particular, develop and maintain strong abdominal muscles.  ? Flexibility.  ? Cardiovascular fitness.  ? Endurance.  Contact a health care provider if:  · Your back pain does not improve after 6 weeks of treatment.  · Your symptoms get worse.  Get help right away if:  · Your back pain is severe.  · You are unable to stand or walk.  · You develop pain in your legs.  · You develop weakness in your buttocks or legs.  · You have difficulty controlling when you urinate or when you have a bowel movement.  This information is not intended to replace advice given to you by  your health care provider. Make sure you discuss any questions you have with your health care provider.  Document Released: 08/20/2005 Document Revised: 04/26/2016 Document Reviewed: 06/01/2015  [employer]vier Interactive Patient Education © 2018 [employer]

## 2017-05-23 ENCOUNTER — Encounter: Payer: Self-pay | Admitting: Family Medicine

## 2017-10-09 DIAGNOSIS — Z961 Presence of intraocular lens: Secondary | ICD-10-CM | POA: Diagnosis not present

## 2017-10-09 DIAGNOSIS — H524 Presbyopia: Secondary | ICD-10-CM | POA: Diagnosis not present

## 2017-10-09 DIAGNOSIS — H5213 Myopia, bilateral: Secondary | ICD-10-CM | POA: Diagnosis not present

## 2017-10-09 DIAGNOSIS — H52203 Unspecified astigmatism, bilateral: Secondary | ICD-10-CM | POA: Diagnosis not present

## 2018-01-16 DIAGNOSIS — Z96651 Presence of right artificial knee joint: Secondary | ICD-10-CM | POA: Diagnosis not present

## 2018-01-16 DIAGNOSIS — Z96652 Presence of left artificial knee joint: Secondary | ICD-10-CM | POA: Diagnosis not present

## 2018-01-24 DIAGNOSIS — M25511 Pain in right shoulder: Secondary | ICD-10-CM | POA: Diagnosis not present

## 2018-01-24 DIAGNOSIS — M19011 Primary osteoarthritis, right shoulder: Secondary | ICD-10-CM | POA: Diagnosis not present

## 2018-02-19 DIAGNOSIS — M19011 Primary osteoarthritis, right shoulder: Secondary | ICD-10-CM | POA: Diagnosis not present

## 2018-06-07 ENCOUNTER — Other Ambulatory Visit: Payer: Self-pay | Admitting: Family Medicine

## 2018-06-09 ENCOUNTER — Other Ambulatory Visit: Payer: Self-pay | Admitting: Family Medicine

## 2018-06-09 DIAGNOSIS — Z1231 Encounter for screening mammogram for malignant neoplasm of breast: Secondary | ICD-10-CM

## 2018-06-11 ENCOUNTER — Ambulatory Visit
Admission: RE | Admit: 2018-06-11 | Discharge: 2018-06-11 | Disposition: A | Discharge status: Home / Self Care | Payer: Medicare HMO | Source: Ambulatory Visit | Attending: Family Medicine | Admitting: Family Medicine

## 2018-06-11 DIAGNOSIS — Z1231 Encounter for screening mammogram for malignant neoplasm of breast: Secondary | ICD-10-CM | POA: Diagnosis not present

## 2018-06-25 ENCOUNTER — Ambulatory Visit (INDEPENDENT_AMBULATORY_CARE_PROVIDER_SITE_OTHER): Payer: Medicare HMO | Admitting: Family Medicine

## 2018-06-25 ENCOUNTER — Other Ambulatory Visit: Payer: Self-pay

## 2018-06-25 ENCOUNTER — Encounter: Payer: Self-pay | Admitting: Family Medicine

## 2018-06-25 VITALS — BP 132/76 | HR 84 | Temp 98.0°F | Ht 62.5 in | Wt 248.8 lb

## 2018-06-25 DIAGNOSIS — Z23 Encounter for immunization: Secondary | ICD-10-CM | POA: Diagnosis not present

## 2018-06-25 DIAGNOSIS — Z01818 Encounter for other preprocedural examination: Secondary | ICD-10-CM | POA: Diagnosis not present

## 2018-06-25 DIAGNOSIS — Z0000 Encounter for general adult medical examination without abnormal findings: Secondary | ICD-10-CM | POA: Diagnosis not present

## 2018-06-25 MED ORDER — AMLODIPINE BESYLATE 5 MG PO TABS: ORAL_TABLET | ORAL | 3 refills | Status: DC

## 2018-06-25 MED ORDER — VALSARTAN-HYDROCHLOROTHIAZIDE 160-25 MG PO TABS: 1.0000 | ORAL_TABLET | Freq: Every day | ORAL | 3 refills | Status: DC

## 2018-06-25 MED ORDER — TRIAMCINOLONE ACETONIDE 0.025 % EX OINT: 1.0000 "application " | TOPICAL_OINTMENT | Freq: Two times a day (BID) | CUTANEOUS | 2 refills | Status: DC | PRN

## 2018-06-25 NOTE — Progress Notes (Signed)
Subjective:     Patient ID: Carla Schmitt, female   DOB: 1946/05/14, 72 y.o.   MRN: [id number]  HPI Patient seen for physical exam.  She has upcoming surgery in November for right shoulder replacement surgery.  No significant cardiac history.  She has history of obesity, hypertension, and mild prediabetes.  She had colonoscopy 2017 with recommended 5-year follow-up.  Tetanus was 2013.  There is notation in chart that she has flu shot 05/04/2018 but patient is adamant that she has not gotten a flu vaccine anywhere including local pharmacies this year.  No history of shingles vaccine.  She had hepatitis C screening couple years ago that was negative.  She is getting regular mammograms and had mammogram just couple weeks ago that was normal.  Past Medical History:  Diagnosis Date  . Arthritis   . Colon polyps   . Diverticulosis   . Headache(784.0)    tension, or allergy headaches  . History of kidney stones   . Hypertension   . Irritable bowel   . Stiffness of joint, shoulder region    left  . Tinnitus    both ears most of the time  . Varicose veins    both legs   Past Surgical History:  Procedure Laterality Date  . ABDOMINAL HYSTERECTOMY  02-09-2010   TAH  . BREAST BIOPSY Left 2018   Fibroadenoma  . CATARACT EXTRACTION W/PHACO Right 05/17/2015   Procedure: CATARACT EXTRACTION PHACO AND INTRAOCULAR LENS PLACEMENT ([place]);  Surgeon: Rutherford Guys, MD;  Location: [facility];  Service: Ophthalmology;  Laterality: Right;  CDE:6.12  . CATARACT EXTRACTION W/PHACO Left 05/31/2015   Procedure: CATARACT EXTRACTION PHACO AND INTRAOCULAR LENS PLACEMENT ([facility]);  Surgeon: Rutherford Guys, MD;  Location: [facility];  Service: Ophthalmology;  Laterality: Left;  CDE: 6.27  . FOOT SURGERY  many yrs ago   both feet  . TOTAL KNEE ARTHROPLASTY  11/05/2011   Procedure: TOTAL KNEE ARTHROPLASTY;  Surgeon: Gearlean Alf, MD;  Location: [facility];  Service: Orthopedics;  Laterality: Left;  . TOTAL KNEE ARTHROPLASTY Right  02/09/2013   Procedure: RIGHT TOTAL KNEE ARTHROPLASTY;  Surgeon: Gearlean Alf, MD;  Location: [facility];  Service: Orthopedics;  Laterality: Right;    reports that she quit smoking about 27 years ago. Her smoking use included cigarettes. She has a 18.00 pack-year smoking history. She has never used smokeless tobacco. She reports that she does not drink alcohol or use drugs. family history includes Arthritis in her other; Colon polyps in her mother; Diabetes in her brother; Heart disease in her brother, father, and other; Hypertension in her brother, father, and sister; Stroke in her other. Allergies  Allergen Reactions  . Cefoxitin Rash  . Cefuroxime Axetil      ceftin=hives     Review of Systems  Constitutional: Negative for fatigue.  Eyes: Negative for visual disturbance.  Respiratory: Negative for cough, chest tightness, shortness of breath and wheezing.   Cardiovascular: Negative for chest pain, palpitations and leg swelling.  Neurological: Negative for dizziness, seizures, syncope, weakness, light-headedness and headaches.       Objective:   Physical Exam  Constitutional: She appears well-developed and well-nourished.  Eyes: Pupils are equal, round, and reactive to light.  Neck: Neck supple. No JVD present. No thyromegaly present.  Cardiovascular: Normal rate and regular rhythm. Exam reveals no gallop.  Pulmonary/Chest: Effort normal and breath sounds normal. No respiratory distress. She has no wheezes. She has no rales.  Abdominal: Soft. Bowel sounds are  normal. She exhibits no mass. There is no tenderness. There is no rebound and no guarding.  Musculoskeletal: She exhibits no edema.  Neurological: She is alert.  Skin: No rash noted.       Assessment:     Physical exam.  Health maintenance issues were addressed as below    Plan:     -Flu vaccine given -Pneumonia vaccines up-to-date -Discussed Shingrix vaccine and she will check on insurance coverage if  interested -Check EKG with upcoming surgery. Indication for EKG-presurgical clearance.  NSR with no acute changes  No significant change c/w prior tracing 05/10/15. - She will be getting pre-op labs per ortho.  Eulas Post MD [facility] at [facility]

## 2018-06-25 NOTE — Patient Instructions (Signed)
Consider shingles vaccine (Shingrix) and check on insurance coverage if interested.   

## 2018-07-09 ENCOUNTER — Ambulatory Visit: Payer: Medicare HMO

## 2018-07-16 NOTE — Pre-Procedure Instructions (Signed)
Carla Schmitt  07/16/2018      [id number] - [place], [address] [place] [address] Phone: [phone] Fax: [phone]    Your procedure is scheduled on November 21st.  Report to [place] at 1045 A.M.  Call this number if you have problems the morning of surgery:  [phone]   Remember:  Do not eat or drink after midnight.    Take these medicines the morning of surgery with A SIP OF WATER   None  7 days prior to surgery STOP taking any Aspirin(unless otherwise instructed by your surgeon), Aleve, Naproxen, Ibuprofen, Motrin, Advil, Goody's, BC's, all herbal medications, fish oil, and all vitamins     Do not wear jewelry, make-up or nail polish.  Do not wear lotions, powders, or perfumes, or deodorant.  Do not shave 48 hours prior to surgery.  Men may shave face and neck.  Do not bring valuables to the hospital.  [facility] is not responsible for any belongings or valuables.  Contacts, dentures or bridgework may not be worn into surgery.  Leave your suitcase in the car.  After surgery it may be brought to your room.  For patients admitted to the hospital, discharge time will be determined by your treatment team.  Patients discharged the day of surgery will not be allowed to drive home.    [facility]- Preparing For Surgery  Before surgery, you can play an important role. Because skin is not sterile, your skin needs to be as free of germs as possible. You can reduce the number of germs on your skin by washing with CHG (chlorahexidine gluconate) Soap before surgery.  CHG is an antiseptic cleaner which kills germs and bonds with the skin to continue killing germs even after washing.    Oral Hygiene is also important to reduce your risk of infection.  Remember - BRUSH YOUR TEETH THE MORNING OF SURGERY WITH YOUR REGULAR TOOTHPASTE  Please do not use if you  have an allergy to CHG or antibacterial soaps. If your skin becomes reddened/irritated stop using the CHG.  Do not shave (including legs and underarms) for at least 48 hours prior to first CHG shower. It is OK to shave your face.  Please follow these instructions carefully.   1. Shower the NIGHT BEFORE SURGERY and the MORNING OF SURGERY with CHG.   2. If you chose to wash your hair, wash your hair first as usual with your normal shampoo.  3. After you shampoo, rinse your hair and body thoroughly to remove the shampoo.  4. Use CHG as you would any other liquid soap. You can apply CHG directly to the skin and wash gently with a scrungie or a clean washcloth.   5. Apply the CHG Soap to your body ONLY FROM THE NECK DOWN.  Do not use on open wounds or open sores. Avoid contact with your eyes, ears, mouth and genitals (private parts). Wash Face and genitals (private parts)  with your normal soap.  6. Wash thoroughly, paying special attention to the area where your surgery will be performed.  7. Thoroughly rinse your body with warm water from the neck down.  8. DO NOT shower/wash with your normal soap after using and rinsing off the CHG Soap.  9. Pat yourself dry with a CLEAN TOWEL.  10. Wear CLEAN PAJAMAS to bed the  night before surgery, wear comfortable clothes the morning of surgery  11. Place CLEAN SHEETS on your bed the night of your first shower and DO NOT SLEEP WITH PETS.    Day of Surgery:  Do not apply any deodorants/lotions.  Please wear clean clothes to the hospital/surgery center.   Remember to brush your teeth WITH YOUR REGULAR TOOTHPASTE.    Please read over the following fact sheets that you were given.

## 2018-07-17 ENCOUNTER — Encounter (HOSPITAL_COMMUNITY)
Admission: RE | Admit: 2018-07-17 | Discharge: 2018-07-17 | Disposition: A | Discharge status: Home / Self Care | Payer: Medicare HMO | Source: Ambulatory Visit | Attending: Orthopedic Surgery | Admitting: Orthopedic Surgery

## 2018-07-17 ENCOUNTER — Other Ambulatory Visit: Payer: Self-pay

## 2018-07-17 ENCOUNTER — Encounter (HOSPITAL_COMMUNITY): Payer: Self-pay

## 2018-07-17 DIAGNOSIS — Z01812 Encounter for preprocedural laboratory examination: Secondary | ICD-10-CM | POA: Insufficient documentation

## 2018-07-17 HISTORY — DX: Anemia, unspecified: D64.9

## 2018-07-17 HISTORY — DX: Nausea with vomiting, unspecified: R11.2

## 2018-07-17 HISTORY — DX: Other specified postprocedural states: Z98.890

## 2018-07-17 LAB — BASIC METABOLIC PANEL
Anion gap: 9 (ref 5–15)
BUN: 18 mg/dL (ref 8–23)
CHLORIDE: 104 mmol/L (ref 98–111)
CO2: 28 mmol/L (ref 22–32)
Calcium: 10.5 mg/dL — ABNORMAL HIGH (ref 8.9–10.3)
Creatinine, Ser: 0.95 mg/dL (ref 0.44–1.00)
GFR calc Af Amer: >60 mL/min (ref >60)
GFR calc non Af Amer: 59 mL/min — ABNORMAL LOW (ref >60)
Glucose, Bld: 98 mg/dL (ref 70–99)
Potassium: 3.3 mmol/L — ABNORMAL LOW (ref 3.5–5.1)
SODIUM: 141 mmol/L (ref 135–145)

## 2018-07-17 LAB — CBC
HCT: 38.7 % (ref 36.0–46.0)
HEMOGLOBIN: 12.3 g/dL (ref 12.0–15.0)
MCH: 27.3 pg (ref 26.0–34.0)
MCHC: 31.8 g/dL (ref 30.0–36.0)
MCV: 85.8 fL (ref 80.0–100.0)
PLATELETS: 271 10*3/uL (ref 150–400)
RBC: 4.51 MIL/uL (ref 3.87–5.11)
RDW: 15.1 % (ref 11.5–15.5)
WBC: 5.7 10*3/uL (ref 4.0–10.5)
nRBC: 0 % (ref 0.0–0.2)

## 2018-07-17 LAB — SURGICAL PCR SCREEN
MRSA, PCR: NEGATIVE
STAPHYLOCOCCUS AUREUS: NEGATIVE

## 2018-07-17 NOTE — Pre-Procedure Instructions (Signed)
Carla Schmitt  07/17/2018      [id number] - [place], [address] [place] [address] Phone: [phone] Fax: [phone]    Your procedure is scheduled on Thursday,  November 21st.   Report to [place] at 1045 A.M.             (posted surgery time 12:44p - 2:44p)   Call this number if you have problems the morning of surgery:  [phone]   Remember:  Do not eat or drink after midnight.    Take these medicines the morning of surgery with A SIP OF WATER   None  7 days prior to surgery STOP taking any Aspirin(unless otherwise instructed by your surgeon), Aleve, Naproxen, Ibuprofen, Motrin, Advil, Goody's, BC's, all herbal medications, fish oil, and all vitamins     Do not wear jewelry, make-up or nail polish.  Do not wear lotions, powders, or perfumes, or deodorant.  Do not shave 48 hours prior to surgery.  Men may shave face and neck.  Do not bring valuables to the hospital.  [facility] is not responsible for any belongings or valuables.  Contacts, dentures or bridgework may not be worn into surgery.  Leave your suitcase in the car.  After surgery it may be brought to your room.  For patients admitted to the hospital, discharge time will be determined by your treatment team.     [facility]- Preparing For Surgery  Before surgery, you can play an important role. Because skin is not sterile, your skin needs to be as free of germs as possible. You can reduce the number of germs on your skin by washing with CHG (chlorahexidine gluconate) Soap before surgery.  CHG is an antiseptic cleaner which kills germs and bonds with the skin to continue killing germs even after washing.    Oral Hygiene is also important to reduce your risk of infection.    Remember - BRUSH YOUR TEETH THE MORNING OF SURGERY WITH YOUR REGULAR TOOTHPASTE  Please do not use if you have  an allergy to CHG or antibacterial soaps. If your skin becomes reddened/irritated stop using the CHG.  Do not shave (including legs and underarms) for at least 48 hours prior to first CHG shower. It is OK to shave your face.  Please follow these instructions carefully.   1. Shower the NIGHT BEFORE SURGERY and the MORNING OF SURGERY with CHG.   2. If you chose to wash your hair, wash your hair first as usual with your normal shampoo.  3. After you shampoo, rinse your hair and body thoroughly to remove the shampoo.  4. Use CHG as you would any other liquid soap. You can apply CHG directly to the skin and wash gently with a scrungie or a clean washcloth.   5. Apply the CHG Soap to your body ONLY FROM THE NECK DOWN.  Do not use on open wounds or open sores. Avoid contact with your eyes, ears, mouth and genitals (private parts). Wash Face and genitals (private parts)  with your normal soap.  6. Wash thoroughly, paying special attention to the area where your surgery will be performed.  7. Thoroughly rinse your body with warm water from the neck down.  8. DO NOT shower/wash with your normal soap after using and rinsing off the CHG Soap.  9. Pat yourself dry with  a CLEAN TOWEL.  10. Wear CLEAN PAJAMAS to bed the night before surgery, wear comfortable clothes the morning of surgery  11. Place CLEAN SHEETS on your bed the night of your first shower and DO NOT SLEEP WITH PETS.  Day of Surgery:  Do not apply any deodorants/lotions.  Please wear clean clothes to the hospital/surgery center.   Remember to brush your teeth WITH YOUR REGULAR TOOTHPASTE.   Please read over the following fact sheets that you were given.

## 2018-07-17 NOTE — Progress Notes (Signed)
PCP is Dr. Leonia Reeves  LOV 06/2018  Also has clearance note. Denies murmur, cp, sob. Denies any cardiac testing done

## 2018-07-23 MED ORDER — TRANEXAMIC ACID-NACL 1000-0.7 MG/100ML-% IV SOLN
1000.0000 mg | INTRAVENOUS | Status: AC
  Administered 2018-07-24: 1000 mg via INTRAVENOUS
  Filled 2018-07-23 (×2): qty 100

## 2018-07-24 ENCOUNTER — Inpatient Hospital Stay (HOSPITAL_COMMUNITY): Payer: Medicare HMO | Admitting: Anesthesiology

## 2018-07-24 ENCOUNTER — Encounter (HOSPITAL_COMMUNITY)
Admission: RE | Disposition: A | Discharge status: Home / Self Care | Payer: Self-pay | Source: Ambulatory Visit | Attending: Orthopedic Surgery

## 2018-07-24 ENCOUNTER — Inpatient Hospital Stay (HOSPITAL_COMMUNITY)
Admission: RE | Admit: 2018-07-24 | Discharge: 2018-07-25 | DRG: 483 | Disposition: A | Discharge status: Home / Self Care | Payer: Medicare HMO | Source: Ambulatory Visit | Attending: Orthopedic Surgery | Admitting: Orthopedic Surgery

## 2018-07-24 ENCOUNTER — Encounter (HOSPITAL_COMMUNITY): Payer: Self-pay | Admitting: Urology

## 2018-07-24 DIAGNOSIS — Z7982 Long term (current) use of aspirin: Secondary | ICD-10-CM | POA: Diagnosis not present

## 2018-07-24 DIAGNOSIS — Z87891 Personal history of nicotine dependence: Secondary | ICD-10-CM | POA: Diagnosis not present

## 2018-07-24 DIAGNOSIS — Z881 Allergy status to other antibiotic agents status: Secondary | ICD-10-CM | POA: Diagnosis not present

## 2018-07-24 DIAGNOSIS — Z96611 Presence of right artificial shoulder joint: Secondary | ICD-10-CM

## 2018-07-24 DIAGNOSIS — Z79899 Other long term (current) drug therapy: Secondary | ICD-10-CM

## 2018-07-24 DIAGNOSIS — Z6841 Body Mass Index (BMI) 40.0 and over, adult: Secondary | ICD-10-CM

## 2018-07-24 DIAGNOSIS — I10 Essential (primary) hypertension: Secondary | ICD-10-CM | POA: Diagnosis not present

## 2018-07-24 DIAGNOSIS — Z96653 Presence of artificial knee joint, bilateral: Secondary | ICD-10-CM | POA: Diagnosis present

## 2018-07-24 DIAGNOSIS — M25511 Pain in right shoulder: Secondary | ICD-10-CM | POA: Diagnosis not present

## 2018-07-24 DIAGNOSIS — M19011 Primary osteoarthritis, right shoulder: Principal | ICD-10-CM | POA: Diagnosis present

## 2018-07-24 DIAGNOSIS — G8918 Other acute postprocedural pain: Secondary | ICD-10-CM | POA: Diagnosis not present

## 2018-07-24 HISTORY — PX: TOTAL SHOULDER ARTHROPLASTY: SHX126

## 2018-07-24 SURGERY — ARTHROPLASTY, SHOULDER, TOTAL
Anesthesia: Regional | Site: Shoulder | Laterality: Right

## 2018-07-24 MED ORDER — MENTHOL 3 MG MT LOZG: 1.0000 | LOZENGE | OROMUCOSAL | Status: DC | PRN

## 2018-07-24 MED ORDER — POLYETHYLENE GLYCOL 3350 17 G PO PACK: 17.0000 g | PACK | Freq: Every day | ORAL | Status: DC | PRN

## 2018-07-24 MED ORDER — AMLODIPINE BESYLATE 5 MG PO TABS
5.0000 mg | ORAL_TABLET | Freq: Every day | ORAL | Status: DC
  Administered 2018-07-24: 5 mg via ORAL
  Filled 2018-07-24: qty 1

## 2018-07-24 MED ORDER — PROMETHAZINE HCL 25 MG/ML IJ SOLN: 6.2500 mg | INTRAMUSCULAR | Status: DC | PRN

## 2018-07-24 MED ORDER — KETOROLAC TROMETHAMINE 15 MG/ML IJ SOLN
7.5000 mg | Freq: Four times a day (QID) | INTRAMUSCULAR | Status: DC
  Administered 2018-07-24 – 2018-07-25 (×3): 7.5 mg via INTRAVENOUS
  Filled 2018-07-24 (×3): qty 1

## 2018-07-24 MED ORDER — BUPIVACAINE HCL (PF) 0.5 % IJ SOLN
INTRAMUSCULAR | Status: DC | PRN
  Administered 2018-07-24: 20 mL via PERINEURAL

## 2018-07-24 MED ORDER — OXYCODONE HCL 5 MG PO TABS: 5.0000 mg | ORAL_TABLET | Freq: Once | ORAL | Status: DC | PRN

## 2018-07-24 MED ORDER — LACTATED RINGERS IV SOLN
INTRAVENOUS | Status: DC
  Administered 2018-07-24: 11:00:00 via INTRAVENOUS

## 2018-07-24 MED ORDER — MIDAZOLAM HCL 2 MG/2ML IJ SOLN
1.0000 mg | Freq: Once | INTRAMUSCULAR | Status: AC
  Administered 2018-07-24: 1 mg via INTRAVENOUS

## 2018-07-24 MED ORDER — METOCLOPRAMIDE HCL 5 MG PO TABS: 5.0000 mg | ORAL_TABLET | Freq: Three times a day (TID) | ORAL | Status: DC | PRN

## 2018-07-24 MED ORDER — HYDROMORPHONE HCL 1 MG/ML IJ SOLN: 0.2500 mg | INTRAMUSCULAR | Status: DC | PRN

## 2018-07-24 MED ORDER — DIPHENHYDRAMINE HCL 12.5 MG/5ML PO ELIX: 12.5000 mg | ORAL_SOLUTION | ORAL | Status: DC | PRN

## 2018-07-24 MED ORDER — ASPIRIN EC 81 MG PO TBEC
81.0000 mg | DELAYED_RELEASE_TABLET | Freq: Every day | ORAL | Status: DC
  Administered 2018-07-24: 81 mg via ORAL
  Filled 2018-07-24: qty 1

## 2018-07-24 MED ORDER — IRBESARTAN 150 MG PO TABS
150.0000 mg | ORAL_TABLET | Freq: Every day | ORAL | Status: DC
  Administered 2018-07-24 – 2018-07-25 (×2): 150 mg via ORAL
  Filled 2018-07-24 (×2): qty 1

## 2018-07-24 MED ORDER — BUPIVACAINE LIPOSOME 1.3 % IJ SUSP
INTRAMUSCULAR | Status: DC | PRN
  Administered 2018-07-24: 10 mL via PERINEURAL

## 2018-07-24 MED ORDER — DOCUSATE SODIUM 100 MG PO CAPS
100.0000 mg | ORAL_CAPSULE | Freq: Two times a day (BID) | ORAL | Status: DC
  Administered 2018-07-24 – 2018-07-25 (×2): 100 mg via ORAL
  Filled 2018-07-24 (×2): qty 1

## 2018-07-24 MED ORDER — CHLORHEXIDINE GLUCONATE 4 % EX LIQD: 60.0000 mL | Freq: Once | CUTANEOUS | Status: DC

## 2018-07-24 MED ORDER — FENTANYL CITRATE (PF) 100 MCG/2ML IJ SOLN
50.0000 ug | Freq: Once | INTRAMUSCULAR | Status: AC
  Administered 2018-07-24: 50 ug via INTRAVENOUS

## 2018-07-24 MED ORDER — BISACODYL 5 MG PO TBEC: 5.0000 mg | DELAYED_RELEASE_TABLET | Freq: Every day | ORAL | Status: DC | PRN

## 2018-07-24 MED ORDER — VALSARTAN-HYDROCHLOROTHIAZIDE 160-25 MG PO TABS: 1.0000 | ORAL_TABLET | Freq: Every day | ORAL | Status: DC

## 2018-07-24 MED ORDER — METOCLOPRAMIDE HCL 5 MG/ML IJ SOLN: 5.0000 mg | Freq: Three times a day (TID) | INTRAMUSCULAR | Status: DC | PRN

## 2018-07-24 MED ORDER — MIDAZOLAM HCL 2 MG/2ML IJ SOLN
INTRAMUSCULAR | Status: AC
  Administered 2018-07-24: 1 mg via INTRAVENOUS
  Filled 2018-07-24: qty 2

## 2018-07-24 MED ORDER — ONDANSETRON HCL 4 MG/2ML IJ SOLN: 4.0000 mg | Freq: Four times a day (QID) | INTRAMUSCULAR | Status: DC | PRN

## 2018-07-24 MED ORDER — FENTANYL CITRATE (PF) 100 MCG/2ML IJ SOLN
INTRAMUSCULAR | Status: AC
  Administered 2018-07-24: 50 ug via INTRAVENOUS
  Filled 2018-07-24: qty 2

## 2018-07-24 MED ORDER — SUGAMMADEX SODIUM 200 MG/2ML IV SOLN
INTRAVENOUS | Status: AC
  Filled 2018-07-24: qty 2

## 2018-07-24 MED ORDER — PROPOFOL 10 MG/ML IV BOLUS
INTRAVENOUS | Status: DC | PRN
  Administered 2018-07-24: 30 mg via INTRAVENOUS
  Administered 2018-07-24: 130 mg via INTRAVENOUS

## 2018-07-24 MED ORDER — LIDOCAINE 2% (20 MG/ML) 5 ML SYRINGE
INTRAMUSCULAR | Status: AC
  Filled 2018-07-24: qty 5

## 2018-07-24 MED ORDER — ALUM & MAG HYDROXIDE-SIMETH 200-200-20 MG/5ML PO SUSP: 30.0000 mL | ORAL | Status: DC | PRN

## 2018-07-24 MED ORDER — 0.9 % SODIUM CHLORIDE (POUR BTL) OPTIME
TOPICAL | Status: DC | PRN
  Administered 2018-07-24: 1000 mL

## 2018-07-24 MED ORDER — DEXAMETHASONE SODIUM PHOSPHATE 10 MG/ML IJ SOLN
INTRAMUSCULAR | Status: DC | PRN
  Administered 2018-07-24: 10 mg via INTRAVENOUS

## 2018-07-24 MED ORDER — METHOCARBAMOL 500 MG PO TABS: 500.0000 mg | ORAL_TABLET | Freq: Four times a day (QID) | ORAL | Status: DC | PRN

## 2018-07-24 MED ORDER — FENTANYL CITRATE (PF) 250 MCG/5ML IJ SOLN
INTRAMUSCULAR | Status: AC
  Filled 2018-07-24: qty 5

## 2018-07-24 MED ORDER — TRAMADOL HCL 50 MG PO TABS: 50.0000 mg | ORAL_TABLET | Freq: Four times a day (QID) | ORAL | Status: DC | PRN

## 2018-07-24 MED ORDER — EPHEDRINE SULFATE-NACL 50-0.9 MG/10ML-% IV SOSY
PREFILLED_SYRINGE | INTRAVENOUS | Status: DC | PRN
  Administered 2018-07-24: 10 mg via INTRAVENOUS
  Administered 2018-07-24: 5 mg via INTRAVENOUS

## 2018-07-24 MED ORDER — SUGAMMADEX SODIUM 200 MG/2ML IV SOLN
INTRAVENOUS | Status: DC | PRN
  Administered 2018-07-24: 200 mg via INTRAVENOUS

## 2018-07-24 MED ORDER — HYDROCHLOROTHIAZIDE 25 MG PO TABS
25.0000 mg | ORAL_TABLET | Freq: Every day | ORAL | Status: DC
  Administered 2018-07-24: 25 mg via ORAL
  Filled 2018-07-24 (×2): qty 1

## 2018-07-24 MED ORDER — ONDANSETRON HCL 4 MG PO TABS: 4.0000 mg | ORAL_TABLET | Freq: Four times a day (QID) | ORAL | Status: DC | PRN

## 2018-07-24 MED ORDER — HYDROMORPHONE HCL 1 MG/ML IJ SOLN: 0.5000 mg | INTRAMUSCULAR | Status: DC | PRN

## 2018-07-24 MED ORDER — METHOCARBAMOL 1000 MG/10ML IJ SOLN
500.0000 mg | Freq: Four times a day (QID) | INTRAVENOUS | Status: DC | PRN
  Filled 2018-07-24: qty 5

## 2018-07-24 MED ORDER — ROCURONIUM BROMIDE 50 MG/5ML IV SOSY
PREFILLED_SYRINGE | INTRAVENOUS | Status: DC | PRN
  Administered 2018-07-24: 50 mg via INTRAVENOUS

## 2018-07-24 MED ORDER — OXYCODONE HCL 5 MG/5ML PO SOLN: 5.0000 mg | Freq: Once | ORAL | Status: DC | PRN

## 2018-07-24 MED ORDER — OXYCODONE HCL 5 MG PO TABS
5.0000 mg | ORAL_TABLET | ORAL | Status: DC | PRN
  Administered 2018-07-25: 5 mg via ORAL
  Filled 2018-07-24: qty 1

## 2018-07-24 MED ORDER — ONDANSETRON HCL 4 MG/2ML IJ SOLN
INTRAMUSCULAR | Status: AC
  Filled 2018-07-24: qty 2

## 2018-07-24 MED ORDER — OXYCODONE HCL 5 MG PO TABS: 10.0000 mg | ORAL_TABLET | ORAL | Status: DC | PRN

## 2018-07-24 MED ORDER — PHENOL 1.4 % MT LIQD: 1.0000 | OROMUCOSAL | Status: DC | PRN

## 2018-07-24 MED ORDER — SODIUM CHLORIDE 0.9 % IV SOLN
INTRAVENOUS | Status: DC | PRN
  Administered 2018-07-24: 20 ug/min via INTRAVENOUS

## 2018-07-24 MED ORDER — VANCOMYCIN HCL IN DEXTROSE 1-5 GM/200ML-% IV SOLN
1000.0000 mg | INTRAVENOUS | Status: AC
  Administered 2018-07-24: 1000 mg via INTRAVENOUS
  Filled 2018-07-24: qty 200

## 2018-07-24 MED ORDER — LIDOCAINE 2% (20 MG/ML) 5 ML SYRINGE
INTRAMUSCULAR | Status: DC | PRN
  Administered 2018-07-24: 60 mg via INTRAVENOUS

## 2018-07-24 MED ORDER — ONDANSETRON HCL 4 MG/2ML IJ SOLN
INTRAMUSCULAR | Status: DC | PRN
  Administered 2018-07-24: 4 mg via INTRAVENOUS

## 2018-07-24 MED ORDER — FLEET ENEMA 7-19 GM/118ML RE ENEM: 1.0000 | ENEMA | Freq: Once | RECTAL | Status: DC | PRN

## 2018-07-24 MED ORDER — PROPOFOL 10 MG/ML IV BOLUS
INTRAVENOUS | Status: AC
  Filled 2018-07-24: qty 20

## 2018-07-24 MED ORDER — LACTATED RINGERS IV SOLN
INTRAVENOUS | Status: DC
  Administered 2018-07-24 – 2018-07-25 (×2): via INTRAVENOUS

## 2018-07-24 MED ORDER — ACETAMINOPHEN 325 MG PO TABS: 325.0000 mg | ORAL_TABLET | Freq: Four times a day (QID) | ORAL | Status: DC | PRN

## 2018-07-24 MED ORDER — ROCURONIUM BROMIDE 50 MG/5ML IV SOSY
PREFILLED_SYRINGE | INTRAVENOUS | Status: AC
  Filled 2018-07-24: qty 5

## 2018-07-24 SURGICAL SUPPLY — 68 items
ADH SKN CLS APL DERMABOND .7 (GAUZE/BANDAGES/DRESSINGS) ×1
ADH SKN CLS LQ APL DERMABOND (GAUZE/BANDAGES/DRESSINGS) ×1
AID PSTN UNV HD RSTRNT DISP (MISCELLANEOUS) ×1
BIT DRILL 5/64X5 DISP (BIT) ×2 IMPLANT
BLADE SAW SGTL 83.5X18.5 (BLADE) ×2 IMPLANT
CEMENT BONE DEPUY (Cement) ×2 IMPLANT
COVER SURGICAL LIGHT HANDLE (MISCELLANEOUS) ×2 IMPLANT
COVER WAND RF STERILE (DRAPES) ×2 IMPLANT
DERMABOND ADHESIVE PROPEN (GAUZE/BANDAGES/DRESSINGS) ×1
DERMABOND ADVANCED (GAUZE/BANDAGES/DRESSINGS) ×1
DERMABOND ADVANCED .7 DNX12 (GAUZE/BANDAGES/DRESSINGS) IMPLANT
DERMABOND ADVANCED .7 DNX6 (GAUZE/BANDAGES/DRESSINGS) ×1 IMPLANT
DRAPE ORTHO SPLIT 77X108 STRL (DRAPES) ×4
DRAPE SURG 17X11 SM STRL (DRAPES) ×2 IMPLANT
DRAPE SURG ORHT 6 SPLT 77X108 (DRAPES) ×2 IMPLANT
DRAPE U-SHAPE 47X51 STRL (DRAPES) ×2 IMPLANT
DRSG AQUACEL AG ADV 3.5X10 (GAUZE/BANDAGES/DRESSINGS) ×2 IMPLANT
DURAPREP 26ML APPLICATOR (WOUND CARE) ×2 IMPLANT
ELECT BLADE 4.0 EZ CLEAN MEGAD (MISCELLANEOUS) ×2
ELECT CAUTERY BLADE 6.4 (BLADE) ×2 IMPLANT
ELECT REM PT RETURN 9FT ADLT (ELECTROSURGICAL) ×2
ELECTRODE BLDE 4.0 EZ CLN MEGD (MISCELLANEOUS) ×1 IMPLANT
ELECTRODE REM PT RTRN 9FT ADLT (ELECTROSURGICAL) ×1 IMPLANT
FACESHIELD WRAPAROUND (MASK) ×6 IMPLANT
FACESHIELD WRAPAROUND OR TEAM (MASK) ×3 IMPLANT
GLENOID WITH CLEAT SM (Miscellaneous) ×1 IMPLANT
GLOVE BIO SURGEON STRL SZ7.5 (GLOVE) ×2 IMPLANT
GLOVE BIO SURGEON STRL SZ8 (GLOVE) ×2 IMPLANT
GLOVE EUDERMIC 7 POWDERFREE (GLOVE) ×2 IMPLANT
GLOVE SS BIOGEL STRL SZ 7.5 (GLOVE) ×1 IMPLANT
GLOVE SUPERSENSE BIOGEL SZ 7.5 (GLOVE) ×1
GOWN STRL REUS W/ TWL LRG LVL3 (GOWN DISPOSABLE) ×1 IMPLANT
GOWN STRL REUS W/ TWL XL LVL3 (GOWN DISPOSABLE) ×2 IMPLANT
GOWN STRL REUS W/TWL LRG LVL3 (GOWN DISPOSABLE) ×2
GOWN STRL REUS W/TWL XL LVL3 (GOWN DISPOSABLE) ×4
HEAD HUMERAL USP II 44/17 (Head) ×1 IMPLANT
KIT BASIN OR (CUSTOM PROCEDURE TRAY) ×2 IMPLANT
KIT SET UNIVERSAL (KITS) ×1 IMPLANT
KIT TURNOVER KIT B (KITS) ×2 IMPLANT
MANIFOLD NEPTUNE II (INSTRUMENTS) ×2 IMPLANT
NDL TAPERED W/ NITINOL LOOP (MISCELLANEOUS) ×1 IMPLANT
NEEDLE TAPERED W/ NITINOL LOOP (MISCELLANEOUS) ×2 IMPLANT
NS IRRIG 1000ML POUR BTL (IV SOLUTION) ×2 IMPLANT
PACK SHOULDER (CUSTOM PROCEDURE TRAY) ×2 IMPLANT
PAD ARMBOARD 7.5X6 YLW CONV (MISCELLANEOUS) ×4 IMPLANT
RESTRAINT HEAD UNIVERSAL NS (MISCELLANEOUS) ×2 IMPLANT
SLING ARM FOAM STRAP LRG (SOFTGOODS) IMPLANT
SLING ARM IMMOBILIZER LRG (SOFTGOODS) ×1 IMPLANT
SLING ARM IMMOBILIZER MED (SOFTGOODS) IMPLANT
SLING ARM XL FOAM STRAP (SOFTGOODS) ×2 IMPLANT
SMARTMIX MINI TOWER (MISCELLANEOUS) ×2
SPONGE LAP 18X18 X RAY DECT (DISPOSABLE) ×2 IMPLANT
SPONGE LAP 4X18 RFD (DISPOSABLE) ×2 IMPLANT
STEM HUMERAL APEX UNI 9MM (Stem) ×1 IMPLANT
SUCTION FRAZIER HANDLE 10FR (MISCELLANEOUS) ×1
SUCTION TUBE FRAZIER 10FR DISP (MISCELLANEOUS) ×1 IMPLANT
SUT FIBERWIRE #2 38 T-5 BLUE (SUTURE) ×2
SUT MNCRL AB 3-0 PS2 18 (SUTURE) ×2 IMPLANT
SUT MON AB 2-0 CT1 36 (SUTURE) ×2 IMPLANT
SUT VIC AB 1 CT1 27 (SUTURE) ×6
SUT VIC AB 1 CT1 27XBRD ANBCTR (SUTURE) ×3 IMPLANT
SUTURE FIBERWR #2 38 T-5 BLUE (SUTURE) ×1 IMPLANT
SUTURE TAPE 1.3 40 TPR END (SUTURE) ×3 IMPLANT
SUTURETAPE 1.3 40 TPR END (SUTURE) ×6
SYR CONTROL 10ML LL (SYRINGE) IMPLANT
TOWEL OR 17X26 10 PK STRL BLUE (TOWEL DISPOSABLE) ×2 IMPLANT
TOWER SMARTMIX MINI (MISCELLANEOUS) ×1 IMPLANT
WATER STERILE IRR 1000ML POUR (IV SOLUTION) ×2 IMPLANT

## 2018-07-24 NOTE — Plan of Care (Signed)

## 2018-07-24 NOTE — H&P (Signed)
Carla Schmitt    Chief Complaint: right shoulder osteoarthritis HPI: The patient is a 72 y.o. female  With end stage left shoulder OA  Past Medical History:  Diagnosis Date  . Anemia    SOME HER KNEE SURGERY  . Arthritis   . Colon polyps   . Diverticulosis   . History of kidney stones   . Hypertension   . Irritable bowel   . PONV (postoperative nausea and vomiting)    WITH 2ND KNEE SURGERY, HAD SOME N/V  . Stiffness of joint, shoulder region    left  . Tinnitus    both ears most of the time  . Varicose veins    both legs    Past Surgical History:  Procedure Laterality Date  . ABDOMINAL HYSTERECTOMY  02-09-2010   TAH  . BREAST BIOPSY Left 2018   Fibroadenoma  . CATARACT EXTRACTION W/PHACO Right 05/17/2015   Procedure: CATARACT EXTRACTION PHACO AND INTRAOCULAR LENS PLACEMENT ([place]);  Surgeon: Rutherford Guys, MD;  Location: [facility];  Service: Ophthalmology;  Laterality: Right;  CDE:6.12  . CATARACT EXTRACTION W/PHACO Left 05/31/2015   Procedure: CATARACT EXTRACTION PHACO AND INTRAOCULAR LENS PLACEMENT ([facility]);  Surgeon: Rutherford Guys, MD;  Location: [facility];  Service: Ophthalmology;  Laterality: Left;  CDE: 6.27  . EYE SURGERY    . FOOT SURGERY  many yrs ago   both feet  . JOINT REPLACEMENT    . TOTAL KNEE ARTHROPLASTY  11/05/2011   Procedure: TOTAL KNEE ARTHROPLASTY;  Surgeon: Gearlean Alf, MD;  Location: [facility];  Service: Orthopedics;  Laterality: Left;  . TOTAL KNEE ARTHROPLASTY Right 02/09/2013   Procedure: RIGHT TOTAL KNEE ARTHROPLASTY;  Surgeon: Gearlean Alf, MD;  Location: [facility];  Service: Orthopedics;  Laterality: Right;    Family History  Problem Relation Age of Onset  . Arthritis Other   . Heart disease Other   . Stroke Other   . Heart disease Father   . Hypertension Father   . Hypertension Sister   . Diabetes Brother   . Heart disease Brother   . Hypertension Brother   . Colon polyps Mother   . Colon cancer Neg Hx     Social History:  reports that she quit  smoking about 27 years ago. Her smoking use included cigarettes. She has a 18.00 pack-year smoking history. She has never used smokeless tobacco. She reports that she does not drink alcohol or use drugs.   Medications Prior to Admission  Medication Sig Dispense Refill  . amLODipine (NORVASC) 5 MG tablet TAKE 1 TABLET(5 MG) BY MOUTH AT BEDTIME (Patient taking differently: Take 5 mg by mouth at bedtime. ) 90 tablet 3  . aspirin 81 MG tablet Take 81 mg by mouth at bedtime.     . calcium-vitamin D (OSCAL WITH D) 250-125 MG-UNIT tablet Take 2 tablets by mouth 2 (two) times daily.    Marland Kitchen triamcinolone (KENALOG) 0.025 % ointment Apply 1 application topically 2 (two) times daily as needed. (Patient taking differently: Apply 1 application topically 2 (two) times daily as needed (rash). ) 45 g 2  . valsartan-hydrochlorothiazide (DIOVAN HCT) 160-25 MG tablet Take 1 tablet by mouth daily. 90 tablet 3     Physical Exam: left shoulder with painful and restricted motion as noted at recent office visits  Vitals  Temp:  [98.1 F (36.7 C)] 98.1 F (36.7 C) (11/21 1044) Pulse Rate:  [72-85] 72 (11/21 1150) Resp:  [14-18] 14 (11/21 1150) BP: (167-169)/(67-74) 169/74 (11/21  1150) SpO2:  [100 %] 100 % (11/21 1150) Weight:  [112.7 kg] 112.7 kg (11/21 1044)  Assessment/Plan  Impression: right shoulder osteoarthritis  Plan of Action: Procedure(s): RIGHT TOTAL SHOULDER ARTHROPLASTY  Lana Flaim M Lanny Donoso 07/24/2018, 12:40 PM Contact # [phone]

## 2018-07-24 NOTE — Transfer of Care (Signed)
Immediate Anesthesia Transfer of Care Note  Patient: Carla Schmitt  Procedure(s) Performed: RIGHT TOTAL SHOULDER ARTHROPLASTY (Right Shoulder)  Patient Location: PACU  Anesthesia Type:GA combined with regional for post-op pain  Level of Consciousness: awake, alert , oriented and patient cooperative  Airway & Oxygen Therapy: Patient Spontanous Breathing and Patient connected to nasal cannula oxygen  Post-op Assessment: Report given to RN and Post -op Vital signs reviewed and stable  Post vital signs: Reviewed and stable  Last Vitals:  Vitals Value Taken Time  BP 134/57 07/24/2018  3:25 PM  Temp    Pulse 91 07/24/2018  3:27 PM  Resp 25 07/24/2018  3:27 PM  SpO2 98 % 07/24/2018  3:27 PM  Vitals shown include unvalidated device data.  Last Pain:  Vitals:   07/24/18 1057  TempSrc:   PainSc: 5          Complications: No apparent anesthesia complications

## 2018-07-24 NOTE — Discharge Instructions (Signed)
° °Kevin M. Supple, M.D., F.A.A.O.S. °Orthopaedic Surgery °Specializing in Arthroscopic and Reconstructive °Surgery of the Shoulder and Knee °[phone] °[address] - [place], [place] [address] - Fax [phone] ° ° °POST-OP TOTAL SHOULDER REPLACEMENT INSTRUCTIONS ° °1. Call the office at [phone] to schedule your first post-op appointment 10-14 days from the date of your surgery. ° °2. The bandage over your incision is waterproof. You may begin showering with this dressing on. You may leave this dressing on until first follow up appointment within 2 weeks. We prefer you leave this dressing in place until follow up however after 5-7 days if you are having itching or skin irritation and would like to remove it you may do so. Go slow and tug at the borders gently to break the bond the dressing has with the skin. At this point if there is no drainage it is okay to go without a bandage or you may cover it with a light guaze and tape. You can also expect significant bruising around your shoulder that will drift down your arm and into your chest wall. This is very normal and should resolve over several days. ° ° 3. Wear your sling/immobilizer at all times except to perform the exercises below or to occasionally let your arm dangle by your side to stretch your elbow. You also need to sleep in your sling immobilizer until instructed otherwise. ° °4. Range of motion to your elbow, wrist, and hand are encouraged 3-5 times daily. Exercise to your hand and fingers helps to reduce swelling you may experience. ° °5. Utilize ice to the shoulder 3-5 times minimum a day and additionally if you are experiencing pain. ° °6. Prescriptions for a pain medication and a muscle relaxant are provided for you. It is recommended that if you are experiencing pain that you pain medication alone is not controlling, add the muscle relaxant along with the pain medication which can give additional pain relief. The first 1-2 days  is generally the most severe of your pain and then should gradually decrease. As your pain lessens it is recommended that you decrease your use of the pain medications to an "as needed basis'" only and to always comply with the recommended dosages of the pain medications. ° °7. Pain medications can produce constipation along with their use. If you experience this, the use of an over the counter stool softener or laxative daily is recommended.  ° °8. For additional questions or concerns, please do not hesitate to call the office. If after hours there is an answering service to forward your concerns to the physician on call. ° °9.Pain control following an exparel block ° °To help control your post-operative pain you received a nerve block  performed with Exparel which is a long acting anesthetic (numbing agent) which can provide pain relief and sensations of numbness (and relief of pain) in the operative shoulder and arm for up to 3 days. Sometimes it provides mixed relief, meaning you may still have numbness in certain areas of the arm but can still be able to move  parts of that arm, hand, and fingers. We recommend that your prescribed pain medications  be used as needed. We do not feel it is necessary to "pre medicate" and "stay ahead" of pain.  Taking narcotic pain medications when you are not having any pain can lead to unnecessary and potentially dangerous side effects.  ° °POST-OP EXERCISES ° °Pendulum Exercises ° °Perform pendulum exercises while standing and bending at   the waist. Support your uninvolved arm on a table or chair and allow your operated arm to hang freely. Make sure to do these exercises passively - not using you shoulder muscles. ° °Repeat 20 times. Do 3 sessions per day. ° ° ° ° °

## 2018-07-24 NOTE — Op Note (Signed)
07/24/2018  3:11 PM  PATIENT:   Carla Schmitt  72 y.o. female  PRE-OPERATIVE DIAGNOSIS:  right shoulder osteoarthritis  POST-OPERATIVE DIAGNOSIS: Same  PROCEDURE: Right total shoulder arthroplasty utilizing a press-fit size 9 Arthrex stem, 44 x 17 head, and small glenoid  SURGEON:  Rocky Rishel, Metta Clines M.D.  ASSISTANTS: Jenetta Loges, PA-C  ANESTHESIA:   General endotracheal as well as interscalene block with Exparel  EBL: 200 cc  SPECIMEN: None  Drains: None   PATIENT DISPOSITION:  PACU - hemodynamically stable.    PLAN OF CARE: Admit for overnight observation  Brief history:  Carla Schmitt is a 72 year old female said chronic and progressively increasing right shoulder pain related to end-stage osteoarthritis.  Due to her increasing pain and functional mentation she is brought to the operating this time for planned right total shoulder arthroplasty  Preoperatively she then counseled regarding treatment options as well as the potential risks versus benefits thereof.  Possible surgical complications were reviewed including potential for bleeding, infection, neurovascular injury, persistent pain, anesthetic complication, failure of the implant, and possible need for additional surgery.  She understands and accepts and agrees with the planned procedure.  Procedure in detail:  After undergoing routine preop evaluation patient received prophylactic antibiotics and interscalene block was established with Exparel in the holding area by the anesthesia department.  Placed supine on the operative table underwent smooth induction of a general endotracheal anesthesia.  Placed in the beachchair position and appropriate padding and protected.  Right shoulder girdle region was sterilely prepped and draped in standard fashion.  Timeout was called.  An anterior deltopectoral approach to the right shoulder was made through a 10 cm incision.  Skin flaps elevated dissection carried deeply and  electrocautery was used for hemostasis.  The deltopectoral interval was then developed from proximal to distal with a vein taken laterally and upper centimeter of the pectoralis major was tenotomized.  The long head biceps tendon was tenotomized.  The tendon was then unroofed and excised proximally.  We outlined the margins of the subscapularis insertion into the lesser tuberosity and once this was identified performed a lesser tuberosity osteotomy using an oscillating saw and then the bone fragment was mobilized and reflected medially and then we divided the capsular attachments from the anterior and inferior margins of the humeral neck.  Humeral head was then delivered through the wound.  We outlined the proposed humeral head resection with the extra medullary guide and then the head was excised with an oscillating saw.  The remaining peripheral ossified was removed with rondure.  The humeral canal was then prepared hand reaming and then broached up to size 9.  Stem with metal cap placed into the canal we then exposed the glenoid with appropriate retractors.  I performed a circumferential labral resection and then placed a guidepin into the center of the glenoid after confirming that a small glenoid was the appropriate size.  The glenoid was then reamed and prepared with a central drill hole followed by the superior and inferior peg and slot respectively.  The appointment was broached and showed good fit.  The glenoid was then cleaned and dried cement was mixed introduced into the superior and inferior pole and slot respectively and then morselized bone was impacted around her central peg and again the glenoid was then impacted into position.  Excellent fit was achieved.  We then returned to attention back to the proximal humerus were replaced drill holes through the humeral metaphysis to allow for repair  of our lesser tuberosity osteotomy.  Our implant was then prepared passing suture limbs through the eyelets on  the collar of the stem to allow for the repair of the LTO.  Once the sutures were all passed through the bone tunnels these implant was then seated to the proper level and the proximal locking screws were tightened the overall fit and construct was placed our satisfaction.  We then performed trial reductions in the 44 x 17 humeral head gave [place] excellent soft tissue balance with approximately 50% translation of the humeral head of the glenoid.  This point the trial was removed the Carla Schmitt taper was cleaned and dried the final implant was then impacted onto the humeral stem the joint was irrigated final reduction was then performed.  At this point the suture limbs were then passed through the bone tendon injection of the LTO and the LDL was then repaired utilizing a series of 4 sutures to pairs parallel to pairs and crossing out of all of which allowed excellent re-apposition of the LTO to the bed on the metaphysis of the proximal humerus.  Then repaired the rotator interval with a series of figure-of-eight suture tape sutures.  Final construct was much our satisfaction with mobility allowing external rotation degrees without excessive tension on the LTL repair.  Wound was again irrigated hemostasis was obtained.  The deltopectoral interval was closed with a series of interrupted figure-of-eight number Vicryl sutures.  2-0 Vicryl used for the subcu layer intracuticular through Monocryl for the skin followed by Dermabond and Aquasol dressing right arm was placed with sling the patient was awakened explained to recovery room in stable condition.  Tracy sugar PA-C was used as an Environmental consultant throughout this case essential for help with positioning the patient, position extremity, tissue ablation, suture management, wound closure, and intraoperative decision-making.  Marin Shutter MD   Contact # [phone]

## 2018-07-24 NOTE — Anesthesia Preprocedure Evaluation (Addendum)
Anesthesia Evaluation  Patient identified by MRN, date of birth, ID band Patient awake    Reviewed: Allergy & Precautions, H&P , NPO status , Patient's Chart, lab work & pertinent test results  History of Anesthesia Complications (+) PONV  Airway Mallampati: II  TM Distance: >3 FB Neck ROM: Full    Dental no notable dental hx. (+) Partial Upper, Partial Lower, Dental Advisory Given   Pulmonary neg pulmonary ROS, former smoker,    Pulmonary exam normal breath sounds clear to auscultation       Cardiovascular hypertension, Pt. on medications negative cardio ROS Normal cardiovascular exam Rhythm:Regular Rate:Normal     Neuro/Psych negative neurological ROS  negative psych ROS   GI/Hepatic negative GI ROS, Neg liver ROS,   Endo/Other  Morbid obesity  Renal/GU negative Renal ROS  negative genitourinary   Musculoskeletal  (+) Arthritis ,   Abdominal (+) + obese,   Peds negative pediatric ROS (+)  Hematology negative hematology ROS (+)   Anesthesia Other Findings   Reproductive/Obstetrics negative OB ROS                            Anesthesia Physical  Anesthesia Plan  ASA: III  Anesthesia Plan: General   Post-op Pain Management:  Regional for Post-op pain   Induction: Intravenous  PONV Risk Score and Plan: 4 or greater and Ondansetron, Dexamethasone, Midazolam, Scopolamine patch - Pre-op and Treatment may vary due to age or medical condition  Airway Management Planned: Oral ETT  Additional Equipment:   Intra-op Plan:   Post-operative Plan: Extubation in OR  Informed Consent: I have reviewed the patients History and Physical, chart, labs and discussed the procedure including the risks, benefits and alternatives for the proposed anesthesia with the patient or authorized representative who has indicated his/her understanding and acceptance.   Dental advisory given  Plan  Discussed with: CRNA  Anesthesia Plan Comments:         Anesthesia Quick Evaluation

## 2018-07-24 NOTE — Anesthesia Procedure Notes (Signed)
Procedure Name: Intubation Date/Time: 07/24/2018 1:26 PM Performed by: Kyung Rudd, CRNA Pre-anesthesia Checklist: Patient identified, Emergency Drugs available, Suction available and Patient being monitored Patient Re-evaluated:Patient Re-evaluated prior to induction Oxygen Delivery Method: Circle system utilized Preoxygenation: Pre-oxygenation with 100% oxygen Induction Type: IV induction Ventilation: Mask ventilation without difficulty Laryngoscope Size: Mac and 3 Grade View: Grade I Tube type: Oral Tube size: 7.0 mm Number of attempts: 1 Airway Equipment and Method: Stylet Placement Confirmation: ETT inserted through vocal cords under direct vision,  positive ETCO2 and breath sounds checked- equal and bilateral Secured at: 20 cm Tube secured with: Tape Dental Injury: Teeth and Oropharynx as per pre-operative assessment

## 2018-07-24 NOTE — Anesthesia Procedure Notes (Signed)
Anesthesia Regional Block: Interscalene brachial plexus block   Pre-Anesthetic Checklist: ,, timeout performed, Correct Patient, Correct Site, Correct Laterality, Correct Procedure, Correct Position, site marked, Risks and benefits discussed,  Surgical consent,  Pre-op evaluation,  At surgeon's request and post-op pain management  Laterality: Right  Prep: chloraprep       Needles:  Injection technique: Single-shot  Needle Type: Stimiplex     Needle Length: 9cm  Needle Gauge: 21     Additional Needles:   Procedures:,,,, ultrasound used (permanent image in chart),,,,  Narrative:  Start time: 07/24/2018 11:47 AM End time: 07/24/2018 11:52 AM Injection made incrementally with aspirations every 5 mL.  Performed by: Personally  Anesthesiologist: Lynda Rainwater, MD

## 2018-07-25 ENCOUNTER — Other Ambulatory Visit: Payer: Self-pay

## 2018-07-25 ENCOUNTER — Encounter (HOSPITAL_COMMUNITY): Payer: Self-pay | Admitting: Orthopedic Surgery

## 2018-07-25 MED ORDER — ONDANSETRON HCL 4 MG PO TABS: 4.0000 mg | ORAL_TABLET | Freq: Three times a day (TID) | ORAL | 0 refills | Status: DC | PRN

## 2018-07-25 MED ORDER — OXYCODONE-ACETAMINOPHEN 5-325 MG PO TABS: 1.0000 | ORAL_TABLET | ORAL | 0 refills | Status: DC | PRN

## 2018-07-25 NOTE — Discharge Summary (Signed)
PATIENT ID:      Carla Schmitt  MRN:     [id number] DOB/AGE:    [birth year] / 72 y.o.     DISCHARGE SUMMARY  ADMISSION DATE:    07/24/2018 DISCHARGE DATE:    ADMISSION DIAGNOSIS: right shoulder osteoarthritis Past Medical History:  Diagnosis Date  . Anemia    SOME HER KNEE SURGERY  . Arthritis   . Colon polyps   . Diverticulosis   . History of kidney stones   . Hypertension   . Irritable bowel   . PONV (postoperative nausea and vomiting)    WITH 2ND KNEE SURGERY, HAD SOME N/V  . Stiffness of joint, shoulder region    left  . Tinnitus    both ears most of the time  . Varicose veins    both legs    DISCHARGE DIAGNOSIS:   Active Problems:   Status post total shoulder arthroplasty, right   PROCEDURE: Procedure(s): RIGHT TOTAL SHOULDER ARTHROPLASTY on 07/24/2018  CONSULTS:    HISTORY:  See H&P in chart.  HOSPITAL COURSE:  Carla Schmitt is a 72 y.o. admitted on 07/24/2018 with a diagnosis of right shoulder osteoarthritis.  They were brought to the operating room on 07/24/2018 and underwent Procedure(s): RIGHT TOTAL SHOULDER ARTHROPLASTY.    They were given perioperative antibiotics:  Anti-infectives (From admission, onward)   Start     Dose/Rate Route Frequency Ordered Stop   07/24/18 1230  vancomycin (VANCOCIN) IVPB 1000 mg/200 mL premix     1,000 mg 200 mL/hr over 60 Minutes Intravenous On call to O.R. 07/24/18 1039 07/24/18 1204    .  Patient underwent the above named procedure and tolerated it well. The following day they were hemodynamically stable and pain was controlled on oral analgesics. They were vascularly intact to the operative extremity but block was still densely intact. OT was ordered and worked with patient per protocol. They were medically and orthopaedically stable for discharge on day 1 .   DIAGNOSTIC STUDIES:  RECENT RADIOGRAPHIC STUDIES :  No results found.  RECENT VITAL SIGNS:   Patient Vitals for the past 24 hrs:  BP Temp Temp src Pulse  Resp SpO2 Height Weight  07/25/18 0801 140/63 (!) 97.5 F (36.4 C) Oral 86 18 99 % - -  07/25/18 0007 130/65 98.6 F (37 C) Oral 90 16 99 % - -  07/24/18 2017 134/65 98.6 F (37 C) Oral 100 16 98 % - -  07/24/18 1622 138/61 97.8 F (36.6 C) Oral 79 16 100 % - -  07/24/18 1608 136/64 (!) 97.5 F (36.4 C) - 79 15 100 % - -  07/24/18 1555 (!) 122/58 - - 87 20 96 % - -  07/24/18 1540 124/61 - - 88 15 99 % - -  07/24/18 1525 (!) 134/57 (!) 97.5 F (36.4 C) - 91 14 96 % - -  07/24/18 1245 (!) 164/60 - - 62 14 100 % - -  07/24/18 1230 (!) 158/92 - - 64 13 100 % - -  07/24/18 1220 (!) 168/58 - - 63 17 100 % - -  07/24/18 1210 (!) 167/61 - - 77 18 100 % - -  07/24/18 1200 (!) 159/77 - - 65 16 99 % - -  07/24/18 1155 (!) 170/61 - - 70 11 100 % - -  07/24/18 1150 (!) 169/74 - - 72 14 100 % - -  07/24/18 1145 - - - 74 14 100 % - -  07/24/18  1044 (!) 167/67 98.1 F (36.7 C) Oral 85 18 100 % 5\' 3"  (1.6 m) 112.7 kg  .  RECENT EKG RESULTS:    Orders placed or performed in visit on 06/25/18  . EKG 12-Lead    DISCHARGE INSTRUCTIONS:    DISCHARGE MEDICATIONS:   Allergies as of 07/25/2018      Reactions   Cefoxitin Hives, Rash   Cefuroxime Axetil Hives, Rash    ceftin=hives      Medication List    TAKE these medications   amLODipine 5 MG tablet Commonly known as:  NORVASC TAKE 1 TABLET(5 MG) BY MOUTH AT BEDTIME What changed:    how much to take  how to take this  when to take this  additional instructions   aspirin 81 MG tablet Take 81 mg by mouth at bedtime.   calcium-vitamin D 250-125 MG-UNIT tablet Commonly known as:  OSCAL WITH D Take 2 tablets by mouth 2 (two) times daily.   ondansetron 4 MG tablet Commonly known as:  ZOFRAN Take 1 tablet (4 mg total) by mouth every 8 (eight) hours as needed for nausea or vomiting.   oxyCODONE-acetaminophen 5-325 MG tablet Commonly known as:  PERCOCET/ROXICET Take 1 tablet by mouth every 4 (four) hours as needed (max 6 q).    triamcinolone 0.025 % ointment Commonly known as:  KENALOG Apply 1 application topically 2 (two) times daily as needed. What changed:  reasons to take this   valsartan-hydrochlorothiazide 160-25 MG tablet Commonly known as:  DIOVAN-HCT Take 1 tablet by mouth daily.       FOLLOW UP VISIT:   Follow-up Information    Justice Britain, MD.   Specialty:  Orthopedic Surgery Why:  call to be seen in 10-14 days Contact information: [address] [place] [address] [phone]           DISCHARGE TO: Home   DISCHARGE CONDITION:  Carla Schmitt [name] for Dr. Justice Britain 07/25/2018, 9:58 AM

## 2018-07-25 NOTE — Evaluation (Signed)
Occupational Therapy Evaluation Patient Details Name: Carla Schmitt MRN: [id number] DOB: September 01, 1946 Today's Date: 07/25/2018    History of Present Illness Pt is a 72 y/o female s/p Right total shoulder arthroplasty. Pt has a PMH including Anemia, Arthritis, Colon polyps, Diverticulosis, History of kidney stones, Hypertension, Irritable bowel, PONV, Tinnitus, and Varicose veins,  Abdominal hysterectomy (02-09-2010); and Total knee arthroplasty (left 11/05/2011; Right, 02/09/2013).   Clinical Impression   PTA pt independent in ADL and mobility. Pt is max A for dressing/sling management, min A for BUE tasks. Block still largely in place. Educated on pain management and safety while block wears off. Passive shoulder precautions (handout provided): Educated patient on don doff sling with return demonstration, avoid shoulder movement. positioning with pillows in chair for sitting and sleeping (recommend recliner for sleeping if possible), pt educated on RUE care during bathing/dressing.  Home exercise program as stated below (indicated by MD). OT education complete and OT to sign off at this time and defer further therapy to MD at follow up appointment. Thank you for the opportunity to serve this patient.      Follow Up Recommendations  Follow surgeon's recommendation for DC plan and follow-up therapies    Equipment Recommendations  (discussed shower seat, Pt will purchase outside hospital)    Recommendations for Other Services       Precautions / Restrictions Precautions Precautions: Shoulder Type of Shoulder Precautions: passive protocol Shoulder Interventions: Shoulder sling/immobilizer;At all times;Off for dressing/bathing/exercises(ok to come out in controlled environment) Precaution Booklet Issued: Yes (comment) Precaution Comments: shoulder dc handout reviewed in full Required Braces or Orthoses: Sling Restrictions Weight Bearing Restrictions: Yes RUE Weight Bearing: Non weight bearing       Mobility Bed Mobility Overal bed mobility: Modified Independent             General bed mobility comments: no attempt to push/pull with RUE  Transfers Overall transfer level: Modified independent               General transfer comment: increased time, no attempt to push/pull with RUE    Balance                                           ADL either performed or assessed with clinical judgement   ADL                                         General ADL Comments: please see shoulder section below     Vision Patient Visual Report: No change from baseline       Perception     Praxis      Pertinent Vitals/Pain Pain Assessment: 0-10 Pain Score: 1  Pain Location: R shoulder Pain Descriptors / Indicators: Dull;Sore Pain Intervention(s): Limited activity within patient's tolerance;Monitored during session;Repositioned;Other (comment)(block still partially in place)     Hand Dominance Right   Extremity/Trunk Assessment Upper Extremity Assessment Upper Extremity Assessment: RUE deficits/detail RUE Deficits / Details: post-op deficits as anticipated post-op RUE: Unable to fully assess due to immobilization RUE Sensation: decreased light touch(block still partially in place) RUE Coordination: decreased gross motor   Lower Extremity Assessment Lower Extremity Assessment: Overall WFL for tasks assessed       Communication Communication Communication: No difficulties   Cognition Arousal/Alertness: Awake/alert  Behavior During Therapy: WFL for tasks assessed/performed Overall Cognitive Status: Within Functional Limits for tasks assessed                                     General Comments       Exercises Exercises: Shoulder Shoulder Exercises Pendulum Exercise: PROM;Right;10 reps;Seated;Standing(GENTLE) Shoulder Flexion: Self ROM;Right(educated for ADL only, ok to 60 degrees) Shoulder ABduction: Self  ROM;Right(educated for ADL only, ok to 45 degrees) Shoulder External Rotation: Self ROM;Right;Other (comment)(educated for ADL only, ok to 20 degrees) Elbow Flexion: AROM;Right;10 reps Elbow Extension: AROM;Right;10 reps Wrist Flexion: AROM;Right Wrist Extension: AROM;Right Digit Composite Flexion: AROM;Right Composite Extension: AROM;Right Neck Flexion: AROM Neck Extension: AROM Neck Lateral Flexion - Right: AROM Neck Lateral Flexion - Left: AROM   Shoulder Instructions Shoulder Instructions Donning/doffing shirt without moving shoulder: Maximal assistance Method for sponge bathing under operated UE: Supervision/safety Donning/doffing sling/immobilizer: Maximal assistance Correct positioning of sling/immobilizer: Maximal assistance Pendulum exercises (written home exercise program): Supervision/safety ROM for elbow, wrist and digits of operated UE: Supervision/safety Sling wearing schedule (on at all times/off for ADL's): Independent Proper positioning of operated UE when showering: Supervision/safety Positioning of UE while sleeping: Minimal assistance    Home Living Family/patient expects to be discharged to:: Private residence Living Arrangements: Spouse/significant other Available Help at Discharge: Family;Available 24 hours/day Type of Home: Apartment Home Access: Level entry     Home Layout: One level     Bathroom Shower/Tub: Teacher, early years/pre: Handicapped height     Home Equipment: Environmental consultant - 2 wheels          Prior Functioning/Environment Level of Independence: Independent                 OT Problem List: Decreased strength;Decreased range of motion;Decreased knowledge of use of DME or AE;Decreased knowledge of precautions;Impaired sensation;Impaired UE functional use;Pain      OT Treatment/Interventions:      OT Goals(Current goals can be found in the care plan section) Acute Rehab OT Goals Patient Stated Goal: to get back to  shopping and traveling with her family OT Goal Formulation: With patient Time For Goal Achievement: 08/02/18 Potential to Achieve Goals: Good  OT Frequency:     Barriers to D/C:            Co-evaluation              AM-PAC PT "6 Clicks" Daily Activity     Outcome Measure Help from another person eating meals?: A Little Help from another person taking care of personal grooming?: A Little Help from another person toileting, which includes using toliet, bedpan, or urinal?: A Little Help from another person bathing (including washing, rinsing, drying)?: A Little Help from another person to put on and taking off regular upper body clothing?: A Lot Help from another person to put on and taking off regular lower body clothing?: A Lot 6 Click Score: 16   End of Session Equipment Utilized During Treatment: Other (comment)(sling) Nurse Communication: Mobility status;Weight bearing status;Precautions  Activity Tolerance: Patient tolerated treatment well Patient left: in chair;with call bell/phone within reach;with nursing/sitter in room  OT Visit Diagnosis: Pain Pain - Right/Left: Right Pain - part of body: Shoulder                Time: 5784-6962 OT Time Calculation (min): 41 min Charges:  OT General Charges $OT Visit: 1 Visit OT  Evaluation $OT Eval Moderate Complexity: 1 Mod OT Treatments $Self Care/Home Management : 8-22 mins $Therapeutic Exercise: 8-22 mins  Hulda Humphrey OTR/L Acute Rehabilitation Services Pager: [phone] Office: [phone]  Merri Ray Davon Abdelaziz 07/25/2018, 1:02 PM

## 2018-07-25 NOTE — Progress Notes (Signed)
Nsg Discharge Note  Admit Date:  07/24/2018 Discharge date: 07/25/2018   Almina P Feltus to be D/C'd Home per MD order.  AVS completed.  Copy for chart, and copy for patient signed, and dated. Patient/caregiver able to verbalize understanding.  Discharge Medication: Allergies as of 07/25/2018      Reactions   Cefoxitin Hives, Rash   Cefuroxime Axetil Hives, Rash    ceftin=hives      Medication List    TAKE these medications   amLODipine 5 MG tablet Commonly known as:  NORVASC TAKE 1 TABLET(5 MG) BY MOUTH AT BEDTIME What changed:    how much to take  how to take this  when to take this  additional instructions   aspirin 81 MG tablet Take 81 mg by mouth at bedtime.   calcium-vitamin D 250-125 MG-UNIT tablet Commonly known as:  OSCAL WITH D Take 2 tablets by mouth 2 (two) times daily.   ondansetron 4 MG tablet Commonly known as:  ZOFRAN Take 1 tablet (4 mg total) by mouth every 8 (eight) hours as needed for nausea or vomiting.   oxyCODONE-acetaminophen 5-325 MG tablet Commonly known as:  PERCOCET/ROXICET Take 1 tablet by mouth every 4 (four) hours as needed (max 6 q).   triamcinolone 0.025 % ointment Commonly known as:  KENALOG Apply 1 application topically 2 (two) times daily as needed. What changed:  reasons to take this   valsartan-hydrochlorothiazide 160-25 MG tablet Commonly known as:  DIOVAN-HCT Take 1 tablet by mouth daily.       Discharge Assessment: Vitals:   07/25/18 0007 07/25/18 0801  BP: 130/65 140/63  Pulse: 90 86  Resp: 16 18  Temp: 98.6 F (37 C) (!) 97.5 F (36.4 C)  SpO2: 99% 99%   Skin clean, dry and intact without evidence of skin break down, no evidence of skin tears noted. IV catheter discontinued intact. Site without signs and symptoms of complications - no redness or edema noted at insertion site, patient denies c/o pain - only slight tenderness at site.  Dressing with slight pressure applied.  D/c  Instructions-Education: Discharge instructions given to patient/family with verbalized understanding. D/c education completed with patient/family including follow up instructions, medication list, d/c activities limitations if indicated, with other d/c instructions as indicated by MD - patient able to verbalize understanding, all questions fully answered. Patient instructed to return to ED, call 911, or call MD for any changes in condition.  Patient escorted via [place], and D/C home via private auto.  Eda Keys, RN 07/25/2018 2:34 PM

## 2018-07-27 ENCOUNTER — Encounter (HOSPITAL_COMMUNITY): Payer: Self-pay | Admitting: Orthopedic Surgery

## 2018-07-27 NOTE — Anesthesia Postprocedure Evaluation (Signed)
Anesthesia Post Note  Patient: Carla Schmitt  Procedure(s) Performed: RIGHT TOTAL SHOULDER ARTHROPLASTY (Right Shoulder)     Patient location during evaluation: PACU Anesthesia Type: General and Regional Level of consciousness: awake and alert Pain management: pain level controlled Vital Signs Assessment: post-procedure vital signs reviewed and stable Respiratory status: spontaneous breathing, nonlabored ventilation, respiratory function stable and patient connected to nasal cannula oxygen Cardiovascular status: blood pressure returned to baseline and stable Postop Assessment: no apparent nausea or vomiting Anesthetic complications: no    Last Vitals:  Vitals:   07/25/18 0007 07/25/18 0801  BP: 130/65 140/63  Pulse: 90 86  Resp: 16 18  Temp: 37 C (!) 36.4 C  SpO2: 99% 99%    Last Pain:  Vitals:   07/25/18 1306  TempSrc:   PainSc: 2                  Ranson Belluomini

## 2018-08-06 DIAGNOSIS — Z4789 Encounter for other orthopedic aftercare: Secondary | ICD-10-CM | POA: Diagnosis not present

## 2018-08-06 DIAGNOSIS — Z471 Aftercare following joint replacement surgery: Secondary | ICD-10-CM | POA: Diagnosis not present

## 2018-08-13 DIAGNOSIS — M25611 Stiffness of right shoulder, not elsewhere classified: Secondary | ICD-10-CM | POA: Diagnosis not present

## 2018-08-18 DIAGNOSIS — M25611 Stiffness of right shoulder, not elsewhere classified: Secondary | ICD-10-CM | POA: Diagnosis not present

## 2018-08-21 DIAGNOSIS — M25611 Stiffness of right shoulder, not elsewhere classified: Secondary | ICD-10-CM | POA: Diagnosis not present

## 2018-08-25 DIAGNOSIS — M25611 Stiffness of right shoulder, not elsewhere classified: Secondary | ICD-10-CM | POA: Diagnosis not present

## 2018-10-14 DIAGNOSIS — H52203 Unspecified astigmatism, bilateral: Secondary | ICD-10-CM | POA: Diagnosis not present

## 2018-10-14 DIAGNOSIS — Z961 Presence of intraocular lens: Secondary | ICD-10-CM | POA: Diagnosis not present

## 2018-10-14 DIAGNOSIS — H5213 Myopia, bilateral: Secondary | ICD-10-CM | POA: Diagnosis not present

## 2018-10-14 DIAGNOSIS — H4321 Crystalline deposits in vitreous body, right eye: Secondary | ICD-10-CM | POA: Diagnosis not present

## 2018-10-14 DIAGNOSIS — H524 Presbyopia: Secondary | ICD-10-CM | POA: Diagnosis not present

## 2018-10-20 DIAGNOSIS — Z96611 Presence of right artificial shoulder joint: Secondary | ICD-10-CM | POA: Diagnosis not present

## 2018-10-20 DIAGNOSIS — Z471 Aftercare following joint replacement surgery: Secondary | ICD-10-CM | POA: Diagnosis not present

## 2019-01-21 DIAGNOSIS — Z96611 Presence of right artificial shoulder joint: Secondary | ICD-10-CM | POA: Diagnosis not present

## 2019-01-21 DIAGNOSIS — Z471 Aftercare following joint replacement surgery: Secondary | ICD-10-CM | POA: Diagnosis not present

## 2019-06-25 ENCOUNTER — Other Ambulatory Visit: Payer: Self-pay | Admitting: Family Medicine

## 2019-06-25 DIAGNOSIS — Z1231 Encounter for screening mammogram for malignant neoplasm of breast: Secondary | ICD-10-CM

## 2019-06-30 ENCOUNTER — Other Ambulatory Visit: Payer: Self-pay

## 2019-06-30 ENCOUNTER — Ambulatory Visit (INDEPENDENT_AMBULATORY_CARE_PROVIDER_SITE_OTHER): Payer: Medicare HMO | Admitting: Family Medicine

## 2019-06-30 ENCOUNTER — Encounter: Payer: Self-pay | Admitting: Family Medicine

## 2019-06-30 VITALS — BP 114/72 | HR 80 | Temp 97.4°F | Resp 16 | Ht 63.0 in | Wt 240.4 lb

## 2019-06-30 DIAGNOSIS — Z0000 Encounter for general adult medical examination without abnormal findings: Secondary | ICD-10-CM

## 2019-06-30 DIAGNOSIS — R7303 Prediabetes: Secondary | ICD-10-CM | POA: Diagnosis not present

## 2019-06-30 DIAGNOSIS — E785 Hyperlipidemia, unspecified: Secondary | ICD-10-CM

## 2019-06-30 DIAGNOSIS — Z23 Encounter for immunization: Secondary | ICD-10-CM | POA: Diagnosis not present

## 2019-06-30 DIAGNOSIS — I10 Essential (primary) hypertension: Secondary | ICD-10-CM | POA: Diagnosis not present

## 2019-06-30 LAB — BASIC METABOLIC PANEL
BUN: 20 mg/dL (ref 6–23)
CO2: 31 mEq/L (ref 19–32)
Calcium: 10.6 mg/dL — ABNORMAL HIGH (ref 8.4–10.5)
Chloride: 101 mEq/L (ref 96–112)
Creatinine, Ser: 0.86 mg/dL (ref 0.40–1.20)
GFR: 78.64 mL/min (ref >60.00)
Glucose, Bld: 102 mg/dL — ABNORMAL HIGH (ref 70–99)
Potassium: 3.5 mEq/L (ref 3.5–5.1)
Sodium: 140 mEq/L (ref 135–145)

## 2019-06-30 LAB — HEMOGLOBIN A1C: Hgb A1c MFr Bld: 5.7 % (ref 4.6–6.5)

## 2019-06-30 LAB — LIPID PANEL
Cholesterol: 196 mg/dL (ref 0–200)
HDL: 62.1 mg/dL (ref >39.00)
LDL Cholesterol: 102 mg/dL — ABNORMAL HIGH (ref 0–99)
NonHDL: 133.98
Total CHOL/HDL Ratio: 3
Triglycerides: 158 mg/dL — ABNORMAL HIGH (ref 0.0–149.0)
VLDL: 31.6 mg/dL (ref 0.0–40.0)

## 2019-06-30 LAB — HEPATIC FUNCTION PANEL
ALT: 10 U/L (ref 0–35)
AST: 15 U/L (ref 0–37)
Albumin: 4.6 g/dL (ref 3.5–5.2)
Alkaline Phosphatase: 76 U/L (ref 39–117)
Bilirubin, Direct: 0.1 mg/dL (ref 0.0–0.3)
Total Bilirubin: 0.5 mg/dL (ref 0.2–1.2)
Total Protein: 7.4 g/dL (ref 6.0–8.3)

## 2019-06-30 MED ORDER — VALSARTAN-HYDROCHLOROTHIAZIDE 160-25 MG PO TABS: 1.0000 | ORAL_TABLET | Freq: Every day | ORAL | 3 refills | Status: DC

## 2019-06-30 MED ORDER — TRIAMCINOLONE ACETONIDE 0.025 % EX OINT: 1.0000 "application " | TOPICAL_OINTMENT | Freq: Two times a day (BID) | CUTANEOUS | 2 refills | Status: DC | PRN

## 2019-06-30 MED ORDER — AMLODIPINE BESYLATE 5 MG PO TABS: ORAL_TABLET | ORAL | 3 refills | Status: DC

## 2019-06-30 NOTE — Progress Notes (Signed)
Subjective:     Patient ID: Carla Schmitt, female   DOB: [birth year], 73 y.o.   MRN: [id number]  HPI Ms. Blakely is seen for physical exam.  She had shoulder surgery last year and has had slow recovery from that.  Still has limited range of motion.  She has hypertension which is treated with Diovan HCTZ and amlodipine.  No recent dizziness.  No headaches.  No chest pains.  No recent falls.  Needs flu vaccine.  Other immunizations up-to-date.  Colonoscopy up-to-date.  She is scheduled for mammogram in December.  Family history reviewed.  Brother died age 63 of heart disease and father had heart disease in his 52s.  She has 1 brother with diabetes.  Patient smoked briefly for about 10 years but this was over 30 years ago.  She is married and this is her second marriage and she has been married about 60 years to her current husband  Past Medical History:  Diagnosis Date  . Anemia    SOME HER KNEE SURGERY  . Arthritis   . Colon polyps   . Diverticulosis   . History of kidney stones   . Hypertension   . Irritable bowel   . PONV (postoperative nausea and vomiting)    WITH 2ND KNEE SURGERY, HAD SOME N/V  . Stiffness of joint, shoulder region    left  . Tinnitus    both ears most of the time  . Varicose veins    both legs   Past Surgical History:  Procedure Laterality Date  . ABDOMINAL HYSTERECTOMY  02-09-2010   TAH  . BREAST BIOPSY Left 2018   Fibroadenoma  . CATARACT EXTRACTION W/PHACO Right 05/17/2015   Procedure: CATARACT EXTRACTION PHACO AND INTRAOCULAR LENS PLACEMENT ([place]);  Surgeon: Rutherford Guys, MD;  Location: [facility];  Service: Ophthalmology;  Laterality: Right;  CDE:6.12  . CATARACT EXTRACTION W/PHACO Left 05/31/2015   Procedure: CATARACT EXTRACTION PHACO AND INTRAOCULAR LENS PLACEMENT ([facility]);  Surgeon: Rutherford Guys, MD;  Location: [facility];  Service: Ophthalmology;  Laterality: Left;  CDE: 6.27  . EYE SURGERY    . FOOT SURGERY  many yrs ago   both feet  . JOINT REPLACEMENT    .  TOTAL KNEE ARTHROPLASTY  11/05/2011   Procedure: TOTAL KNEE ARTHROPLASTY;  Surgeon: Gearlean Alf, MD;  Location: [facility];  Service: Orthopedics;  Laterality: Left;  . TOTAL KNEE ARTHROPLASTY Right 02/09/2013   Procedure: RIGHT TOTAL KNEE ARTHROPLASTY;  Surgeon: Gearlean Alf, MD;  Location: [facility];  Service: Orthopedics;  Laterality: Right;  . TOTAL SHOULDER ARTHROPLASTY Right 07/24/2018   Procedure: RIGHT TOTAL SHOULDER ARTHROPLASTY;  Surgeon: Justice Britain, MD;  Location: [facility];  Service: Orthopedics;  Laterality: Right;  154min    reports that she quit smoking about 28 years ago. Her smoking use included cigarettes. She has a 18.00 pack-year smoking history. She has never used smokeless tobacco. She reports that she does not drink alcohol or use drugs. family history includes Arthritis in an other family member; Colon polyps in her mother; Diabetes in her brother; Heart disease in her brother, father, and another family member; Hypertension in her brother, father, and sister; Stroke in an other family member. Allergies  Allergen Reactions  . Cefoxitin Hives and Rash  . Cefuroxime Axetil Hives and Rash     ceftin=hives     Review of Systems  Constitutional: Negative for activity change, appetite change, fatigue, fever and unexpected weight change.  HENT: Negative for ear pain,  hearing loss, sore throat and trouble swallowing.   Eyes: Negative for visual disturbance.  Respiratory: Negative for cough and shortness of breath.   Cardiovascular: Negative for chest pain and palpitations.  Gastrointestinal: Negative for abdominal pain, blood in stool, constipation and diarrhea.  Endocrine: Negative for polydipsia and polyuria.  Genitourinary: Negative for dysuria and hematuria.  Musculoskeletal: Positive for arthralgias. Negative for back pain and myalgias.  Skin: Negative for rash.  Neurological: Negative for dizziness, syncope and headaches.  Hematological: Negative for adenopathy.   Psychiatric/Behavioral: Negative for confusion and dysphoric mood.       Objective:   Physical Exam Constitutional:      Appearance: She is well-developed.  HENT:     Head: Normocephalic and atraumatic.     Right Ear: Tympanic membrane normal.     Left Ear: Tympanic membrane normal.  Eyes:     Pupils: Pupils are equal, round, and reactive to light.  Neck:     Musculoskeletal: Normal range of motion and neck supple.     Thyroid: No thyromegaly.  Cardiovascular:     Rate and Rhythm: Normal rate and regular rhythm.     Heart sounds: Normal heart sounds. No murmur.  Pulmonary:     Effort: No respiratory distress.     Breath sounds: Normal breath sounds. No wheezing or rales.  Abdominal:     General: Bowel sounds are normal. There is no distension.     Palpations: Abdomen is soft. There is no mass.     Tenderness: There is no abdominal tenderness. There is no guarding or rebound.  Musculoskeletal: Normal range of motion.     Right lower leg: No edema.     Left lower leg: No edema.  Lymphadenopathy:     Cervical: No cervical adenopathy.  Skin:    Findings: No rash.  Neurological:     Mental Status: She is alert and oriented to person, place, and time.     Cranial Nerves: No cranial nerve deficit.     Deep Tendon Reflexes: Reflexes normal.  Psychiatric:        Behavior: Behavior normal.        Thought Content: Thought content normal.        Judgment: Judgment normal.        Assessment:     Physical exam.  Patient has hypertension which is well controlled.  Longstanding history of obesity.  Past history of prediabetes    Plan:     -Recheck basic metabolic panel, lipid panel, hepatic panel, A1c -Flu vaccine given -Repeat colonoscopy due 2022 -Mammogram scheduled for 08/14/2019 -Refilled her regular medications for 1 year  Eulas Post MD [facility]

## 2019-08-03 DIAGNOSIS — Z96611 Presence of right artificial shoulder joint: Secondary | ICD-10-CM | POA: Diagnosis not present

## 2019-08-03 DIAGNOSIS — Z471 Aftercare following joint replacement surgery: Secondary | ICD-10-CM | POA: Diagnosis not present

## 2019-08-14 ENCOUNTER — Other Ambulatory Visit: Payer: Self-pay

## 2019-08-14 ENCOUNTER — Ambulatory Visit
Admission: RE | Admit: 2019-08-14 | Discharge: 2019-08-14 | Disposition: A | Discharge status: Home / Self Care | Payer: Medicare HMO | Source: Ambulatory Visit | Attending: Family Medicine | Admitting: Family Medicine

## 2019-08-14 DIAGNOSIS — Z1231 Encounter for screening mammogram for malignant neoplasm of breast: Secondary | ICD-10-CM | POA: Diagnosis not present

## 2020-03-31 ENCOUNTER — Other Ambulatory Visit: Payer: Self-pay | Admitting: Family Medicine

## 2020-04-05 ENCOUNTER — Other Ambulatory Visit: Payer: Self-pay | Admitting: Family Medicine

## 2020-04-05 ENCOUNTER — Telehealth: Payer: Self-pay | Admitting: Family Medicine

## 2020-04-05 ENCOUNTER — Other Ambulatory Visit: Payer: Self-pay

## 2020-04-05 MED ORDER — AMLODIPINE BESYLATE 5 MG PO TABS: ORAL_TABLET | ORAL | 0 refills | Status: DC

## 2020-04-05 NOTE — Telephone Encounter (Signed)
none

## 2020-05-16 DIAGNOSIS — H5213 Myopia, bilateral: Secondary | ICD-10-CM | POA: Diagnosis not present

## 2020-05-16 DIAGNOSIS — Z471 Aftercare following joint replacement surgery: Secondary | ICD-10-CM | POA: Diagnosis not present

## 2020-05-16 DIAGNOSIS — Z96651 Presence of right artificial knee joint: Secondary | ICD-10-CM | POA: Diagnosis not present

## 2020-05-16 DIAGNOSIS — H52203 Unspecified astigmatism, bilateral: Secondary | ICD-10-CM | POA: Diagnosis not present

## 2020-05-16 DIAGNOSIS — H524 Presbyopia: Secondary | ICD-10-CM | POA: Diagnosis not present

## 2020-05-16 DIAGNOSIS — S8001XA Contusion of right knee, initial encounter: Secondary | ICD-10-CM | POA: Diagnosis not present

## 2020-05-16 DIAGNOSIS — Z961 Presence of intraocular lens: Secondary | ICD-10-CM | POA: Diagnosis not present

## 2020-06-29 ENCOUNTER — Other Ambulatory Visit: Payer: Self-pay | Admitting: Family Medicine

## 2020-06-30 DIAGNOSIS — R69 Illness, unspecified: Secondary | ICD-10-CM | POA: Diagnosis not present

## 2020-07-04 ENCOUNTER — Ambulatory Visit: Payer: Medicare HMO

## 2020-07-08 ENCOUNTER — Other Ambulatory Visit: Payer: Self-pay

## 2020-07-08 ENCOUNTER — Encounter: Payer: Self-pay | Admitting: Family Medicine

## 2020-07-08 ENCOUNTER — Ambulatory Visit (INDEPENDENT_AMBULATORY_CARE_PROVIDER_SITE_OTHER): Payer: Medicare HMO | Admitting: Family Medicine

## 2020-07-08 VITALS — BP 120/78 | HR 67 | Temp 98.3°F | Ht 63.0 in | Wt 242.8 lb

## 2020-07-08 DIAGNOSIS — Z0000 Encounter for general adult medical examination without abnormal findings: Secondary | ICD-10-CM

## 2020-07-08 DIAGNOSIS — Z13220 Encounter for screening for lipoid disorders: Secondary | ICD-10-CM | POA: Diagnosis not present

## 2020-07-08 DIAGNOSIS — Z131 Encounter for screening for diabetes mellitus: Secondary | ICD-10-CM | POA: Diagnosis not present

## 2020-07-08 NOTE — Progress Notes (Signed)
Established Patient Office Visit  Subjective:  Patient ID: Carla Schmitt, female    DOB: 1946-05-10  Age: 74 y.o. MRN: [id number]  CC:  Chief Complaint  Patient presents with  . Annual Exam    physical , pt is fasting ffor labs     HPI Carla Schmitt presents for physical exam.  She has history of morbid obesity and hypertension.  She remains on amlodipine and the losartan HCTZ.  Blood pressure has been controlled.  She has history of osteoarthritis and has had knee replacements bilaterally.  She has history of prediabetes.  Denies any recent falls.  Health maintenance reviewed  -Covid vaccines given including booster -She has had previous Zostavax but would like to consider Shingrix -Previous hepatitis C screen negative -Flu vaccine given 06/30/2020 -Pneumonia vaccines complete -Tetanus due 2023 -She plans to get mammogram follow-up in December -Colonoscopy due 11/22  Family history-brother with type 2 diabetes.  Hypertension in several family members including father and brother.  She also had coronary disease father and brother in their late 19s.  Mother had dementia.  Social history-she is married.  This is her second marriage and she has been married about 73 years of this marriage.  She had 2 children from first marriage.  Son lives in [place] and her daughter lives here. She quit smoking around 1992.  No alcohol use.  Past Medical History:  Diagnosis Date  . Anemia    SOME HER KNEE SURGERY  . Arthritis   . Colon polyps   . Diverticulosis   . History of kidney stones   . Hypertension   . Irritable bowel   . PONV (postoperative nausea and vomiting)    WITH 2ND KNEE SURGERY, HAD SOME N/V  . Stiffness of joint, shoulder region    left  . Tinnitus    both ears most of the time  . Varicose veins    both legs    Past Surgical History:  Procedure Laterality Date  . ABDOMINAL HYSTERECTOMY  02-09-2010   TAH  . BREAST BIOPSY Left 2018   Fibroadenoma  . CATARACT  EXTRACTION W/PHACO Right 05/17/2015   Procedure: CATARACT EXTRACTION PHACO AND INTRAOCULAR LENS PLACEMENT ([place]);  Surgeon: Rutherford Guys, MD;  Location: [facility];  Service: Ophthalmology;  Laterality: Right;  CDE:6.12  . CATARACT EXTRACTION W/PHACO Left 05/31/2015   Procedure: CATARACT EXTRACTION PHACO AND INTRAOCULAR LENS PLACEMENT ([facility]);  Surgeon: Rutherford Guys, MD;  Location: [facility];  Service: Ophthalmology;  Laterality: Left;  CDE: 6.27  . EYE SURGERY    . FOOT SURGERY  many yrs ago   both feet  . JOINT REPLACEMENT    . TOTAL KNEE ARTHROPLASTY  11/05/2011   Procedure: TOTAL KNEE ARTHROPLASTY;  Surgeon: Gearlean Alf, MD;  [facility];  Service: Orthopedics;  Laterality: Left;  . TOTAL KNEE ARTHROPLASTY Right 02/09/2013   Procedure: RIGHT TOTAL KNEE ARTHROPLASTY;  Surgeon: Gearlean Alf, MD;  [facility];  Service: Orthopedics;  Laterality: Right;  . TOTAL SHOULDER ARTHROPLASTY Right 07/24/2018   Procedure: RIGHT TOTAL SHOULDER ARTHROPLASTY;  Surgeon: Justice Britain, MD;  Location: [facility];  Service: Orthopedics;  Laterality: Right;  155min    Family History  Problem Relation Age of Onset  . Arthritis Other   . Heart disease Other   . Stroke Other   . Heart disease Father   . Hypertension Father   . Stroke Father   . Hypertension Sister   . Diabetes Brother   .  Heart disease Brother   . Hypertension Brother   . Colon polyps Mother   . Dementia Mother   . Colon cancer Neg Hx     Social History   Socioeconomic History  . Marital status: Married    Spouse name: Not on file  . Number of children: 2  . Years of education: Not on file  . Highest education level: Not on file  Occupational History  . Occupation: retired  Tobacco Use  . Smoking status: Former Smoker    Packs/day: 1.50    Years: 12.00    Pack years: 18.00    Types: Cigarettes    Quit date: 06/04/1991    Years since quitting: 29.1  . Smokeless tobacco: Never Used  Vaping Use  . Vaping Use: Never used    Substance and Sexual Activity  . Alcohol use: No  . Drug use: No  . Sexual activity: Never    Birth control/protection: Surgical  Other Topics Concern  . Not on file  Social History Narrative  . Not on file   Social Determinants of Health   Financial Resource Strain:   . Difficulty of Paying Living Expenses: Not on file  Food Insecurity:   . Worried About Charity fundraiser in the Last Year: Not on file  . Ran Out of Food in the Last Year: Not on file  Transportation Needs:   . Lack of Transportation (Medical): Not on file  . Lack of Transportation (Non-Medical): Not on file  Physical Activity:   . Days of Exercise per Week: Not on file  . Minutes of Exercise per Session: Not on file  Stress:   . Feeling of Stress : Not on file  Social Connections:   . Frequency of Communication with Friends and Family: Not on file  . Frequency of Social Gatherings with Friends and Family: Not on file  . Attends Religious Services: Not on file  . Active Member of Clubs or Organizations: Not on file  . Attends Archivist Meetings: Not on file  . Marital Status: Not on file  Intimate Partner Violence:   . Fear of Current or Ex-Partner: Not on file  . Emotionally Abused: Not on file  . Physically Abused: Not on file  . Sexually Abused: Not on file    Outpatient Medications Prior to Visit  Medication Sig Dispense Refill  . amLODipine (NORVASC) 5 MG tablet TAKE 1 TABLET(5 MG) BY MOUTH AT BEDTIME 90 tablet 0  . calcium-vitamin D (OSCAL WITH D) 250-125 MG-UNIT tablet Take 2 tablets by mouth 2 (two) times daily.    Marland Kitchen triamcinolone (KENALOG) 0.025 % ointment Apply 1 application topically 2 (two) times daily as needed. 45 g 2  . valsartan-hydrochlorothiazide (DIOVAN-HCT) 160-25 MG tablet TAKE 1 TABLET BY MOUTH DAILY 90 tablet 0  . aspirin 81 MG tablet Take 81 mg by mouth at bedtime.      No facility-administered medications prior to visit.    Allergies  Allergen Reactions  .  Cefoxitin Hives and Rash  . Cefuroxime Axetil Hives and Rash     ceftin=hives    ROS Review of Systems  Constitutional: Negative for appetite change, fatigue, fever and unexpected weight change.  HENT: Negative for trouble swallowing.   Eyes: Negative for visual disturbance.  Respiratory: Negative for cough, chest tightness, shortness of breath and wheezing.   Cardiovascular: Negative for chest pain, palpitations and leg swelling.  Gastrointestinal: Negative for abdominal pain.  Endocrine: Negative for polydipsia and  polyuria.  Genitourinary: Negative for dysuria.  Neurological: Negative for dizziness, seizures, syncope, weakness, light-headedness and headaches.  Psychiatric/Behavioral: Negative for dysphoric mood.      Objective:    Physical Exam Vitals reviewed.  Constitutional:      Appearance: Normal appearance.  HENT:     Right Ear: Tympanic membrane normal.     Left Ear: Tympanic membrane normal.     Mouth/Throat:     Mouth: Mucous membranes are moist.     Pharynx: Oropharynx is clear. No oropharyngeal exudate or posterior oropharyngeal erythema.  Cardiovascular:     Rate and Rhythm: Normal rate and regular rhythm.  Pulmonary:     Effort: Pulmonary effort is normal.     Breath sounds: Normal breath sounds.  Abdominal:     Palpations: Abdomen is soft.     Tenderness: There is no abdominal tenderness. There is no guarding or rebound.  Musculoskeletal:     Right lower leg: No edema.     Left lower leg: No edema.  Neurological:     General: No focal deficit present.     Mental Status: She is alert.     Cranial Nerves: No cranial nerve deficit.  Psychiatric:        Mood and Affect: Mood normal.        Thought Content: Thought content normal.     BP 120/78 (BP Location: Left Arm, Patient Position: Sitting, Cuff Size: Large)   Pulse 67   Temp 98.3 F (36.8 C) (Oral)   Ht 5\' 3"  (1.6 m)   Wt 242 lb 12.8 oz (110.1 kg)   SpO2 97%   BMI 43.01 kg/m  Wt Readings  from Last 3 Encounters:  07/08/20 242 lb 12.8 oz (110.1 kg)  06/30/19 240 lb 6.4 oz (109 kg)  07/24/18 248 lb 8 oz (112.7 kg)     There are no preventive care reminders to display for this patient.  There are no preventive care reminders to display for this patient.  Lab Results  Component Value Date   TSH 1.71 05/04/2016   Lab Results  Component Value Date   WBC 5.7 07/17/2018   HGB 12.3 07/17/2018   HCT 38.7 07/17/2018   MCV 85.8 07/17/2018   PLT 271 07/17/2018   Lab Results  Component Value Date   NA 140 06/30/2019   K 3.5 06/30/2019   CO2 31 06/30/2019   GLUCOSE 102 (H) 06/30/2019   BUN 20 06/30/2019   CREATININE 0.86 06/30/2019   BILITOT 0.5 06/30/2019   ALKPHOS 76 06/30/2019   AST 15 06/30/2019   ALT 10 06/30/2019   PROT 7.4 06/30/2019   ALBUMIN 4.6 06/30/2019   CALCIUM 10.6 (H) 06/30/2019   ANIONGAP 9 07/17/2018   GFR 78.64 06/30/2019   Lab Results  Component Value Date   CHOL 196 06/30/2019   Lab Results  Component Value Date   HDL 62.10 06/30/2019   Lab Results  Component Value Date   LDLCALC 102 (H) 06/30/2019   Lab Results  Component Value Date   TRIG 158.0 (H) 06/30/2019   Lab Results  Component Value Date   CHOLHDL 3 06/30/2019   Lab Results  Component Value Date   HGBA1C 5.7 06/30/2019      Assessment & Plan:   Problem List Items Addressed This Visit    None    Visit Diagnoses    Physical exam    -  Primary   Relevant Orders   Basic metabolic panel  Lipid panel   CBC with Differential/Platelet   TSH   Hepatic function panel   Hemoglobin A1c    We discussed the following  -Flu vaccine already given -We discussed Shingrix vaccine and she will consider getting this at the pharmacy in a couple weeks.  She does had her Covid vaccine in 1 to get that out of the way first -We have advised that she discontinue aspirin use as she does not have any issues regarding secondary prevention. -Obtain follow-up labs as above -We  strongly advised that she try to lose some weight.  She does not expand a lot which makes this challenging  No orders of the defined types were placed in this encounter.   Follow-up: No follow-ups on file.    Carolann Littler, MD

## 2020-07-08 NOTE — Patient Instructions (Signed)
Discontinue the aspirin  Consider getting Shingrix vaccine at pharmacy in 2-3 weeks.

## 2020-07-09 LAB — CBC WITH DIFFERENTIAL/PLATELET
Absolute Monocytes: 378 cells/uL (ref 200–950)
Basophils Absolute: 50 cells/uL (ref 0–200)
Basophils Relative: 0.8 %
Eosinophils Absolute: 118 cells/uL (ref 15–500)
Eosinophils Relative: 1.9 %
HCT: 37.2 % (ref 35.0–45.0)
Hemoglobin: 12.2 g/dL (ref 11.7–15.5)
Lymphs Abs: 2263 cells/uL (ref 850–3900)
MCH: 27.8 pg (ref 27.0–33.0)
MCHC: 32.8 g/dL (ref 32.0–36.0)
MCV: 84.7 fL (ref 80.0–100.0)
MPV: 10.3 fL (ref 7.5–12.5)
Monocytes Relative: 6.1 %
Neutro Abs: 3391 cells/uL (ref 1500–7800)
Neutrophils Relative %: 54.7 %
Platelets: 322 10*3/uL (ref 140–400)
RBC: 4.39 10*6/uL (ref 3.80–5.10)
RDW: 14.2 % (ref 11.0–15.0)
Total Lymphocyte: 36.5 %
WBC: 6.2 10*3/uL (ref 3.8–10.8)

## 2020-07-09 LAB — HEPATIC FUNCTION PANEL
AG Ratio: 1.6 (calc) (ref 1.0–2.5)
ALT: 12 U/L (ref 6–29)
AST: 18 U/L (ref 10–35)
Albumin: 4.4 g/dL (ref 3.6–5.1)
Alkaline phosphatase (APISO): 66 U/L (ref 37–153)
Bilirubin, Direct: 0.1 mg/dL (ref 0.0–0.2)
Globulin: 2.8 g/dL (calc) (ref 1.9–3.7)
Indirect Bilirubin: 0.3 mg/dL (calc) (ref 0.2–1.2)
Total Bilirubin: 0.4 mg/dL (ref 0.2–1.2)
Total Protein: 7.2 g/dL (ref 6.1–8.1)

## 2020-07-09 LAB — BASIC METABOLIC PANEL
BUN/Creatinine Ratio: 22 (calc) (ref 6–22)
BUN: 23 mg/dL (ref 7–25)
CO2: 25 mmol/L (ref 20–32)
Calcium: 10 mg/dL (ref 8.6–10.4)
Chloride: 102 mmol/L (ref 98–110)
Creat: 1.05 mg/dL — ABNORMAL HIGH (ref 0.60–0.93)
Glucose, Bld: 101 mg/dL — ABNORMAL HIGH (ref 65–99)
Potassium: 3.9 mmol/L (ref 3.5–5.3)
Sodium: 141 mmol/L (ref 135–146)

## 2020-07-09 LAB — HEMOGLOBIN A1C
Hgb A1c MFr Bld: 5.7 % of total Hgb — ABNORMAL HIGH (ref <5.7)
Mean Plasma Glucose: 117 (calc)
eAG (mmol/L): 6.5 (calc)

## 2020-07-09 LAB — LIPID PANEL
Cholesterol: 157 mg/dL (ref <200)
HDL: 74 mg/dL (ref >=50)
LDL Cholesterol (Calc): 64 mg/dL (calc)
Non-HDL Cholesterol (Calc): 83 mg/dL (calc) (ref <130)
Total CHOL/HDL Ratio: 2.1 (calc) (ref <5.0)
Triglycerides: 109 mg/dL (ref <150)

## 2020-07-09 LAB — TSH: TSH: 1.52 mIU/L (ref 0.40–4.50)

## 2020-08-15 ENCOUNTER — Telehealth: Payer: Self-pay | Admitting: Family Medicine

## 2020-08-15 NOTE — Telephone Encounter (Signed)
Left message for patient to call back and schedule Medicare Annual Wellness Visit (AWV) either virtually or in office.   Last AWV no information please schedule at anytime with [facility] Nurse Health Advisor 1 or 2   This should be a 45 minute visit. 

## 2020-08-21 IMAGING — MG DIGITAL SCREENING BILATERAL MAMMOGRAM WITH TOMO AND CAD
8 of 16 series · 8 of 40 positions shown · non-contrast
Comparison: Previous exam(s).

CLINICAL DATA: Screening.

EXAM:
DIGITAL SCREENING BILATERAL MAMMOGRAM WITH TOMO AND CAD

[R CV synth-2D]
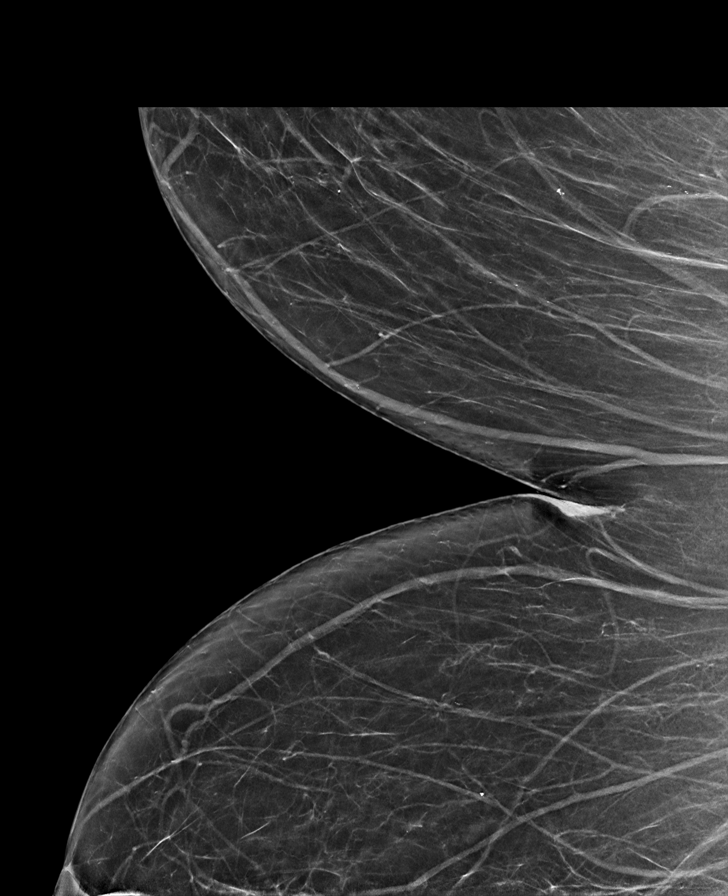

[L MLO synth-2D (1 of 2)]
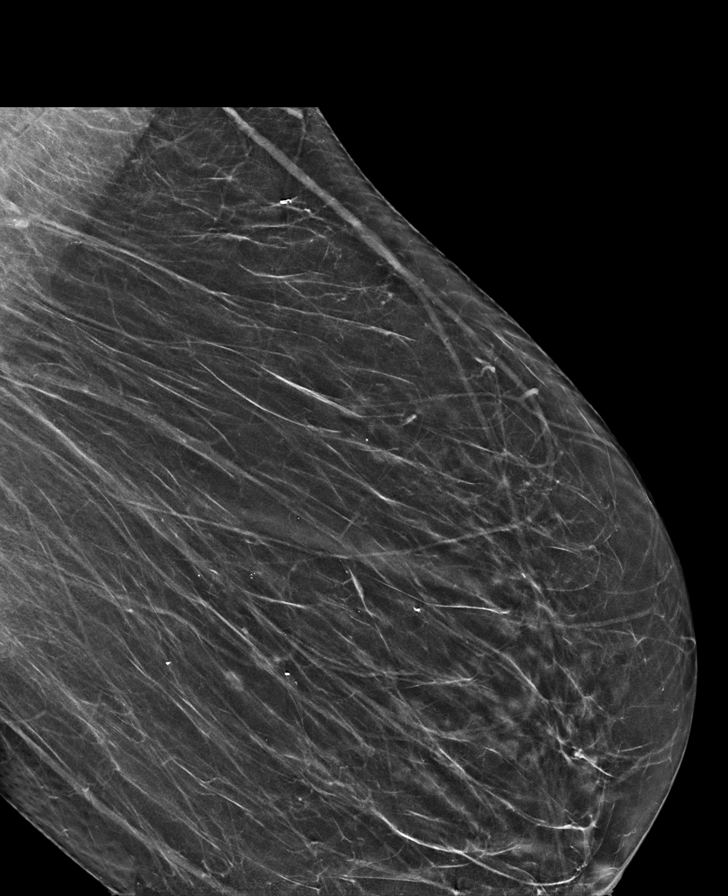

[R CC synth-2D (1 of 2)]
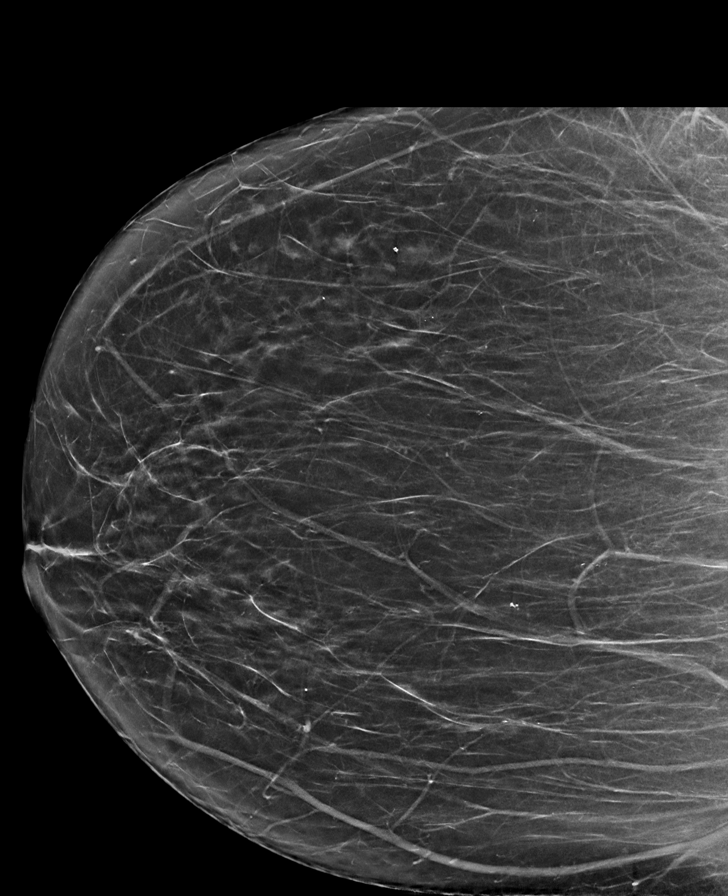

[R CC synth-2D (2 of 2)]
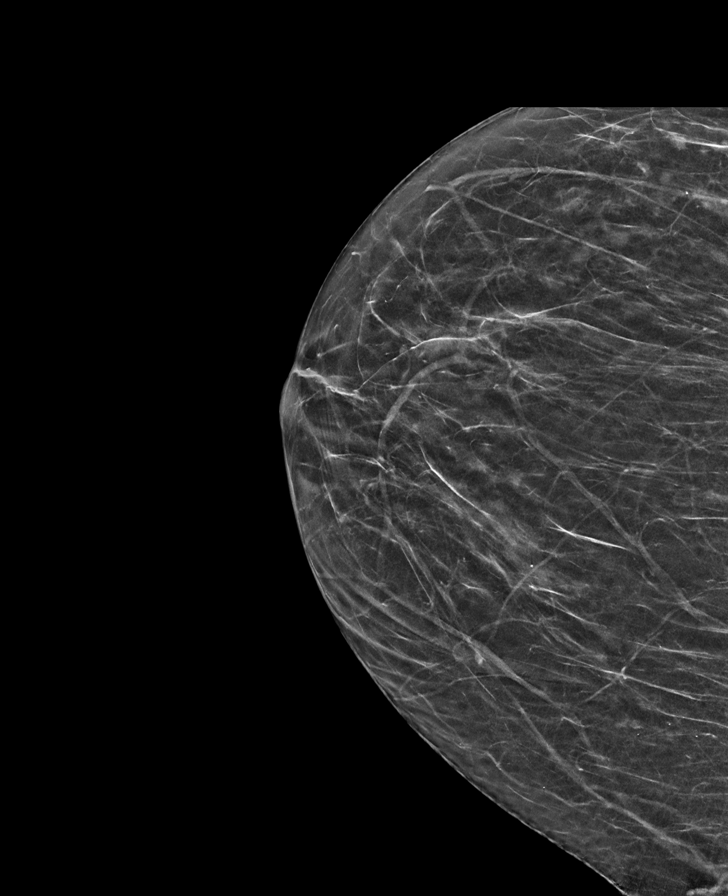

[L MLO synth-2D (2 of 2)]
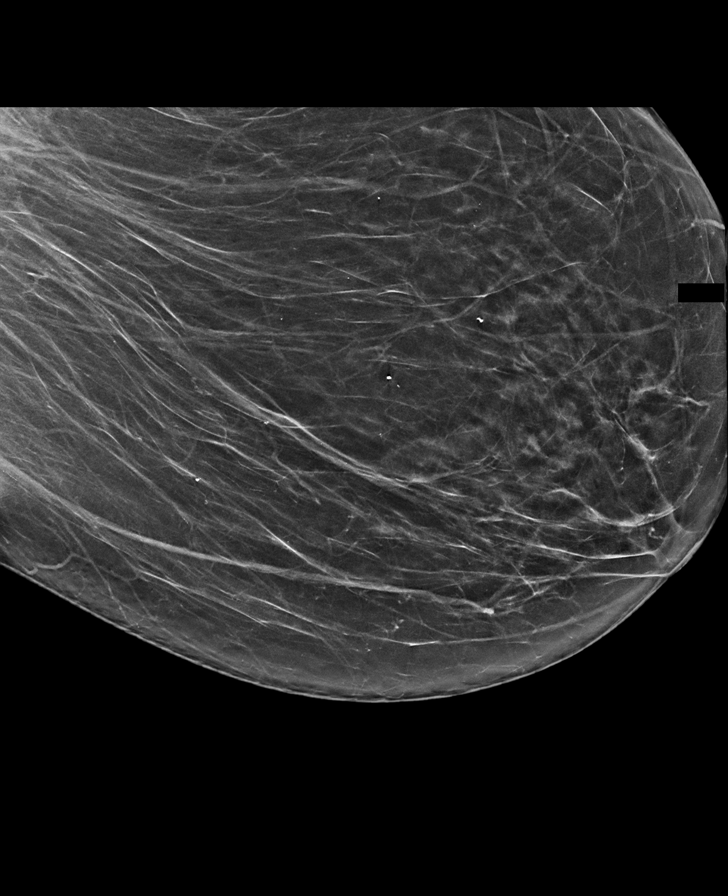

[L CC synth-2D]
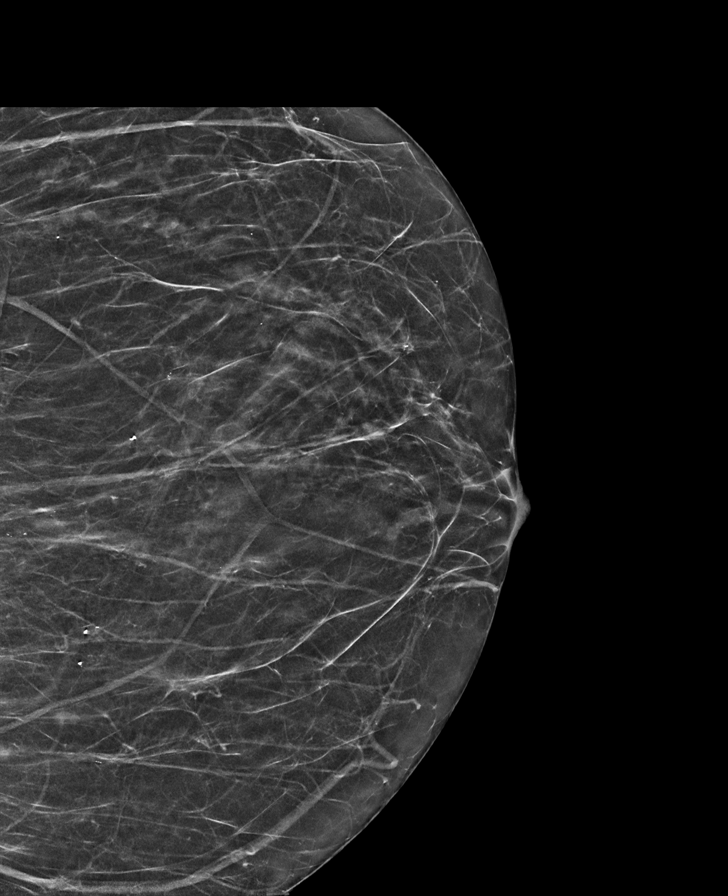

[R MLO synth-2D]
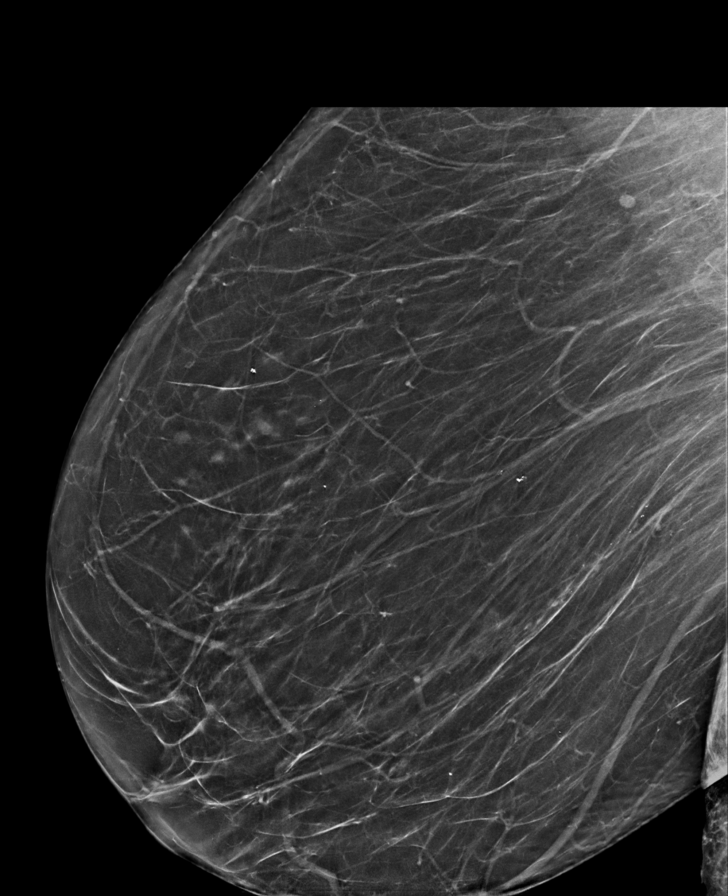

[R CV tomo · tomo slice 33/65.0]
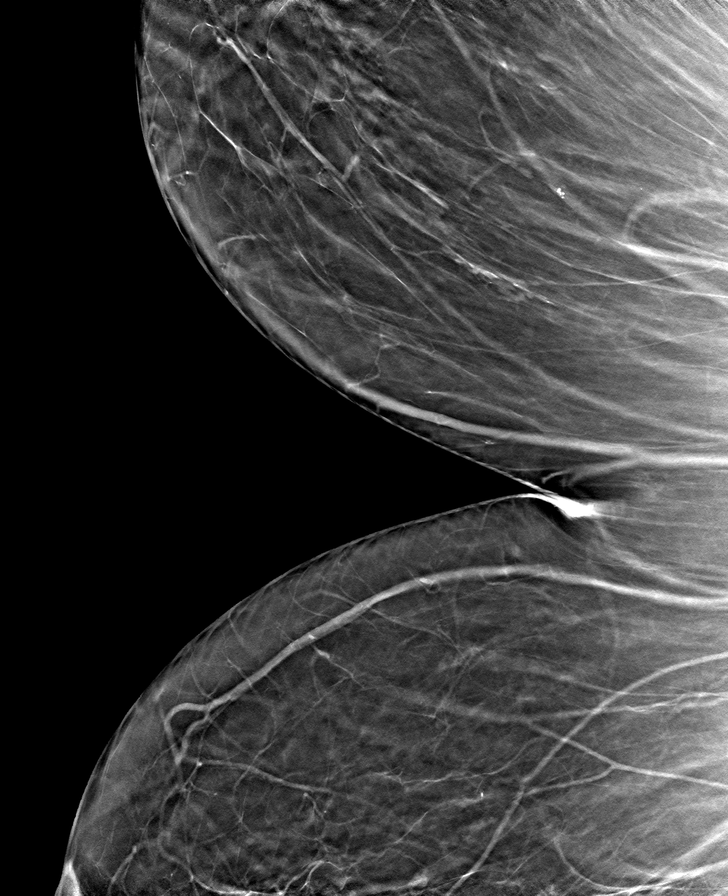

[8 of 40 positions shown; findings below may reference images not displayed]

ACR Breast Density Category b: There are scattered areas of
fibroglandular density.
FINDINGS: There are no findings suspicious for malignancy. Images were
processed with CAD.
IMPRESSION: No mammographic evidence of malignancy. A result letter of this
screening mammogram will be mailed directly to the patient.

RECOMMENDATION:
Screening mammogram in one year. (Code:[id number])

BI-RADS CATEGORY  1: Negative.

## 2020-09-27 ENCOUNTER — Other Ambulatory Visit: Payer: Self-pay | Admitting: Family Medicine

## 2020-10-26 ENCOUNTER — Telehealth: Payer: Self-pay | Admitting: Family Medicine

## 2020-10-26 NOTE — Telephone Encounter (Signed)
Left message for patient to call back and schedule Medicare Annual Wellness Visit (AWV) either virtually or in office. No detailed message left   AWVI  please schedule at anytime with [facility] Nurse Health Advisor 1 or 2   This should be a 45 minute visit. 

## 2020-10-29 DIAGNOSIS — Z96611 Presence of right artificial shoulder joint: Secondary | ICD-10-CM | POA: Diagnosis not present

## 2020-11-04 ENCOUNTER — Telehealth: Payer: Self-pay | Admitting: Gastroenterology

## 2020-11-04 NOTE — Telephone Encounter (Signed)
Spoke with patient in regards to being unable to provide the letter of excuse. Advised patient that she can ask her PCP. Patient states that she will call at a later date to set up an appointment.

## 2020-11-04 NOTE — Telephone Encounter (Signed)
Patient is requesting a letter to get excused from jury duty please advise.

## 2020-11-04 NOTE — Telephone Encounter (Signed)
Dr. Loletha Carrow, please see patient's request below. Patient has not been seen since 2017. Thanks

## 2020-11-04 NOTE — Telephone Encounter (Signed)
Politely declined

## 2020-11-07 ENCOUNTER — Telehealth: Payer: Self-pay | Admitting: Family Medicine

## 2020-11-07 NOTE — Telephone Encounter (Signed)
Left message for patient to call back and schedule Medicare Annual Wellness Visit (AWV) either virtually or in office. No detailed message left   AWVI  please schedule at anytime with [facility] Nurse Health Advisor 1 or 2   This should be a 45 minute visit. 

## 2020-12-26 ENCOUNTER — Other Ambulatory Visit: Payer: Self-pay | Admitting: Family Medicine

## 2021-01-26 ENCOUNTER — Telehealth: Payer: Self-pay | Admitting: Family Medicine

## 2021-01-26 NOTE — Telephone Encounter (Signed)
Left message for patient to call back and schedule Medicare Annual Wellness Visit (AWV) either virtually or in office.  awvi per [place] 03/03/14  please schedule at anytime with [facility] Nurse Health Advisor 1 or 2   This should be a 45 minute visit.

## 2021-01-27 ENCOUNTER — Other Ambulatory Visit: Payer: Self-pay | Admitting: Family Medicine

## 2021-01-27 DIAGNOSIS — Z1231 Encounter for screening mammogram for malignant neoplasm of breast: Secondary | ICD-10-CM

## 2021-03-10 ENCOUNTER — Telehealth: Payer: Self-pay | Admitting: Family Medicine

## 2021-03-10 NOTE — Telephone Encounter (Signed)
Left message for patient to call back and schedule Medicare Annual Wellness Visit (AWV) either virtually or in office.   awvi per [place] 03/03/14 please schedule at anytime with [facility] Nurse Health Advisor 1 or 2   This should be a 45 minute visit.

## 2021-03-26 ENCOUNTER — Other Ambulatory Visit: Payer: Self-pay | Admitting: Family Medicine

## 2021-03-27 ENCOUNTER — Ambulatory Visit
Admission: RE | Admit: 2021-03-27 | Discharge: 2021-03-27 | Disposition: A | Discharge status: Home / Self Care | Payer: Medicare HMO | Source: Ambulatory Visit | Attending: Family Medicine | Admitting: Family Medicine

## 2021-03-27 ENCOUNTER — Other Ambulatory Visit: Payer: Self-pay

## 2021-03-27 DIAGNOSIS — Z1231 Encounter for screening mammogram for malignant neoplasm of breast: Secondary | ICD-10-CM

## 2021-04-06 ENCOUNTER — Telehealth: Payer: Self-pay | Admitting: Family Medicine

## 2021-04-06 NOTE — Telephone Encounter (Signed)
Left message for patient to call back and schedule Medicare Annual Wellness Visit (AWV) either virtually or in office.   awvi per [place] 03/03/14  please schedule at anytime with [facility] Nurse Health Advisor 1 or 2   This should be a 45 minute visit.

## 2021-05-10 ENCOUNTER — Encounter: Payer: Self-pay | Admitting: Gastroenterology

## 2021-05-16 DIAGNOSIS — H4321 Crystalline deposits in vitreous body, right eye: Secondary | ICD-10-CM | POA: Diagnosis not present

## 2021-05-16 DIAGNOSIS — H52203 Unspecified astigmatism, bilateral: Secondary | ICD-10-CM | POA: Diagnosis not present

## 2021-05-16 DIAGNOSIS — Z961 Presence of intraocular lens: Secondary | ICD-10-CM | POA: Diagnosis not present

## 2021-05-16 DIAGNOSIS — H524 Presbyopia: Secondary | ICD-10-CM | POA: Diagnosis not present

## 2021-05-18 ENCOUNTER — Telehealth: Payer: Self-pay | Admitting: Family Medicine

## 2021-05-18 NOTE — Telephone Encounter (Signed)
Left message for patient to call back and schedule Medicare Annual Wellness Visit (AWV) either virtually or in office. Left  my jabber number [phone]    awvi per [place] 03/03/14  please schedule at anytime with [facility] Nurse Health Advisor 1 or 2   This should be a 45 minute visit.  

## 2021-06-20 ENCOUNTER — Ambulatory Visit (AMBULATORY_SURGERY_CENTER): Payer: Medicare HMO

## 2021-06-20 ENCOUNTER — Other Ambulatory Visit: Payer: Self-pay

## 2021-06-20 VITALS — Ht 63.0 in | Wt 239.0 lb

## 2021-06-20 DIAGNOSIS — Z86010 Personal history of colonic polyps: Secondary | ICD-10-CM

## 2021-06-20 MED ORDER — PLENVU 140 G PO SOLR: 1.0000 | ORAL | 0 refills | Status: DC

## 2021-06-20 NOTE — Progress Notes (Signed)
Pre visit completed via phone call; Patient verified name, DOB, and address; No egg or soy allergy known to patient  No issues known to pt with past sedation with any surgeries or procedures Patient denies ever being told they had issues or difficulty with intubation  No FH of Malignant Hyperthermia Pt is not on diet pills Pt is not on home 02  Pt is not on blood thinners  Pt denies issues with constipation at this time;  No A fib or A flutter Pt is fully vaccinated for Covid x 2; Medicare coupon given to pt in [place] today,and NO PA's for preps discussed with pt in [place] today  Discussed with pt there will be an out-of-pocket cost for prep and that varies from $0 to 70 +  dollars - pt verbalized understanding  Due to the COVID-19 pandemic we are asking patients to follow certain guidelines in [place] and the [place]   Pt aware of COVID protocols and [facility] guidelines

## 2021-06-24 ENCOUNTER — Other Ambulatory Visit: Payer: Self-pay | Admitting: Family Medicine

## 2021-07-06 ENCOUNTER — Ambulatory Visit (AMBULATORY_SURGERY_CENTER): Payer: Medicare HMO | Admitting: Gastroenterology

## 2021-07-06 ENCOUNTER — Other Ambulatory Visit: Payer: Self-pay

## 2021-07-06 ENCOUNTER — Encounter: Payer: Self-pay | Admitting: Gastroenterology

## 2021-07-06 VITALS — BP 125/59 | HR 72 | Temp 97.1°F | Resp 18 | Ht 63.0 in | Wt 239.0 lb

## 2021-07-06 DIAGNOSIS — D122 Benign neoplasm of ascending colon: Secondary | ICD-10-CM

## 2021-07-06 DIAGNOSIS — Z86010 Personal history of colonic polyps: Secondary | ICD-10-CM | POA: Diagnosis not present

## 2021-07-06 DIAGNOSIS — D123 Benign neoplasm of transverse colon: Secondary | ICD-10-CM | POA: Diagnosis not present

## 2021-07-06 DIAGNOSIS — I10 Essential (primary) hypertension: Secondary | ICD-10-CM | POA: Diagnosis not present

## 2021-07-06 MED ORDER — SODIUM CHLORIDE 0.9 % IV SOLN: 500.0000 mL | Freq: Once | INTRAVENOUS | Status: DC

## 2021-07-06 NOTE — Progress Notes (Signed)
History and Physical:  This patient presents for endoscopic testing for: Encounter Diagnosis  Name Primary?   Personal history of colonic polyps Yes    TA polyp last colonoscopy 07/2016 Patient denies chronic abdominal pain, rectal bleeding, constipation or diarrhea.  ROS: Patient denies chest pain or cough   Past Medical History: Past Medical History:  Diagnosis Date   Anemia    SOME after HER KNEE SURGERY   Arthritis    generalized   Cataract    bilateral sx   Colon polyps    Diverticulosis    History of kidney stones    Hypertension    on meds   Irritable bowel    PONV (postoperative nausea and vomiting)    WITH 2ND KNEE SURGERY, HAD SOME N/V   Stiffness of joint, shoulder region    left   Tinnitus    both ears most of the time   Varicose veins    both legs     Past Surgical History: Past Surgical History:  Procedure Laterality Date   ABDOMINAL HYSTERECTOMY  02/09/2010   TAH   BREAST BIOPSY Left 2018   Fibroadenoma   CATARACT EXTRACTION W/PHACO Right 05/17/2015   Procedure: CATARACT EXTRACTION PHACO AND INTRAOCULAR LENS PLACEMENT ([place]);  Surgeon: Rutherford Guys, MD;  Location: [facility];  Service: Ophthalmology;  Laterality: Right;  CDE:6.12   CATARACT EXTRACTION W/PHACO Left 05/31/2015   Procedure: CATARACT EXTRACTION PHACO AND INTRAOCULAR LENS PLACEMENT ([facility]);  Surgeon: Rutherford Guys, MD;  Location: [facility];  Service: Ophthalmology;  Laterality: Left;  CDE: 6.27   COLONOSCOPY  2017   HD-suprep(good)-tics/TA x 1   EYE SURGERY     FOOT SURGERY Bilateral    TOTAL KNEE ARTHROPLASTY  11/05/2011   Procedure: TOTAL KNEE ARTHROPLASTY;  Surgeon: Gearlean Alf, MD;  Location: [facility];  Service: Orthopedics;  Laterality: Left;   TOTAL KNEE ARTHROPLASTY Right 02/09/2013   Procedure: RIGHT TOTAL KNEE ARTHROPLASTY;  Surgeon: Gearlean Alf, MD;  Location: [facility];  Service: Orthopedics;  Laterality: Right;   TOTAL SHOULDER ARTHROPLASTY Right 07/24/2018   Procedure: RIGHT  TOTAL SHOULDER ARTHROPLASTY;  Surgeon: Justice Britain, MD;  Location: [facility];  Service: Orthopedics;  Laterality: Right;  153min    Allergies: Allergies  Allergen Reactions   Cefuroxime Rash   Cefoxitin Hives and Rash   Cefuroxime Axetil Hives and Rash     ceftin=hives    Outpatient Meds: Current Outpatient Medications  Medication Sig Dispense Refill   amLODipine (NORVASC) 5 MG tablet TAKE 1 TABLET(5 MG) BY MOUTH AT BEDTIME 90 tablet 0   calcium-vitamin D (OSCAL WITH D) 250-125 MG-UNIT tablet Take 2 tablets by mouth 2 (two) times daily.     Multiple Vitamin (MULTIVITAMIN PO) Take 2 tablets by mouth daily at 6 (six) AM.     triamcinolone (KENALOG) 0.025 % ointment Apply 1 application topically 2 (two) times daily as needed. 45 g 2   valsartan-hydrochlorothiazide (DIOVAN-HCT) 160-25 MG tablet TAKE 1 TABLET BY MOUTH DAILY 90 tablet 0   Current Facility-Administered Medications  Medication Dose Route Frequency Provider Last Rate Last Admin   0.9 %  sodium chloride infusion  500 mL Intravenous Once Nelida Meuse III, MD          ___________________________________________________________________ Objective   Exam:  BP 140/62   Pulse 82   Temp (!) 97.1 F (36.2 C) (Skin)   Ht 5\' 3"  (1.6 m)   Wt 239 lb (108.4 kg)   SpO2 98%  BMI 42.34 kg/m   CV: RRR without murmur, S1/S2 Resp: clear to auscultation bilaterally, normal RR and effort noted GI: soft, no tenderness, with active bowel sounds.   Assessment: Encounter Diagnosis  Name Primary?   Personal history of colonic polyps Yes     Plan: Colonoscopy  The benefits and risks of the planned procedure were described in detail with the patient or (when appropriate) their health care proxy.  Risks were outlined as including, but not limited to, bleeding, infection, perforation, adverse medication reaction leading to cardiac or pulmonary decompensation, pancreatitis (if ERCP).  The limitation of incomplete mucosal  visualization was also discussed.  No guarantees or warranties were given.    The patient is appropriate for an endoscopic procedure in the ambulatory setting.   - Wilfrid Lund, MD

## 2021-07-06 NOTE — Patient Instructions (Signed)
Handout on polyps and diverticulosis given    YOU HAD AN ENDOSCOPIC PROCEDURE TODAY AT THE [facility]:   Refer to the procedure report that was given to you for any specific questions about what was found during the examination.  If the procedure report does not answer your questions, please call your gastroenterologist to clarify.  If you requested that your care partner not be given the details of your procedure findings, then the procedure report has been included in a sealed envelope for you to review at your convenience later.  YOU SHOULD EXPECT: Some feelings of bloating in the abdomen. Passage of more gas than usual.  Walking can help get rid of the air that was put into your GI tract during the procedure and reduce the bloating. If you had a lower endoscopy (such as a colonoscopy or flexible sigmoidoscopy) you may notice spotting of blood in your stool or on the toilet paper. If you underwent a bowel prep for your procedure, you may not have a normal bowel movement for a few days.  Please Note:  You might notice some irritation and congestion in your nose or some drainage.  This is from the oxygen used during your procedure.  There is no need for concern and it should clear up in a day or so.  SYMPTOMS TO REPORT IMMEDIATELY:   Following lower endoscopy (colonoscopy or flexible sigmoidoscopy):  Excessive amounts of blood in the stool  Significant tenderness or worsening of abdominal pains  Swelling of the abdomen that is new, acute  Fever of 100F or higher   For urgent or emergent issues, a gastroenterologist can be reached at any hour by calling [phone]. Do not use MyChart messaging for urgent concerns.    DIET:  We do recommend a small meal at first, but then you may proceed to your regular diet.  Drink plenty of fluids but you should avoid alcoholic beverages for 24 hours.  ACTIVITY:  You should plan to take it easy for the rest of today and you should NOT  DRIVE or use heavy machinery until tomorrow (because of the sedation medicines used during the test).    FOLLOW UP: Our staff will call the number listed on your records 48-72 hours following your procedure to check on you and address any questions or concerns that you may have regarding the information given to you following your procedure. If we do not reach you, we will leave a message.  We will attempt to reach you two times.  During this call, we will ask if you have developed any symptoms of COVID 19. If you develop any symptoms (ie: fever, flu-like symptoms, shortness of breath, cough etc.) before then, please call [phone].  If you test positive for Covid 19 in the 2 weeks post procedure, please call and report this information to us.    If any biopsies were taken you will be contacted by phone or by letter within the next 1-3 weeks.  Please call us at [phone] if you have not heard about the biopsies in 3 weeks.    SIGNATURES/CONFIDENTIALITY: You and/or your care partner have signed paperwork which will be entered into your electronic medical record.  These signatures attest to the fact that that the information above on your After Visit Summary has been reviewed and is understood.  Full responsibility of the confidentiality of this discharge information lies with you and/or your care-partner. 

## 2021-07-06 NOTE — Progress Notes (Signed)
Called to room to assist during endoscopic procedure.  Patient ID and intended procedure confirmed with present staff. Received instructions for my participation in the procedure from the performing physician.  

## 2021-07-06 NOTE — Progress Notes (Signed)
Report given to PACU, vss 

## 2021-07-06 NOTE — Op Note (Signed)
[facility] Patient Name: Carla Schmitt Procedure Date: 07/06/2021 2:17 PM MRN: [id number] Endoscopist: Mallie Mussel L. Loletha Carrow , MD Age: 75 Referring MD:  Date of Birth: 01-11-1946 Gender: Female Account #: [id number] Procedure:                Colonoscopy Indications:              Surveillance: Personal history of adenomatous                            polyps on last colonoscopy 5 years ago Medicines:                Monitored Anesthesia Care Procedure:                Pre-Anesthesia Assessment:                           - Prior to the procedure, a History and Physical                            was performed, and patient medications and                            allergies were reviewed. The patient's tolerance of                            previous anesthesia was also reviewed. The risks                            and benefits of the procedure and the sedation                            options and risks were discussed with the patient.                            All questions were answered, and informed consent                            was obtained. Prior Anticoagulants: The patient has                            taken no previous anticoagulant or antiplatelet                            agents. ASA Grade Assessment: III - A patient with                            severe systemic disease. After reviewing the risks                            and benefits, the patient was deemed in                            satisfactory condition to undergo the procedure.  After obtaining informed consent, the colonoscope                            was passed under direct vision. Throughout the                            procedure, the patient's blood pressure, pulse, and                            oxygen saturations were monitored continuously. The                            PCF-HQ190L Colonoscope was introduced through the                            anus and advanced to  the the cecum, identified by                            appendiceal orifice and ileocecal valve. The                            colonoscopy was somewhat difficult due to multiple                            diverticula in the colon and a redundant colon.                            Successful completion of the procedure was aided by                            using manual pressure and straightening and                            shortening the scope to obtain bowel loop                            reduction. The patient tolerated the procedure                            well. The quality of the bowel preparation was                            good. The ileocecal valve, appendiceal orifice, and                            rectum were photographed. Scope In: 2:29:39 PM Scope Out: 2:40:31 PM Scope Withdrawal Time: 0 hours 6 minutes 27 seconds  Total Procedure Duration: 0 hours 10 minutes 52 seconds  Findings:                 The perianal and digital rectal examinations were                            normal.  Three sessile polyps were found in the transverse                            colon and ascending colon. The polyps were 2 to 4                            mm in size. These polyps were removed with a cold                            snare. Resection and retrieval were complete.                           Multiple diverticula were found in the left colon.                           Internal hemorrhoids were found.                           The exam was otherwise without abnormality on                            direct and retroflexion views. Complications:            No immediate complications. Estimated Blood Loss:     Estimated blood loss was minimal. Impression:               - Three 2 to 4 mm polyps in the transverse colon                            and in the ascending colon, removed with a cold                            snare. Resected and retrieved.                            - Diverticulosis in the left colon.                           - Internal hemorrhoids.                           - The examination was otherwise normal on direct                            and retroflexion views. Recommendation:           - Patient has a contact number available for                            emergencies. The signs and symptoms of potential                            delayed complications were discussed with the  patient. Return to normal activities tomorrow.                            Written discharge instructions were provided to the                            patient.                           - Resume previous diet.                           - Continue present medications.                           - Await pathology results.                           - Repeat colonoscopy is recommended for                            surveillance. The colonoscopy date will be                            determined after pathology results from today's                            exam become available for review. Kele Barthelemy L. Loletha Carrow, MD 07/06/2021 2:46:14 PM This report has been signed electronically.

## 2021-07-06 NOTE — Progress Notes (Signed)
Pt's states no medical or surgical changes since previsit or office visit. VS assessed by D.T 

## 2021-07-10 ENCOUNTER — Telehealth: Payer: Self-pay

## 2021-07-10 NOTE — Telephone Encounter (Signed)
  Follow up Call-  Call back number 07/06/2021  Post procedure Call Back phone  # [phone]  Permission to leave phone message Yes  Some recent data might be hidden     Patient questions:  Do you have a fever, pain , or abdominal swelling? No. Pain Score  0 *  Have you tolerated food without any problems? Yes.    Have you been able to return to your normal activities? Yes.    Do you have any questions about your discharge instructions: Diet   No. Medications  No. Follow up visit  No.  Do you have questions or concerns about your Care? No.  Actions: * If pain score is 4 or above: No action needed, pain <4.  Have you developed a fever since your procedure? no  2.   Have you had an respiratory symptoms (SOB or cough) since your procedure? no  3.   Have you tested positive for COVID 19 since your procedure no  4.   Have you had any family members/close contacts diagnosed with the COVID 19 since your procedure?  no   If yes to any of these questions please route to Joylene John, RN and Joella Prince, RN

## 2021-07-14 ENCOUNTER — Encounter: Payer: Self-pay | Admitting: Gastroenterology

## 2021-07-19 ENCOUNTER — Telehealth: Payer: Self-pay

## 2021-07-19 NOTE — Telephone Encounter (Signed)
Last OV for preventative care on 07/08/20.  LVM instructions for pt to return call to schedule annual appt with PCP.

## 2021-08-01 ENCOUNTER — Ambulatory Visit (INDEPENDENT_AMBULATORY_CARE_PROVIDER_SITE_OTHER): Payer: Medicare HMO | Admitting: Family Medicine

## 2021-08-01 VITALS — BP 152/78 | HR 77 | Temp 98.1°F | Ht 63.5 in | Wt 239.4 lb

## 2021-08-01 DIAGNOSIS — Z0000 Encounter for general adult medical examination without abnormal findings: Secondary | ICD-10-CM | POA: Diagnosis not present

## 2021-08-01 LAB — CBC WITH DIFFERENTIAL/PLATELET
Basophils Absolute: 0 10*3/uL (ref 0.0–0.1)
Basophils Relative: 0.8 % (ref 0.0–3.0)
Eosinophils Absolute: 0.1 10*3/uL (ref 0.0–0.7)
Eosinophils Relative: 2.2 % (ref 0.0–5.0)
HCT: 37.2 % (ref 36.0–46.0)
Hemoglobin: 12.1 g/dL (ref 12.0–15.0)
Lymphocytes Relative: 37.1 % (ref 12.0–46.0)
Lymphs Abs: 2.2 10*3/uL (ref 0.7–4.0)
MCHC: 32.6 g/dL (ref 30.0–36.0)
MCV: 84.8 fl (ref 78.0–100.0)
Monocytes Absolute: 0.3 10*3/uL (ref 0.1–1.0)
Monocytes Relative: 5.5 % (ref 3.0–12.0)
Neutro Abs: 3.3 10*3/uL (ref 1.4–7.7)
Neutrophils Relative %: 54.4 % (ref 43.0–77.0)
Platelets: 298 10*3/uL (ref 150.0–400.0)
RBC: 4.38 Mil/uL (ref 3.87–5.11)
RDW: 15.3 % (ref 11.5–15.5)
WBC: 6 10*3/uL (ref 4.0–10.5)

## 2021-08-01 LAB — LIPID PANEL
Cholesterol: 187 mg/dL (ref 0–200)
HDL: 71.3 mg/dL (ref >39.00)
LDL Cholesterol: 82 mg/dL (ref 0–99)
NonHDL: 115.92
Total CHOL/HDL Ratio: 3
Triglycerides: 172 mg/dL — ABNORMAL HIGH (ref 0.0–149.0)
VLDL: 34.4 mg/dL (ref 0.0–40.0)

## 2021-08-01 LAB — HEPATIC FUNCTION PANEL
ALT: 11 U/L (ref 0–35)
AST: 15 U/L (ref 0–37)
Albumin: 4.5 g/dL (ref 3.5–5.2)
Alkaline Phosphatase: 59 U/L (ref 39–117)
Bilirubin, Direct: 0 mg/dL (ref 0.0–0.3)
Total Bilirubin: 0.4 mg/dL (ref 0.2–1.2)
Total Protein: 7.8 g/dL (ref 6.0–8.3)

## 2021-08-01 LAB — BASIC METABOLIC PANEL
BUN: 16 mg/dL (ref 6–23)
CO2: 30 mEq/L (ref 19–32)
Calcium: 10.7 mg/dL — ABNORMAL HIGH (ref 8.4–10.5)
Chloride: 101 mEq/L (ref 96–112)
Creatinine, Ser: 0.89 mg/dL (ref 0.40–1.20)
GFR: 64.34 mL/min (ref >60.00)
Glucose, Bld: 95 mg/dL (ref 70–99)
Potassium: 3.1 mEq/L — ABNORMAL LOW (ref 3.5–5.1)
Sodium: 140 mEq/L (ref 135–145)

## 2021-08-01 LAB — HEMOGLOBIN A1C: Hgb A1c MFr Bld: 5.7 % (ref 4.6–6.5)

## 2021-08-01 LAB — TSH: TSH: 1.57 u[IU]/mL (ref 0.35–5.50)

## 2021-08-01 MED ORDER — TRIAMCINOLONE ACETONIDE 0.025 % EX OINT: 1.0000 "application " | TOPICAL_OINTMENT | Freq: Two times a day (BID) | CUTANEOUS | 2 refills | Status: DC | PRN

## 2021-08-01 NOTE — Patient Instructions (Signed)
Monitor blood pressure and be in touch if consistently > 140/90.   

## 2021-08-01 NOTE — Progress Notes (Signed)
Established Patient Office Visit  Subjective:  Patient ID: Carla Schmitt, female    DOB: [birth year]  Age: 75 y.o. MRN: [id number]  CC:  Chief Complaint  Patient presents with   Annual Exam    HPI Carla Schmitt presents for physical exam.  She has hypertension treated with Diovan HCTZ and amlodipine.  Compliant with therapy.  She states she ate some ham over the holidays and wonders if that may have driven up her blood pressure.  Her blood pressures generally been well controlled.  She had recent colonoscopy and blood pressure was stable then.  Health maintenance reviewed  -Flu vaccine already given -Prior hepatitis C screening negative -Pneumonia vaccines complete -Colonoscopy just done a few weeks ago.  This will be due in 3 years -She had mammogram 7/22 -Tetanus due next year -No history of shingles vaccine.  Social history-she is married.  This is second marriage and she has been married 72 years.  She has 2 children.  She has son lives in [place] and daughter who lives here.  3 grandchildren.  Quit smoking 1992.  No alcohol use.  Family history-multiple family members with hypertension.  Brother with type 2 diabetes.  Brother and father had CAD in their late 65s.  Mother had Alzheimer's dementia-late onset  Past Medical History:  Diagnosis Date   Anemia    SOME after HER KNEE SURGERY   Arthritis    generalized   Cataract    bilateral sx   Colon polyps    Diverticulosis    History of kidney stones    Hypertension    on meds   Irritable bowel    PONV (postoperative nausea and vomiting)    WITH 2ND KNEE SURGERY, HAD SOME N/V   Stiffness of joint, shoulder region    left   Tinnitus    both ears most of the time   Varicose veins    both legs    Past Surgical History:  Procedure Laterality Date   ABDOMINAL HYSTERECTOMY  02/09/2010   TAH   BREAST BIOPSY Left 2018   Fibroadenoma   CATARACT EXTRACTION W/PHACO Right 05/17/2015   Procedure: CATARACT EXTRACTION  PHACO AND INTRAOCULAR LENS PLACEMENT ([place]);  Surgeon: Rutherford Guys, MD;  Location: [facility];  Service: Ophthalmology;  Laterality: Right;  CDE:6.12   CATARACT EXTRACTION W/PHACO Left 05/31/2015   Procedure: CATARACT EXTRACTION PHACO AND INTRAOCULAR LENS PLACEMENT ([facility]);  Surgeon: Rutherford Guys, MD;  Location: [facility];  Service: Ophthalmology;  Laterality: Left;  CDE: 6.27   COLONOSCOPY  2017   HD-suprep(good)-tics/TA x 1   EYE SURGERY     FOOT SURGERY Bilateral    TOTAL KNEE ARTHROPLASTY  11/05/2011   Procedure: TOTAL KNEE ARTHROPLASTY;  Surgeon: Gearlean Alf, MD;  Location: [facility];  Service: Orthopedics;  Laterality: Left;   TOTAL KNEE ARTHROPLASTY Right 02/09/2013   Procedure: RIGHT TOTAL KNEE ARTHROPLASTY;  Surgeon: Gearlean Alf, MD;  Location: [facility];  Service: Orthopedics;  Laterality: Right;   TOTAL SHOULDER ARTHROPLASTY Right 07/24/2018   Procedure: RIGHT TOTAL SHOULDER ARTHROPLASTY;  Surgeon: Justice Britain, MD;  Location: [facility];  Service: Orthopedics;  Laterality: Right;  167min    Family History  Problem Relation Age of Onset   Colon polyps Mother    Dementia Mother    Heart disease Father    Hypertension Father    Stroke Father    Hypertension Sister    Diabetes Brother    Heart disease Brother  Hypertension Brother    Arthritis Other    Heart disease Other    Stroke Other    Colon cancer Neg Hx    Esophageal cancer Neg Hx    Rectal cancer Neg Hx    Stomach cancer Neg Hx     Social History   Socioeconomic History   Marital status: Married    Spouse name: Not on file   Number of children: 2   Years of education: Not on file   Highest education level: Not on file  Occupational History   Occupation: retired  Tobacco Use   Smoking status: Former    Packs/day: 1.50    Years: 12.00    Pack years: 18.00    Types: Cigarettes    Quit date: 06/04/1991    Years since quitting: 30.1   Smokeless tobacco: Never  Vaping Use   Vaping Use: Never used  Substance and  Sexual Activity   Alcohol use: No   Drug use: No   Sexual activity: Never    Birth control/protection: Surgical  Other Topics Concern   Not on file  Social History Narrative   Not on file   Social Determinants of Health   Financial Resource Strain: Not on file  Food Insecurity: Not on file  Transportation Needs: Not on file  Physical Activity: Not on file  Stress: Not on file  Social Connections: Not on file  Intimate Partner Violence: Not on file    Outpatient Medications Prior to Visit  Medication Sig Dispense Refill   amLODipine (NORVASC) 5 MG tablet TAKE 1 TABLET(5 MG) BY MOUTH AT BEDTIME 90 tablet 0   calcium-vitamin D (OSCAL WITH D) 250-125 MG-UNIT tablet Take 2 tablets by mouth 2 (two) times daily.     Multiple Vitamin (MULTIVITAMIN PO) Take 2 tablets by mouth daily at 6 (six) AM.     valsartan-hydrochlorothiazide (DIOVAN-HCT) 160-25 MG tablet TAKE 1 TABLET BY MOUTH DAILY 90 tablet 0   triamcinolone (KENALOG) 0.025 % ointment Apply 1 application topically 2 (two) times daily as needed. 45 g 2   No facility-administered medications prior to visit.    Allergies  Allergen Reactions   Cefuroxime Rash   Cefoxitin Hives and Rash   Cefuroxime Axetil Hives and Rash     ceftin=hives    ROS Review of Systems  Constitutional:  Negative for activity change, appetite change, fatigue, fever and unexpected weight change.  HENT:  Negative for ear pain, hearing loss, sore throat and trouble swallowing.   Eyes:  Negative for visual disturbance.  Respiratory:  Negative for cough and shortness of breath.   Cardiovascular:  Negative for chest pain and palpitations.  Gastrointestinal:  Negative for abdominal pain, blood in stool, constipation and diarrhea.  Genitourinary:  Negative for dysuria and hematuria.  Musculoskeletal:  Positive for arthralgias. Negative for back pain and myalgias.  Skin:  Negative for rash.  Neurological:  Negative for dizziness, syncope and headaches.   Hematological:  Negative for adenopathy.  Psychiatric/Behavioral:  Negative for confusion and dysphoric mood.      Objective:    Physical Exam Constitutional:      Appearance: She is well-developed.  HENT:     Head: Normocephalic and atraumatic.  Eyes:     Pupils: Pupils are equal, round, and reactive to light.  Neck:     Thyroid: No thyromegaly.  Cardiovascular:     Rate and Rhythm: Normal rate and regular rhythm.     Heart sounds: Normal heart sounds. No murmur  heard. Pulmonary:     Effort: No respiratory distress.     Breath sounds: Normal breath sounds. No wheezing or rales.  Abdominal:     General: Bowel sounds are normal. There is no distension.     Palpations: Abdomen is soft. There is no mass.     Tenderness: There is no abdominal tenderness. There is no guarding or rebound.  Musculoskeletal:        General: Normal range of motion.     Cervical back: Normal range of motion and neck supple.     Right lower leg: No edema.     Left lower leg: No edema.  Lymphadenopathy:     Cervical: No cervical adenopathy.  Skin:    Findings: No rash.  Neurological:     Mental Status: She is alert and oriented to person, place, and time.     Cranial Nerves: No cranial nerve deficit.  Psychiatric:        Behavior: Behavior normal.        Thought Content: Thought content normal.        Judgment: Judgment normal.    BP (!) 152/78 (BP Location: Left Arm, Patient Position: Sitting, Cuff Size: Large)   Pulse 77   Temp 98.1 F (36.7 C) (Oral)   Ht 5' 3.5" (1.613 m)   Wt 239 lb 6.4 oz (108.6 kg)   SpO2 98%   BMI 41.74 kg/m  Wt Readings from Last 3 Encounters:  08/01/21 239 lb 6.4 oz (108.6 kg)  07/06/21 239 lb (108.4 kg)  06/20/21 239 lb (108.4 kg)     Health Maintenance Due  Topic Date Due   Zoster Vaccines- Shingrix (1 of 2) Never done   COVID-19 Vaccine (4 - Booster for Moderna series) 08/25/2020    There are no preventive care reminders to display for this  patient.  Lab Results  Component Value Date   TSH 1.52 07/08/2020   Lab Results  Component Value Date   WBC 6.2 07/08/2020   HGB 12.2 07/08/2020   HCT 37.2 07/08/2020   MCV 84.7 07/08/2020   PLT 322 07/08/2020   Lab Results  Component Value Date   NA 141 07/08/2020   K 3.9 07/08/2020   CO2 25 07/08/2020   GLUCOSE 101 (H) 07/08/2020   BUN 23 07/08/2020   CREATININE 1.05 (H) 07/08/2020   BILITOT 0.4 07/08/2020   ALKPHOS 76 06/30/2019   AST 18 07/08/2020   ALT 12 07/08/2020   PROT 7.2 07/08/2020   ALBUMIN 4.6 06/30/2019   CALCIUM 10.0 07/08/2020   ANIONGAP 9 07/17/2018   GFR 78.64 06/30/2019   Lab Results  Component Value Date   CHOL 157 07/08/2020   Lab Results  Component Value Date   HDL 74 07/08/2020   Lab Results  Component Value Date   LDLCALC 64 07/08/2020   Lab Results  Component Value Date   TRIG 109 07/08/2020   Lab Results  Component Value Date   CHOLHDL 2.1 07/08/2020   Lab Results  Component Value Date   HGBA1C 5.7 (H) 07/08/2020      Assessment & Plan:   Problem List Items Addressed This Visit   None Visit Diagnoses     Physical exam    -  Primary   Relevant Orders   Basic metabolic panel   Lipid panel   CBC with Differential/Platelet   Hepatic function panel   TSH   Hemoglobin A1c     Obtain labs as above.  Include  A1c with prior history of prediabetes range blood sugars. -We discussed Shingrix vaccine and she will check on insurance coverage if interested -Continue with annual flu vaccine -Mammogram and colonoscopy up-to-date -We recommend she monitor blood pressure at home next few weeks and be in touch if consistently greater than 974 systolic.  We will also schedule II month follow-up to reassess blood pressure.  If still up at that point consider additional medication -Handout on DASH diet given  Meds ordered this encounter  Medications   triamcinolone (KENALOG) 0.025 % ointment    Sig: Apply 1 application topically 2  (two) times daily as needed.    Dispense:  45 g    Refill:  2    Follow-up: Return in about 2 months (around 10/01/2021).    Carolann Littler, MD

## 2021-08-03 ENCOUNTER — Other Ambulatory Visit: Payer: Self-pay

## 2021-08-03 DIAGNOSIS — E876 Hypokalemia: Secondary | ICD-10-CM

## 2021-08-17 ENCOUNTER — Other Ambulatory Visit: Payer: Medicare HMO

## 2021-08-17 ENCOUNTER — Other Ambulatory Visit (INDEPENDENT_AMBULATORY_CARE_PROVIDER_SITE_OTHER): Payer: Medicare HMO

## 2021-08-17 DIAGNOSIS — E876 Hypokalemia: Secondary | ICD-10-CM | POA: Diagnosis not present

## 2021-08-17 LAB — BASIC METABOLIC PANEL
BUN: 27 mg/dL — ABNORMAL HIGH (ref 6–23)
CO2: 32 mEq/L (ref 19–32)
Calcium: 10.7 mg/dL — ABNORMAL HIGH (ref 8.4–10.5)
Chloride: 99 mEq/L (ref 96–112)
Creatinine, Ser: 0.92 mg/dL (ref 0.40–1.20)
GFR: 61.81 mL/min (ref >60.00)
Glucose, Bld: 92 mg/dL (ref 70–99)
Potassium: 3.5 mEq/L (ref 3.5–5.1)
Sodium: 140 mEq/L (ref 135–145)

## 2021-09-22 ENCOUNTER — Other Ambulatory Visit: Payer: Self-pay | Admitting: Family Medicine

## 2021-10-02 ENCOUNTER — Ambulatory Visit (INDEPENDENT_AMBULATORY_CARE_PROVIDER_SITE_OTHER): Payer: Medicare HMO | Admitting: Family Medicine

## 2021-10-02 VITALS — BP 150/68 | HR 80 | Temp 97.7°F | Wt 240.1 lb

## 2021-10-02 DIAGNOSIS — I10 Essential (primary) hypertension: Secondary | ICD-10-CM

## 2021-10-02 NOTE — Progress Notes (Signed)
Established Patient Office Visit  Subjective:  Patient ID: Carla Schmitt, female    DOB: Aug 30, 1946  Age: 76 y.o. MRN: [id number]  CC:  Chief Complaint  Patient presents with   Hypertension    HPI Carla Schmitt presents for follow-up hypertension.  Refer to recent note for detail.  She was seen here for physical.  Her labs revealed slightly low potassium 3.1 and she increase her dietary intake and repeat was 3.5.  She is currently on Diovan HCTZ and amlodipine 5 mg daily.  Compliant with medications.  Home blood pressures been fairly consistently around 025 systolic and 85I diastolic.  No recent peripheral edema.  No headaches.  No dizziness.  She is trying to watch sodium intake closely.  No alcohol use.  Past Medical History:  Diagnosis Date   Anemia    SOME after HER KNEE SURGERY   Arthritis    generalized   Cataract    bilateral sx   Colon polyps    Diverticulosis    History of kidney stones    Hypertension    on meds   Irritable bowel    PONV (postoperative nausea and vomiting)    WITH 2ND KNEE SURGERY, HAD SOME N/V   Stiffness of joint, shoulder region    left   Tinnitus    both ears most of the time   Varicose veins    both legs    Past Surgical History:  Procedure Laterality Date   ABDOMINAL HYSTERECTOMY  02/09/2010   TAH   BREAST BIOPSY Left 2018   Fibroadenoma   CATARACT EXTRACTION W/PHACO Right 05/17/2015   Procedure: CATARACT EXTRACTION PHACO AND INTRAOCULAR LENS PLACEMENT ([place]);  Surgeon: Rutherford Guys, MD;  Location: [facility];  Service: Ophthalmology;  Laterality: Right;  CDE:6.12   CATARACT EXTRACTION W/PHACO Left 05/31/2015   Procedure: CATARACT EXTRACTION PHACO AND INTRAOCULAR LENS PLACEMENT ([facility]);  Surgeon: Rutherford Guys, MD;  Location: [facility];  Service: Ophthalmology;  Laterality: Left;  CDE: 6.27   COLONOSCOPY  2017   HD-suprep(good)-tics/TA x 1   EYE SURGERY     FOOT SURGERY Bilateral    TOTAL KNEE ARTHROPLASTY  11/05/2011   Procedure: TOTAL  KNEE ARTHROPLASTY;  Surgeon: Gearlean Alf, MD;  Location: [facility];  Service: Orthopedics;  Laterality: Left;   TOTAL KNEE ARTHROPLASTY Right 02/09/2013   Procedure: RIGHT TOTAL KNEE ARTHROPLASTY;  Surgeon: Gearlean Alf, MD;  Location: [facility];  Service: Orthopedics;  Laterality: Right;   TOTAL SHOULDER ARTHROPLASTY Right 07/24/2018   Procedure: RIGHT TOTAL SHOULDER ARTHROPLASTY;  Surgeon: Justice Britain, MD;  Location: [facility];  Service: Orthopedics;  Laterality: Right;  167min    Family History  Problem Relation Age of Onset   Colon polyps Mother    Dementia Mother    Heart disease Father    Hypertension Father    Stroke Father    Hypertension Sister    Diabetes Brother    Heart disease Brother    Hypertension Brother    Arthritis Other    Heart disease Other    Stroke Other    Colon cancer Neg Hx    Esophageal cancer Neg Hx    Rectal cancer Neg Hx    Stomach cancer Neg Hx     Social History   Socioeconomic History   Marital status: Married    Spouse name: Not on file   Number of children: 2   Years of education: Not on file   Highest education level:  12th grade  Occupational History   Occupation: retired  Tobacco Use   Smoking status: Former    Packs/day: 1.50    Years: 12.00    Pack years: 18.00    Types: Cigarettes    Quit date: 06/04/1991    Years since quitting: 30.3   Smokeless tobacco: Never  Vaping Use   Vaping Use: Never used  Substance and Sexual Activity   Alcohol use: No   Drug use: No   Sexual activity: Never    Birth control/protection: Surgical  Other Topics Concern   Not on file  Social History Narrative   Not on file   Social Determinants of Health   Financial Resource Strain: Not on file  Food Insecurity: Not on file  Transportation Needs: No Transportation Needs   Lack of Transportation (Medical): No   Lack of Transportation (Non-Medical): No  Physical Activity: Not on file  Stress: No Stress Concern Present   Feeling of Stress :  Not at all  Social Connections: Socially Integrated   Frequency of Communication with Friends and Family: More than three times a week   Frequency of Social Gatherings with Friends and Family: Once a week   Attends Religious Services: More than 4 times per year   Active Member of [employer] or Organizations: Yes   Attends Music therapist: More than 4 times per year   Marital Status: Married  Human resources officer Violence: Not on file    Outpatient Medications Prior to Visit  Medication Sig Dispense Refill   amLODipine (NORVASC) 10 MG tablet Take 10 mg by mouth daily.     calcium-vitamin D (OSCAL WITH D) 250-125 MG-UNIT tablet Take 2 tablets by mouth 2 (two) times daily.     Multiple Vitamin (MULTIVITAMIN PO) Take 2 tablets by mouth daily at 6 (six) AM.     triamcinolone (KENALOG) 0.025 % ointment Apply 1 application topically 2 (two) times daily as needed. 45 g 2   valsartan-hydrochlorothiazide (DIOVAN-HCT) 160-25 MG tablet TAKE 1 TABLET BY MOUTH DAILY 90 tablet 0   amLODipine (NORVASC) 5 MG tablet TAKE 1 TABLET(5 MG) BY MOUTH AT BEDTIME 90 tablet 0   No facility-administered medications prior to visit.    Allergies  Allergen Reactions   Cefuroxime Rash   Cefoxitin Hives and Rash   Cefuroxime Axetil Hives and Rash     ceftin=hives    ROS Review of Systems  Constitutional:  Negative for fatigue.  Eyes:  Negative for visual disturbance.  Respiratory:  Negative for cough, chest tightness, shortness of breath and wheezing.   Cardiovascular:  Negative for chest pain, palpitations and leg swelling.  Neurological:  Negative for dizziness, seizures, syncope, weakness, light-headedness and headaches.     Objective:    Physical Exam Vitals reviewed.  Cardiovascular:     Rate and Rhythm: Normal rate and regular rhythm.  Pulmonary:     Effort: Pulmonary effort is normal.     Breath sounds: Normal breath sounds. No wheezing or rales.  Neurological:     Mental Status: She is  alert.    BP (!) 150/68 (BP Location: Left Arm, Patient Position: Sitting, Cuff Size: Normal)    Pulse 80    Temp 97.7 F (36.5 C) (Oral)    Wt 240 lb 1.6 oz (108.9 kg)    SpO2 99%    BMI 41.86 kg/m  Wt Readings from Last 3 Encounters:  10/02/21 240 lb 1.6 oz (108.9 kg)  08/01/21 239 lb 6.4 oz (108.6 kg)  07/06/21 239 lb (108.4 kg)     Health Maintenance Due  Topic Date Due   Zoster Vaccines- Shingrix (1 of 2) Never done   COVID-19 Vaccine (4 - Booster for Moderna series) 08/25/2020    There are no preventive care reminders to display for this patient.  Lab Results  Component Value Date   TSH 1.57 08/01/2021   Lab Results  Component Value Date   WBC 6.0 08/01/2021   HGB 12.1 08/01/2021   HCT 37.2 08/01/2021   MCV 84.8 08/01/2021   PLT 298.0 08/01/2021   Lab Results  Component Value Date   NA 140 08/17/2021   K 3.5 08/17/2021   CO2 32 08/17/2021   GLUCOSE 92 08/17/2021   BUN 27 (H) 08/17/2021   CREATININE 0.92 08/17/2021   BILITOT 0.4 08/01/2021   ALKPHOS 59 08/01/2021   AST 15 08/01/2021   ALT 11 08/01/2021   PROT 7.8 08/01/2021   ALBUMIN 4.5 08/01/2021   CALCIUM 10.7 (H) 08/17/2021   ANIONGAP 9 07/17/2018   GFR 61.81 08/17/2021   Lab Results  Component Value Date   CHOL 187 08/01/2021   Lab Results  Component Value Date   HDL 71.30 08/01/2021   Lab Results  Component Value Date   LDLCALC 82 08/01/2021   Lab Results  Component Value Date   TRIG 172.0 (H) 08/01/2021   Lab Results  Component Value Date   CHOLHDL 3 08/01/2021   Lab Results  Component Value Date   HGBA1C 5.7 08/01/2021      Assessment & Plan:   Hypertension.  Remains suboptimally controlled.  We will increase her amlodipine to 10 mg daily.  Continue current dose of valsartan and HCTZ.  Set up 54-month follow-up.  If not adequately controlled at that point consider additional medication.  We would like to see her below 130/80  No orders of the defined types were placed in  this encounter.   Follow-up: Return in about 3 months (around 12/31/2021).    Carolann Littler, MD

## 2021-10-02 NOTE — Patient Instructions (Signed)
Go ahead and increase the Amlodipine to 10 mg daily (take two of the 5 mg until they run out).   Let's set up 3 month follow up.

## 2021-11-17 ENCOUNTER — Other Ambulatory Visit: Payer: Self-pay | Admitting: Family Medicine

## 2021-11-17 MED ORDER — AMLODIPINE BESYLATE 10 MG PO TABS: 10.0000 mg | ORAL_TABLET | Freq: Every day | ORAL | 3 refills | Status: DC

## 2021-11-17 MED ORDER — VALSARTAN-HYDROCHLOROTHIAZIDE 160-25 MG PO TABS: 1.0000 | ORAL_TABLET | Freq: Every day | ORAL | 0 refills | Status: DC

## 2021-12-21 ENCOUNTER — Other Ambulatory Visit: Payer: Self-pay | Admitting: Family Medicine

## 2021-12-29 ENCOUNTER — Ambulatory Visit: Payer: Medicare HMO | Admitting: Family Medicine

## 2022-01-03 ENCOUNTER — Encounter: Payer: Self-pay | Admitting: Family Medicine

## 2022-01-03 ENCOUNTER — Ambulatory Visit (INDEPENDENT_AMBULATORY_CARE_PROVIDER_SITE_OTHER): Payer: Medicare HMO | Admitting: Family Medicine

## 2022-01-03 VITALS — BP 142/62 | HR 90 | Temp 97.4°F | Ht 63.5 in | Wt 240.8 lb

## 2022-01-03 DIAGNOSIS — R7303 Prediabetes: Secondary | ICD-10-CM

## 2022-01-03 DIAGNOSIS — I10 Essential (primary) hypertension: Secondary | ICD-10-CM

## 2022-01-03 DIAGNOSIS — R21 Rash and other nonspecific skin eruption: Secondary | ICD-10-CM | POA: Diagnosis not present

## 2022-01-03 MED ORDER — TRIAMCINOLONE ACETONIDE 0.025 % EX OINT: 1.0000 "application " | TOPICAL_OINTMENT | Freq: Two times a day (BID) | CUTANEOUS | 2 refills | Status: AC | PRN

## 2022-01-03 NOTE — Progress Notes (Signed)
? ?Established Patient Office Visit ? ?Subjective   ?Patient ID: Carla Schmitt, female    DOB: [birth year]  Age: 76 y.o. MRN: [id number] ? ?Chief Complaint  ?Patient presents with  ? Hypertension  ? ? ?HPI ? ? ?Patient here for medical follow-up.  She has hypertension currently treated with amlodipine 10 mg daily and valsartan HCTZ 160-25 mg 1 daily.  We had increased her amlodipine last visit to 10 mg daily.  She is tolerating well with no peripheral edema or dizziness or other side effects.  Her home blood pressures have been averaging around 146/72.  Last blood pressure here was 127 systolic.  Her weight is unchanged.  She knows she has some room to go with regard to improving diet.  She is not able to exercise much.  She does eat some high sodium snack foods and tends to snack late at night.  No alcohol use. ? ?She has recurrent rash under both breasts.  We had suggested trial of Lotrimin cream in the past which did not seem to help any.  She has previously been prescribed triamcinolone ointment which seemed to work very well for this rash.  They have been prescribed by dermatologist.  She is requesting refills. ? ?She does have history of prediabetes range blood sugars.  Last A1c 5.7%.  No polyuria or polydipsia.  Tries to watch sugar intake. ? ?Past Medical History:  ?Diagnosis Date  ? Anemia   ? SOME after HER KNEE SURGERY  ? Arthritis   ? generalized  ? Cataract   ? bilateral sx  ? Colon polyps   ? Diverticulosis   ? History of kidney stones   ? Hypertension   ? on meds  ? Irritable bowel   ? PONV (postoperative nausea and vomiting)   ? WITH 2ND KNEE SURGERY, HAD SOME N/V  ? Stiffness of joint, shoulder region   ? left  ? Tinnitus   ? both ears most of the time  ? Varicose veins   ? both legs  ? ?Past Surgical History:  ?Procedure Laterality Date  ? ABDOMINAL HYSTERECTOMY  02/09/2010  ? TAH  ? BREAST BIOPSY Left 2018  ? Fibroadenoma  ? CATARACT EXTRACTION W/PHACO Right 05/17/2015  ? Procedure: CATARACT  EXTRACTION PHACO AND INTRAOCULAR LENS PLACEMENT ([facility]);  Surgeon: Rutherford Guys, MD;  Location: [facility];  Service: Ophthalmology;  Laterality: Right;  CDE:6.12  ? CATARACT EXTRACTION W/PHACO Left 05/31/2015  ? Procedure: CATARACT EXTRACTION PHACO AND INTRAOCULAR LENS PLACEMENT ([facility]);  Surgeon: Rutherford Guys, MD;  Location: [facility];  Service: Ophthalmology;  Laterality: Left;  CDE: 6.27  ? COLONOSCOPY  2017  ? HD-suprep(good)-tics/TA x 1  ? EYE SURGERY    ? FOOT SURGERY Bilateral   ? TOTAL KNEE ARTHROPLASTY  11/05/2011  ? Procedure: TOTAL KNEE ARTHROPLASTY;  Surgeon: Gearlean Alf, MD;  [facility];  Service: Orthopedics;  Laterality: Left;  ? TOTAL KNEE ARTHROPLASTY Right 02/09/2013  ? Procedure: RIGHT TOTAL KNEE ARTHROPLASTY;  Surgeon: Gearlean Alf, MD;  [facility];  Service: Orthopedics;  Laterality: Right;  ? TOTAL SHOULDER ARTHROPLASTY Right 07/24/2018  ? Procedure: RIGHT TOTAL SHOULDER ARTHROPLASTY;  Surgeon: Justice Britain, MD;  Location: [place];  Service: Orthopedics;  Laterality: Right;  132mn  ? ? reports that she quit smoking about 30 years ago. Her smoking use included cigarettes. She has a 18.00 pack-year smoking history. She has never used smokeless tobacco. She reports that she does not drink alcohol and does not  use drugs. ?family history includes Arthritis in an other family member; Colon polyps in her mother; Dementia in her mother; Diabetes in her brother; Heart disease in her brother, father, and another family member; Hypertension in her brother, father, and sister; Stroke in her father and another family member. ?Allergies  ?Allergen Reactions  ? Cefuroxime Rash  ? Cefoxitin Hives and Rash  ? Cefuroxime Axetil Hives and Rash  ?   ceftin=hives  ? ? ?Review of Systems  ?Constitutional:  Negative for chills, fever and weight loss.  ?Respiratory:  Negative for cough, hemoptysis and shortness of breath.   ?Cardiovascular:  Negative for chest pain.  ?Genitourinary:  Negative for dysuria.   ?Neurological:  Negative for dizziness and headaches.  ? ?  ?Objective:  ?  ? ?BP (!) 142/62 (BP Location: Left Arm, Cuff Size: Large)   Pulse 90   Temp (!) 97.4 ?F (36.3 ?C) (Oral)   Ht 5' 3.5" (1.613 m)   Wt 240 lb 12.8 oz (109.2 kg)   SpO2 96%   BMI 41.99 kg/m?  ? ? ?Physical Exam ?Vitals reviewed.  ?Constitutional:   ?   Appearance: She is obese.  ?Cardiovascular:  ?   Rate and Rhythm: Normal rate and regular rhythm.  ?Pulmonary:  ?   Effort: Pulmonary effort is normal.  ?   Breath sounds: Normal breath sounds. No wheezing or rales.  ?Musculoskeletal:  ?   Right lower leg: No edema.  ?   Left lower leg: No edema.  ?Neurological:  ?   Mental Status: She is alert.  ? ? ? ?No results found for any visits on 01/03/22. ? ? ? ?The 10-year ASCVD risk score (Arnett DK, et al., 2019) is: 16.5% ? ?  ?Assessment & Plan:  ? ?#1 hypertension.  Some improved with systolic reduction 503 to 142.  Recent increase in amlodipine to 10 mg daily and she is also on valsartan HCTZ 160/25 mg.  Would like to see her blood pressure down around 130/80.  We have strongly advocated she try to lose some weight.  Reduce sodium intake.  We did discuss possible aldosterone antagonist such as low-dose Aldactone but she declines at this time.  We agreed to 48-monthfollow-up to reassess at that point ? ?#2 recurrent rash under both breasts.  Has not responded to antifungal such as Lotrimin in the past.  We refilled her triamcinolone ointment which has worked well.  Keep area dry as possible.  She knows to avoid chronic daily use ? ?#3 history of prediabetes range blood sugars.  Last A1c 5.7%.  Discussed diet in terms of reducing starches and sugars and recheck A1c with next blood draw. ? ? ?Return in about 3 months (around 04/05/2022).  ? ? ?BCarolann Littler MD ? ?

## 2022-01-03 NOTE — Patient Instructions (Signed)
Follow up BP some improved at 142/62 ? ?See if you can lose a few pounds and I think we can get BP to goal ? ?Set up 3 month follow up.  ? ?I have refilled the Triamcinolone cream.   ?

## 2022-01-17 ENCOUNTER — Telehealth: Payer: Self-pay | Admitting: Family Medicine

## 2022-01-17 NOTE — Telephone Encounter (Signed)
Pt call and stated BP medication that dr.Burchette change her to now she have swelling in both legs and want a call back. ?

## 2022-01-17 NOTE — Telephone Encounter (Signed)
I spoke with the pt and she reported swelling on both legs, x4 days after increasing BP mediacation. Pt has been scheduled for OV 01/19/2022 ?

## 2022-01-17 NOTE — Telephone Encounter (Signed)
Left message for patient to call back and schedule Medicare Annual Wellness Visit (AWV) either virtually or in office. Left  my jabber number [phone]    awvi per [place] 03/03/14  please schedule at anytime with [facility] Nurse Health Advisor 1 or 2   This should be a 45 minute visit.  

## 2022-01-18 NOTE — Telephone Encounter (Signed)
Pt informed of the message and verbalized understanding  

## 2022-01-19 ENCOUNTER — Encounter: Payer: Self-pay | Admitting: Family Medicine

## 2022-01-19 ENCOUNTER — Ambulatory Visit (INDEPENDENT_AMBULATORY_CARE_PROVIDER_SITE_OTHER): Payer: Medicare HMO | Admitting: Family Medicine

## 2022-01-19 VITALS — BP 144/60 | HR 77 | Temp 97.8°F | Ht 63.5 in | Wt 244.5 lb

## 2022-01-19 DIAGNOSIS — R600 Localized edema: Secondary | ICD-10-CM | POA: Diagnosis not present

## 2022-01-19 DIAGNOSIS — I10 Essential (primary) hypertension: Secondary | ICD-10-CM

## 2022-01-19 MED ORDER — AMLODIPINE BESYLATE 5 MG PO TABS: 5.0000 mg | ORAL_TABLET | Freq: Every day | ORAL | 3 refills | Status: DC

## 2022-01-19 MED ORDER — SPIRONOLACTONE 25 MG PO TABS: 25.0000 mg | ORAL_TABLET | Freq: Every day | ORAL | 3 refills | Status: DC

## 2022-01-19 NOTE — Progress Notes (Signed)
Established Patient Office Visit  Subjective   Patient ID: Carla Schmitt, female    DOB: 11/19/1945  Age: 76 y.o. MRN: [id number]  Chief Complaint  Patient presents with   Medication Reaction    Patient states she thinks she has reaction to BP med-Amlodipine. Swelling on lower legs. 5-6x days.     HPI   Here for follow-up hypertension.  We recently increased her amlodipine to 10 mg daily.  Unfortunately, she has had some lower extremity swelling.  We had warned her that may be possible.  Her weight is up a few pounds as well.  She does remain on valsartan HCTZ and taking regularly.  No dietary changes.  No dyspnea.  No orthopnea.  Past Medical History:  Diagnosis Date   Anemia    SOME after HER KNEE SURGERY   Arthritis    generalized   Cataract    bilateral sx   Colon polyps    Diverticulosis    History of kidney stones    Hypertension    on meds   Irritable bowel    PONV (postoperative nausea and vomiting)    WITH 2ND KNEE SURGERY, HAD SOME N/V   Stiffness of joint, shoulder region    left   Tinnitus    both ears most of the time   Varicose veins    both legs   Past Surgical History:  Procedure Laterality Date   ABDOMINAL HYSTERECTOMY  02/09/2010   TAH   BREAST BIOPSY Left 2018   Fibroadenoma   CATARACT EXTRACTION W/PHACO Right 05/17/2015   Procedure: CATARACT EXTRACTION PHACO AND INTRAOCULAR LENS PLACEMENT ([place]);  Surgeon: Rutherford Guys, MD;  Location: [facility];  Service: Ophthalmology;  Laterality: Right;  CDE:6.12   CATARACT EXTRACTION W/PHACO Left 05/31/2015   Procedure: CATARACT EXTRACTION PHACO AND INTRAOCULAR LENS PLACEMENT ([facility]);  Surgeon: Rutherford Guys, MD;  Location: [facility];  Service: Ophthalmology;  Laterality: Left;  CDE: 6.27   COLONOSCOPY  2017   HD-suprep(good)-tics/TA x 1   EYE SURGERY     FOOT SURGERY Bilateral    TOTAL KNEE ARTHROPLASTY  11/05/2011   Procedure: TOTAL KNEE ARTHROPLASTY;  Surgeon: Gearlean Alf, MD;  [facility];  Service:  Orthopedics;  Laterality: Left;   TOTAL KNEE ARTHROPLASTY Right 02/09/2013   Procedure: RIGHT TOTAL KNEE ARTHROPLASTY;  Surgeon: Gearlean Alf, MD;  [facility];  Service: Orthopedics;  Laterality: Right;   TOTAL SHOULDER ARTHROPLASTY Right 07/24/2018   Procedure: RIGHT TOTAL SHOULDER ARTHROPLASTY;  Surgeon: Justice Britain, MD;  Location: [place];  Service: Orthopedics;  Laterality: Right;  142mn    reports that she quit smoking about 30 years ago. Her smoking use included cigarettes. She has a 18.00 pack-year smoking history. She has never used smokeless tobacco. She reports that she does not drink alcohol and does not use drugs. family history includes Arthritis in an other family member; Colon polyps in her mother; Dementia in her mother; Diabetes in her brother; Heart disease in her brother, father, and another family member; Hypertension in her brother, father, and sister; Stroke in her father and another family member. Allergies  Allergen Reactions   Cefuroxime Rash   Cefoxitin Hives and Rash   Cefuroxime Axetil Hives and Rash     ceftin=hives    Review of Systems  Constitutional:  Negative for weight loss.  Respiratory:  Negative for shortness of breath.   Cardiovascular:  Positive for leg swelling. Negative for chest pain, palpitations and orthopnea.  Objective:     BP (!) 144/60 (BP Location: Left Arm, Patient Position: Sitting, Cuff Size: Large)   Pulse 77   Temp 97.8 F (36.6 C) (Oral)   Ht 5' 3.5" (1.613 m)   Wt 244 lb 8 oz (110.9 kg)   SpO2 97%   BMI 42.63 kg/m    Physical Exam Vitals reviewed.  Cardiovascular:     Rate and Rhythm: Normal rate and regular rhythm.  Pulmonary:     Effort: Pulmonary effort is normal.     Breath sounds: Normal breath sounds.  Musculoskeletal:     Comments: Trace pitting edema lower legs bilaterally  Neurological:     Mental Status: She is alert.     No results found for any visits on 01/19/22.    The 10-year  ASCVD risk score (Arnett DK, et al., 2019) is: 16.9%    Assessment & Plan:   Patient presents with bilateral lower extremity edema after increasing amlodipine to 10 mg.  Likely related to the increased amlodipine.  -Reduce amlodipine back to 5 mg daily -Continue valsartan HCTZ 160/25 mg 1 daily -Start Aldactone 25 mg daily.  She is cautioned about potential electrolyte disturbance such as hyperkalemia.  She does not take any potassium supplements. -Recheck in 2 weeks and we will plan to recheck basic metabolic panel then.  Return in about 2 weeks (around 02/02/2022).    Carolann Littler, MD

## 2022-01-19 NOTE — Patient Instructions (Addendum)
Reduce the Amlodipine back to 5 mg daily (new prescription sent)  Start the Aldactone 25 mg once daily.    Continue the Valsartan HCTZ at current dose.  87?"

## 2022-01-23 ENCOUNTER — Ambulatory Visit: Payer: Medicare HMO | Admitting: Family Medicine

## 2022-02-02 ENCOUNTER — Ambulatory Visit (INDEPENDENT_AMBULATORY_CARE_PROVIDER_SITE_OTHER): Payer: Medicare HMO | Admitting: Family Medicine

## 2022-02-02 ENCOUNTER — Encounter: Payer: Self-pay | Admitting: Family Medicine

## 2022-02-02 VITALS — BP 138/62 | HR 86 | Temp 98.0°F | Ht 63.5 in | Wt 232.9 lb

## 2022-02-02 DIAGNOSIS — I10 Essential (primary) hypertension: Secondary | ICD-10-CM

## 2022-02-02 LAB — BASIC METABOLIC PANEL
BUN: 30 mg/dL — ABNORMAL HIGH (ref 6–23)
CO2: 28 mEq/L (ref 19–32)
Calcium: 11.1 mg/dL — ABNORMAL HIGH (ref 8.4–10.5)
Chloride: 101 mEq/L (ref 96–112)
Creatinine, Ser: 1.02 mg/dL (ref 0.40–1.20)
GFR: 54.43 mL/min — ABNORMAL LOW (ref >60.00)
Glucose, Bld: 88 mg/dL (ref 70–99)
Potassium: 4.3 mEq/L (ref 3.5–5.1)
Sodium: 138 mEq/L (ref 135–145)

## 2022-02-02 NOTE — Progress Notes (Signed)
Established Patient Office Visit  Subjective   Patient ID: Carla Schmitt, female    DOB: 11-06-1945  Age: 76 y.o. MRN: [id number]  Chief Complaint  Patient presents with   Follow-up    HPI   Here for follow-up regarding hypertension.  She had poorly controlled blood pressure and we had recently increased her amlodipine to 10 mg daily knowing that she may have some edema.  She did have increased peripheral edema and last visit we dropped this back to 5 mg and added Aldactone in addition to her Diovan HCTZ.  Her edema has improved.  Home blood pressures have been fairly well controlled.  She had recent readings of 124/71, 126/50, and 122/51.  Tolerating well with no side effects.  No dizziness.  She does not take any potassium supplementation.  We had encouraged her to lose some weight and she has lost almost 11 pounds since last visit.  We do realize some of this may be fluid weight with her edema improving off amlodipine  Past Medical History:  Diagnosis Date   Anemia    SOME after HER KNEE SURGERY   Arthritis    generalized   Cataract    bilateral sx   Colon polyps    Diverticulosis    History of kidney stones    Hypertension    on meds   Irritable bowel    PONV (postoperative nausea and vomiting)    WITH 2ND KNEE SURGERY, HAD SOME N/V   Stiffness of joint, shoulder region    left   Tinnitus    both ears most of the time   Varicose veins    both legs   Past Surgical History:  Procedure Laterality Date   ABDOMINAL HYSTERECTOMY  02/09/2010   TAH   BREAST BIOPSY Left 2018   Fibroadenoma   CATARACT EXTRACTION W/PHACO Right 05/17/2015   Procedure: CATARACT EXTRACTION PHACO AND INTRAOCULAR LENS PLACEMENT ([place]);  Surgeon: Rutherford Guys, MD;  Location: [facility];  Service: Ophthalmology;  Laterality: Right;  CDE:6.12   CATARACT EXTRACTION W/PHACO Left 05/31/2015   Procedure: CATARACT EXTRACTION PHACO AND INTRAOCULAR LENS PLACEMENT ([facility]);  Surgeon: Rutherford Guys, MD;  Location:  [facility];  Service: Ophthalmology;  Laterality: Left;  CDE: 6.27   COLONOSCOPY  2017   HD-suprep(good)-tics/TA x 1   EYE SURGERY     FOOT SURGERY Bilateral    TOTAL KNEE ARTHROPLASTY  11/05/2011   Procedure: TOTAL KNEE ARTHROPLASTY;  Surgeon: Gearlean Alf, MD;  Location: [facility];  Service: Orthopedics;  Laterality: Left;   TOTAL KNEE ARTHROPLASTY Right 02/09/2013   Procedure: RIGHT TOTAL KNEE ARTHROPLASTY;  Surgeon: Gearlean Alf, MD;  Location: [facility];  Service: Orthopedics;  Laterality: Right;   TOTAL SHOULDER ARTHROPLASTY Right 07/24/2018   Procedure: RIGHT TOTAL SHOULDER ARTHROPLASTY;  Surgeon: Justice Britain, MD;  Location: [place];  Service: Orthopedics;  Laterality: Right;  119mn    reports that she quit smoking about 30 years ago. Her smoking use included cigarettes. She has a 18.00 pack-year smoking history. She has never used smokeless tobacco. She reports that she does not drink alcohol and does not use drugs. family history includes Arthritis in an other family member; Colon polyps in her mother; Dementia in her mother; Diabetes in her brother; Heart disease in her brother, father, and another family member; Hypertension in her brother, father, and sister; Stroke in her father and another family member. Allergies  Allergen Reactions   Cefuroxime Rash   Cefoxitin Hives  and Rash   Cefuroxime Axetil Hives and Rash     ceftin=hives    Review of Systems  Constitutional:  Negative for chills and fever.  Respiratory:  Negative for shortness of breath.   Cardiovascular:  Negative for chest pain.     Objective:     BP 138/62 (BP Location: Left Arm, Cuff Size: Large)   Pulse 86   Temp 98 F (36.7 C) (Oral)   Ht 5' 3.5" (1.613 m)   Wt 232 lb 14.4 oz (105.6 kg)   SpO2 98%   BMI 40.61 kg/m  BP Readings from Last 3 Encounters:  02/02/22 138/62  01/19/22 (!) 144/60  01/03/22 (!) 142/62   Wt Readings from Last 3 Encounters:  02/02/22 232 lb 14.4 oz (105.6 kg)  01/19/22 244  lb 8 oz (110.9 kg)  01/03/22 240 lb 12.8 oz (109.2 kg)      Physical Exam Vitals reviewed.  Cardiovascular:     Rate and Rhythm: Normal rate and regular rhythm.  Pulmonary:     Effort: Pulmonary effort is normal.     Breath sounds: Normal breath sounds.  Musculoskeletal:     Right lower leg: No edema.     Left lower leg: No edema.  Neurological:     Mental Status: She is alert.     No results found for any visits on 02/02/22.  Last metabolic panel Lab Results  Component Value Date   GLUCOSE 92 08/17/2021   NA 140 08/17/2021   K 3.5 08/17/2021   CL 99 08/17/2021   CO2 32 08/17/2021   BUN 27 (H) 08/17/2021   CREATININE 0.92 08/17/2021   GFRNONAA 59 (L) 07/17/2018   CALCIUM 10.7 (H) 08/17/2021   PROT 7.8 08/01/2021   ALBUMIN 4.5 08/01/2021   BILITOT 0.4 08/01/2021   ALKPHOS 59 08/01/2021   AST 15 08/01/2021   ALT 11 08/01/2021   ANIONGAP 9 07/17/2018      The 10-year ASCVD risk score (Arnett DK, et al., 2019) is: 15.8%    Assessment & Plan:   Problem List Items Addressed This Visit       Unprioritized   Hypertension - Primary (Chronic)   Relevant Orders   Basic metabolic panel  Initial reading up today but improved after rest with reading 138/62 left arm seated large cuff.  -Check basic metabolic panel with recent initiation of Aldactone. -Try to continue weight loss efforts -Continue close home monitoring of blood pressure and set up 57-monthfollow-up  Return in about 3 months (around 05/05/2022).    BCarolann Littler MD

## 2022-02-02 NOTE — Patient Instructions (Signed)
Keep up the weight loss!      Blood pressure is improving.

## 2022-02-13 ENCOUNTER — Telehealth: Payer: Self-pay | Admitting: Family Medicine

## 2022-02-13 NOTE — Telephone Encounter (Signed)
Left message for patient to call back and schedule Medicare Annual Wellness Visit (AWV) either virtually or in office. Left  my jabber number [phone]    awvi per [place] 03/03/14  please schedule at anytime with [facility] Nurse Health Advisor 1 or 2   This should be a 45 minute visit.  

## 2022-03-08 ENCOUNTER — Other Ambulatory Visit: Payer: Self-pay | Admitting: Family Medicine

## 2022-04-06 ENCOUNTER — Telehealth: Payer: Self-pay | Admitting: Family Medicine

## 2022-04-06 NOTE — Telephone Encounter (Signed)
Left message for patient to call back and schedule Medicare Annual Wellness Visit (AWV) either virtually or in office. Left  my Herbie Drape number [phone]    awvi per [facility] 03/03/14  please schedule at anytime with [facility] Nurse Health Advisor 1 or 2   This should be a 45 minute visit.

## 2022-05-01 ENCOUNTER — Telehealth: Payer: Self-pay | Admitting: Family Medicine

## 2022-05-01 NOTE — Telephone Encounter (Signed)
Left message for patient to call back and schedule Medicare Annual Wellness Visit (AWV) either virtually or in office. Left  my Carla Schmitt number [phone]    awvi per [facility] 03/03/14  please schedule at anytime with [facility] Nurse Health Advisor 1 or 2   This should be a 45 minute visit.

## 2022-05-11 ENCOUNTER — Ambulatory Visit (INDEPENDENT_AMBULATORY_CARE_PROVIDER_SITE_OTHER): Payer: Medicare HMO | Admitting: Family Medicine

## 2022-05-11 ENCOUNTER — Encounter: Payer: Self-pay | Admitting: Family Medicine

## 2022-05-11 DIAGNOSIS — I10 Essential (primary) hypertension: Secondary | ICD-10-CM

## 2022-05-11 DIAGNOSIS — M8580 Other specified disorders of bone density and structure, unspecified site: Secondary | ICD-10-CM

## 2022-05-11 LAB — COMPREHENSIVE METABOLIC PANEL
ALT: 10 U/L (ref 0–35)
AST: 16 U/L (ref 0–37)
Albumin: 4.2 g/dL (ref 3.5–5.2)
Alkaline Phosphatase: 73 U/L (ref 39–117)
BUN: 27 mg/dL — ABNORMAL HIGH (ref 6–23)
CO2: 27 mEq/L (ref 19–32)
Calcium: 10.2 mg/dL (ref 8.4–10.5)
Chloride: 104 mEq/L (ref 96–112)
Creatinine, Ser: 1.12 mg/dL (ref 0.40–1.20)
GFR: 48.56 mL/min — ABNORMAL LOW (ref >60.00)
Glucose, Bld: 98 mg/dL (ref 70–99)
Potassium: 4.1 mEq/L (ref 3.5–5.1)
Sodium: 139 mEq/L (ref 135–145)
Total Bilirubin: 0.4 mg/dL (ref 0.2–1.2)
Total Protein: 7.7 g/dL (ref 6.0–8.3)

## 2022-05-11 MED ORDER — SPIRONOLACTONE 25 MG PO TABS
25.0000 mg | ORAL_TABLET | Freq: Every day | ORAL | 3 refills | Status: DC
Start: 2022-05-11 — End: 2023-05-09

## 2022-05-11 NOTE — Progress Notes (Unsigned)
Established Patient Office Visit  Subjective   Patient ID: Carla Schmitt, female    DOB: 1946-05-03  Age: 76 y.o. MRN: [id number]  Chief Complaint  Patient presents with   Follow-up    HPI  {History (Optional):23778} Here for medical follow-up.  She has history of hypertension.  Blood pressure was poorly controlled and we added Aldactone 25 mg daily.  Blood pressures been some improved since then.  No dizziness or lightheadedness.  She does have history of mild hypercalcemia.  Last calcium was 11.1 but not corrected as we had no albumin.  We recommended follow-up now for repeat calcium and albumin.  We suspect she has mild hypercalcemia related to thiazide.  She has been taking calcium supplement and recently scaled that back  She has history of osteopenia but has not had DEXA scan since 2016.  No recent falls.  No recent fractures.  Past Medical History:  Diagnosis Date   Anemia    SOME after HER KNEE SURGERY   Arthritis    generalized   Cataract    bilateral sx   Colon polyps    Diverticulosis    History of kidney stones    Hypertension    on meds   Irritable bowel    PONV (postoperative nausea and vomiting)    WITH 2ND KNEE SURGERY, HAD SOME N/V   Stiffness of joint, shoulder region    left   Tinnitus    both ears most of the time   Varicose veins    both legs   Past Surgical History:  Procedure Laterality Date   ABDOMINAL HYSTERECTOMY  02/09/2010   TAH   BREAST BIOPSY Left 2018   Fibroadenoma   CATARACT EXTRACTION W/PHACO Right 05/17/2015   Procedure: CATARACT EXTRACTION PHACO AND INTRAOCULAR LENS PLACEMENT ([place]);  Surgeon: Rutherford Guys, MD;  Location: [facility];  Service: Ophthalmology;  Laterality: Right;  CDE:6.12   CATARACT EXTRACTION W/PHACO Left 05/31/2015   Procedure: CATARACT EXTRACTION PHACO AND INTRAOCULAR LENS PLACEMENT ([facility]);  Surgeon: Rutherford Guys, MD;  Location: [facility];  Service: Ophthalmology;  Laterality: Left;  CDE: 6.27   COLONOSCOPY  2017    HD-suprep(good)-tics/TA x 1   EYE SURGERY     FOOT SURGERY Bilateral    TOTAL KNEE ARTHROPLASTY  11/05/2011   Procedure: TOTAL KNEE ARTHROPLASTY;  Surgeon: Gearlean Alf, MD;  [facility];  Service: Orthopedics;  Laterality: Left;   TOTAL KNEE ARTHROPLASTY Right 02/09/2013   Procedure: RIGHT TOTAL KNEE ARTHROPLASTY;  Surgeon: Gearlean Alf, MD;  Location[facility];  Service: Orthopedics;  Laterality: Right;   TOTAL SHOULDER ARTHROPLASTY Right 07/24/2018   Procedure: RIGHT TOTAL SHOULDER ARTHROPLASTY;  Surgeon: Justice Britain, MD;  Location: [place];  Service: Orthopedics;  Laterality: Right;  141mn    reports that she quit smoking about 30 years ago. Her smoking use included cigarettes. She has a 18.00 pack-year smoking history. She has never used smokeless tobacco. She reports that she does not drink alcohol and does not use drugs. family history includes Arthritis in an other family member; Colon polyps in her mother; Dementia in her mother; Diabetes in her brother; Heart disease in her brother, father, and another family member; Hypertension in her brother, father, and sister; Stroke in her father and another family member. Allergies  Allergen Reactions   Cefuroxime Rash   Cefoxitin Hives and Rash   Cefuroxime Axetil Hives and Rash     ceftin=hives    Review of Systems  Constitutional:  Negative for malaise/fatigue.  Eyes:  Negative for blurred vision.  Respiratory:  Negative for shortness of breath.   Cardiovascular:  Negative for chest pain.  Neurological:  Negative for dizziness, weakness and headaches.      Objective:     BP (!) 144/70 (BP Location: Left Arm, Patient Position: Sitting, Cuff Size: Normal)   Pulse 80   Temp 98 F (36.7 C) (Oral)   Ht 5' 3.5" (1.613 m)   Wt 239 lb 1.6 oz (108.5 kg)   SpO2 99%   BMI 41.69 kg/m  {Vitals History (Optional):23777}  Physical Exam Vitals reviewed.  Constitutional:      Appearance: She is well-developed.  Eyes:      Pupils: Pupils are equal, round, and reactive to light.  Neck:     Thyroid: No thyromegaly.     Vascular: No JVD.  Cardiovascular:     Rate and Rhythm: Normal rate and regular rhythm.     Heart sounds:     No gallop.  Pulmonary:     Effort: Pulmonary effort is normal. No respiratory distress.     Breath sounds: Normal breath sounds. No wheezing or rales.  Musculoskeletal:     Cervical back: Neck supple.     Right lower leg: No edema.     Left lower leg: No edema.  Neurological:     Mental Status: She is alert.      No results found for any visits on 05/11/22.  {Labs (Optional):23779}  The 10-year ASCVD risk score (Arnett DK, et al., 2019) is: 17.8%    Assessment & Plan:   #1 hypertension stable and slightly improved since addition of Aldactone.  Refilled Aldactone No. 90 with 3 refills  #2 mild hypercalcemia.  Suspect related to thiazides.  Recheck comprehensive metabolic panel to assess corrected calcium.  If climbing consider further evaluation with parathyroid hormone and other lab evaluation including thyroid functions, sed rate, etc. -Hold over-the-counter calcium supplement for now  #3 osteopenia by DEXA scan 7 years ago.  Recommend reassess DEXA scan   Return in about 3 months (around 08/10/2022).    Carolann Littler, MD

## 2022-05-11 NOTE — Patient Instructions (Signed)
HOLD the OTC calcium supplement for now.

## 2022-05-16 DIAGNOSIS — H52201 Unspecified astigmatism, right eye: Secondary | ICD-10-CM | POA: Diagnosis not present

## 2022-05-16 DIAGNOSIS — H5211 Myopia, right eye: Secondary | ICD-10-CM | POA: Diagnosis not present

## 2022-05-16 DIAGNOSIS — H4321 Crystalline deposits in vitreous body, right eye: Secondary | ICD-10-CM | POA: Diagnosis not present

## 2022-05-16 DIAGNOSIS — H524 Presbyopia: Secondary | ICD-10-CM | POA: Diagnosis not present

## 2022-05-16 DIAGNOSIS — Z961 Presence of intraocular lens: Secondary | ICD-10-CM | POA: Diagnosis not present

## 2022-05-28 ENCOUNTER — Telehealth: Payer: Self-pay | Admitting: Family Medicine

## 2022-05-28 NOTE — Telephone Encounter (Signed)
Left message for patient to call back and schedule Medicare Annual Wellness Visit (AWV) either virtually or in office. Left  my Herbie Drape number [phone]   awvi [facility] 03/03/14  please schedule at anytime with [facility] Nurse Health Advisor 1 or 2   This should be a 45 minute visit.

## 2022-06-17 ENCOUNTER — Other Ambulatory Visit: Payer: Self-pay | Admitting: Family Medicine

## 2022-06-22 ENCOUNTER — Ambulatory Visit (INDEPENDENT_AMBULATORY_CARE_PROVIDER_SITE_OTHER)
Admission: RE | Admit: 2022-06-22 | Discharge: 2022-06-22 | Disposition: A | Discharge status: Home / Self Care | Payer: Medicare HMO | Source: Ambulatory Visit | Attending: Family Medicine | Admitting: Family Medicine

## 2022-06-22 DIAGNOSIS — M8580 Other specified disorders of bone density and structure, unspecified site: Secondary | ICD-10-CM

## 2022-06-25 ENCOUNTER — Telehealth: Payer: Self-pay | Admitting: Family Medicine

## 2022-06-25 NOTE — Telephone Encounter (Signed)
Please see result note 

## 2022-06-25 NOTE — Telephone Encounter (Signed)
Pt is return Carla Schmitt concerning bone density test

## 2022-07-04 ENCOUNTER — Other Ambulatory Visit: Payer: Self-pay | Admitting: Family Medicine

## 2022-07-04 ENCOUNTER — Telehealth: Payer: Self-pay | Admitting: Family Medicine

## 2022-07-04 NOTE — Telephone Encounter (Signed)
I spoke with the patient and she stated she has been experiencing breast tenderness. Patient stated she would call back to schedule an OV at her convenience.

## 2022-07-04 NOTE — Telephone Encounter (Signed)
Requesting an order for a diagnostic mammogram. Called the [facility] and they  requested her PCP enter an order

## 2022-07-10 ENCOUNTER — Telehealth: Payer: Self-pay | Admitting: Family Medicine

## 2022-07-10 NOTE — Telephone Encounter (Signed)
Spoke with patient to schedule Medicare Annual Wellness Visit (AWV) either virtually or in office.   Patient stated she wanted to talk to Provider before schedule.     Due awvi [facility] 03/03/14 please schedule with Nurse Health Adviser   45 min for awv-i and in office appointments 30 min for awv-s  phone/virtual appointments

## 2022-08-03 ENCOUNTER — Encounter: Payer: Medicare HMO | Admitting: Family Medicine

## 2022-08-29 ENCOUNTER — Ambulatory Visit (INDEPENDENT_AMBULATORY_CARE_PROVIDER_SITE_OTHER): Payer: Medicare HMO | Admitting: Family Medicine

## 2022-08-29 ENCOUNTER — Encounter: Payer: Self-pay | Admitting: Family Medicine

## 2022-08-29 VITALS — BP 136/60 | HR 85 | Temp 97.4°F | Ht 63.39 in | Wt 241.7 lb

## 2022-08-29 DIAGNOSIS — Z0000 Encounter for general adult medical examination without abnormal findings: Secondary | ICD-10-CM

## 2022-08-29 DIAGNOSIS — R7303 Prediabetes: Secondary | ICD-10-CM

## 2022-08-29 DIAGNOSIS — I10 Essential (primary) hypertension: Secondary | ICD-10-CM

## 2022-08-29 LAB — COMPREHENSIVE METABOLIC PANEL
ALT: 12 U/L (ref 0–35)
AST: 19 U/L (ref 0–37)
Albumin: 4.4 g/dL (ref 3.5–5.2)
Alkaline Phosphatase: 65 U/L (ref 39–117)
BUN: 37 mg/dL — ABNORMAL HIGH (ref 6–23)
CO2: 28 mEq/L (ref 19–32)
Calcium: 10.3 mg/dL (ref 8.4–10.5)
Chloride: 106 mEq/L (ref 96–112)
Creatinine, Ser: 1.08 mg/dL (ref 0.40–1.20)
GFR: 50.62 mL/min — ABNORMAL LOW (ref >60.00)
Glucose, Bld: 94 mg/dL (ref 70–99)
Potassium: 4 mEq/L (ref 3.5–5.1)
Sodium: 141 mEq/L (ref 135–145)
Total Bilirubin: 0.4 mg/dL (ref 0.2–1.2)
Total Protein: 7.7 g/dL (ref 6.0–8.3)

## 2022-08-29 LAB — HEMOGLOBIN A1C: Hgb A1c MFr Bld: 6 % (ref 4.6–6.5)

## 2022-08-29 MED ORDER — VALSARTAN-HYDROCHLOROTHIAZIDE 160-25 MG PO TABS
1.0000 | ORAL_TABLET | Freq: Every day | ORAL | 3 refills | Status: DC
Start: 2022-08-29 — End: 2023-09-11

## 2022-08-29 NOTE — Patient Instructions (Signed)
Consider RSV and Shingrix vaccines at some point this year.

## 2022-08-29 NOTE — Progress Notes (Signed)
Established Patient Office Visit  Subjective   Patient ID: CHRISANNA MISHRA, female    DOB: March 26, 1946  Age: 76 y.o. MRN: [id number]  Chief Complaint  Patient presents with   Annual Exam    HPI   Ms. Rothermel is seen for physical exam.  She has history of hypertension, osteoarthritis, and prediabetes.  Generally doing well.  Denies any specific complaints today.  She had recent DEXA scan which showed only mild osteopenia in her hips.  She remains on Diovan HCTZ and Aldactone as well as amlodipine.  Needs refills of her Diovan today.  Health maintenance reviewed  -Flu vaccine already given -Pneumonia vaccines complete -Has not had RSV or Shingrix -DEXA scan as above -Mammogram due 7/24 -Colonoscopy due 11/25  Social history-she is married.  This is second marriage and she has been married 60 years.  She has 2 children.  She has a son who lives in [place] and daughter who lives here.  3 grandchildren.  Quit smoking 1992.  No alcohol use.   Family history-multiple family members with hypertension.  Brother with type 2 diabetes.  Brother and father had CAD in their late 66s.  Mother had Alzheimer's dementia-late onset  Past Medical History:  Diagnosis Date   Anemia    SOME after HER KNEE SURGERY   Arthritis    generalized   Cataract    bilateral sx   Colon polyps    Diverticulosis    History of kidney stones    Hypertension    on meds   Irritable bowel    PONV (postoperative nausea and vomiting)    WITH 2ND KNEE SURGERY, HAD SOME N/V   Stiffness of joint, shoulder region    left   Tinnitus    both ears most of the time   Varicose veins    both legs   Past Surgical History:  Procedure Laterality Date   ABDOMINAL HYSTERECTOMY  02/09/2010   TAH   BREAST BIOPSY Left 2018   Fibroadenoma   CATARACT EXTRACTION W/PHACO Right 05/17/2015   Procedure: CATARACT EXTRACTION PHACO AND INTRAOCULAR LENS PLACEMENT ([place]);  Surgeon: Rutherford Guys, MD;  Location: [facility];  Service:  Ophthalmology;  Laterality: Right;  CDE:6.12   CATARACT EXTRACTION W/PHACO Left 05/31/2015   Procedure: CATARACT EXTRACTION PHACO AND INTRAOCULAR LENS PLACEMENT ([facility]);  Surgeon: Rutherford Guys, MD;  Location: [facility];  Service: Ophthalmology;  Laterality: Left;  CDE: 6.27   COLONOSCOPY  2017   HD-suprep(good)-tics/TA x 1   EYE SURGERY     FOOT SURGERY Bilateral    TOTAL KNEE ARTHROPLASTY  11/05/2011   Procedure: TOTAL KNEE ARTHROPLASTY;  Surgeon: Gearlean Alf, MD;  [facility];  Service: Orthopedics;  Laterality: Left;   TOTAL KNEE ARTHROPLASTY Right 02/09/2013   Procedure: RIGHT TOTAL KNEE ARTHROPLASTY;  Surgeon: Gearlean Alf, MD;  Location[facility];  Service: Orthopedics;  Laterality: Right;   TOTAL SHOULDER ARTHROPLASTY Right 07/24/2018   Procedure: RIGHT TOTAL SHOULDER ARTHROPLASTY;  Surgeon: Justice Britain, MD;  Location: [place];  Service: Orthopedics;  Laterality: Right;  145mn    reports that she quit smoking about 31 years ago. Her smoking use included cigarettes. She has a 18.00 pack-year smoking history. She has never used smokeless tobacco. She reports that she does not drink alcohol and does not use drugs. family history includes Arthritis in an other family member; Colon polyps in her mother; Dementia in her mother; Diabetes in her brother; Heart disease in her brother, father,  and another family member; Hypertension in her brother, father, and sister; Stroke in her father and another family member. Allergies  Allergen Reactions   Cefuroxime Rash   Cefoxitin Hives and Rash   Cefuroxime Axetil Hives and Rash     ceftin=hives      Review of Systems  Constitutional:  Negative for malaise/fatigue.  Eyes:  Negative for blurred vision.  Respiratory:  Negative for shortness of breath.   Cardiovascular:  Negative for chest pain.  Gastrointestinal:  Negative for abdominal pain.  Genitourinary:  Negative for dysuria.  Neurological:  Negative for dizziness, weakness and  headaches.  Psychiatric/Behavioral:  Negative for depression.       Objective:     BP 136/60 (BP Location: Left Arm, Patient Position: Sitting, Cuff Size: Large)   Pulse 85   Temp (!) 97.4 F (36.3 C) (Oral)   Ht 5' 3.39" (1.61 m)   Wt 241 lb 11.2 oz (109.6 kg)   SpO2 99%   BMI 42.30 kg/m  BP Readings from Last 3 Encounters:  08/29/22 136/60  05/11/22 (!) 144/70  02/02/22 138/62   Wt Readings from Last 3 Encounters:  08/29/22 241 lb 11.2 oz (109.6 kg)  05/11/22 239 lb 1.6 oz (108.5 kg)  02/02/22 232 lb 14.4 oz (105.6 kg)      Physical Exam Vitals reviewed.  Constitutional:      Appearance: She is well-developed.  Eyes:     Pupils: Pupils are equal, round, and reactive to light.  Neck:     Thyroid: No thyromegaly.     Vascular: No JVD.  Cardiovascular:     Rate and Rhythm: Normal rate and regular rhythm.     Heart sounds:     No gallop.  Pulmonary:     Effort: Pulmonary effort is normal. No respiratory distress.     Breath sounds: Normal breath sounds. No wheezing or rales.  Musculoskeletal:     Cervical back: Neck supple.     Comments: Only trace sock line edema.  Neurological:     General: No focal deficit present.     Mental Status: She is alert.      No results found for any visits on 08/29/22.    The 10-year ASCVD risk score (Arnett DK, et al., 2019) is: 16.3%    Assessment & Plan:   #1 physical exam.  We discussed several health maintenance issues.  Consider RSV and Shingrix vaccines at some point this year.  Consider repeat mammogram by the summer.  Flu vaccine already given and Pneumovax up-to-date.  Colonoscopy due 11/25  #2 hypertension stable on multidrug regimen.  Recheck CMP.  Refill Diovan HCTZ for 1 year.  #3 history of prediabetes range blood sugars.  Recheck A1c.  Work on weight loss.  Continue low glycemic diet.   No follow-ups on file.    Carolann Littler, MD

## 2022-12-10 ENCOUNTER — Other Ambulatory Visit: Payer: Self-pay | Admitting: Family Medicine

## 2023-03-28 ENCOUNTER — Telehealth: Payer: Self-pay

## 2023-03-28 NOTE — Telephone Encounter (Signed)
LVM for patient to call back [phone], or to call PCP office to schedule follow up apt. AS, CMA  

## 2023-04-18 ENCOUNTER — Telehealth: Payer: Self-pay

## 2023-04-18 NOTE — Telephone Encounter (Signed)
LVM for patient to call back [phone], or to call PCP office to schedule follow up apt. AS, CMA  

## 2023-05-08 ENCOUNTER — Other Ambulatory Visit: Payer: Self-pay | Admitting: Family Medicine

## 2023-08-04 ENCOUNTER — Other Ambulatory Visit: Payer: Self-pay | Admitting: Family Medicine

## 2023-09-02 ENCOUNTER — Encounter: Payer: Medicare HMO | Admitting: Family Medicine

## 2023-09-06 ENCOUNTER — Other Ambulatory Visit: Payer: Self-pay | Admitting: Family Medicine

## 2023-09-10 ENCOUNTER — Other Ambulatory Visit: Payer: Self-pay | Admitting: Family Medicine

## 2023-09-11 ENCOUNTER — Ambulatory Visit (INDEPENDENT_AMBULATORY_CARE_PROVIDER_SITE_OTHER): Payer: Medicare HMO | Admitting: Family Medicine

## 2023-09-11 VITALS — BP 152/70 | HR 103 | Temp 98.1°F | Ht 63.39 in | Wt 244.4 lb

## 2023-09-11 DIAGNOSIS — I10 Essential (primary) hypertension: Secondary | ICD-10-CM

## 2023-09-11 DIAGNOSIS — Z0000 Encounter for general adult medical examination without abnormal findings: Secondary | ICD-10-CM | POA: Diagnosis not present

## 2023-09-11 DIAGNOSIS — R7303 Prediabetes: Secondary | ICD-10-CM

## 2023-09-11 LAB — COMPREHENSIVE METABOLIC PANEL
ALT: 10 U/L (ref 0–35)
AST: 19 U/L (ref 0–37)
Albumin: 4.5 g/dL (ref 3.5–5.2)
Alkaline Phosphatase: 73 U/L (ref 39–117)
BUN: 35 mg/dL — ABNORMAL HIGH (ref 6–23)
CO2: 28 meq/L (ref 19–32)
Calcium: 10.2 mg/dL (ref 8.4–10.5)
Chloride: 102 meq/L (ref 96–112)
Creatinine, Ser: 1.11 mg/dL (ref 0.40–1.20)
GFR: 48.63 mL/min — ABNORMAL LOW (ref >60.00)
Glucose, Bld: 95 mg/dL (ref 70–99)
Potassium: 3.8 meq/L (ref 3.5–5.1)
Sodium: 138 meq/L (ref 135–145)
Total Bilirubin: 0.4 mg/dL (ref 0.2–1.2)
Total Protein: 7.8 g/dL (ref 6.0–8.3)

## 2023-09-11 LAB — LIPID PANEL
Cholesterol: 164 mg/dL (ref 0–200)
HDL: 69.7 mg/dL (ref >39.00)
LDL Cholesterol: 79 mg/dL (ref 0–99)
NonHDL: 94.19
Total CHOL/HDL Ratio: 2
Triglycerides: 74 mg/dL (ref 0.0–149.0)
VLDL: 14.8 mg/dL (ref 0.0–40.0)

## 2023-09-11 LAB — HEMOGLOBIN A1C: Hgb A1c MFr Bld: 6.1 % (ref 4.6–6.5)

## 2023-09-11 MED ORDER — SPIRONOLACTONE 25 MG PO TABS: 25.0000 mg | ORAL_TABLET | Freq: Every day | ORAL | 3 refills | Status: AC

## 2023-09-11 MED ORDER — AMLODIPINE BESYLATE 5 MG PO TABS: 5.0000 mg | ORAL_TABLET | Freq: Every day | ORAL | 3 refills | Status: DC

## 2023-09-11 MED ORDER — VALSARTAN-HYDROCHLOROTHIAZIDE 160-25 MG PO TABS: 1.0000 | ORAL_TABLET | Freq: Every day | ORAL | 3 refills | Status: AC

## 2023-09-11 NOTE — Patient Instructions (Signed)
 Consider repeat mammogram this year.    Monitor BP and be in touch if consisently > 140 top number.

## 2023-09-11 NOTE — Progress Notes (Signed)
 Established Patient Office Visit  Subjective   Patient ID: Carla Schmitt, female    DOB: 1945/11/25  Age: 78 y.o. MRN: [id number]  No chief complaint on file.   HPI   Carla Schmitt is seen for physical exam.  Generally doing well.  Chronic problems include history of hypertension, obesity, prediabetes, mild hyperlipidemia.  She has gained a few pounds since last visit.  Last A1c 6.0%.  She has been frustrated with inability to lose weight.  Has not tried any specific plans recently.  She specifically inquires today about GLP-1 medications.  Blood pressure up a bit today but she states she did not take her medications this morning at her usual time and has not taken them since yesterday morning.  Health maintenance reviewed  -Flu vaccine already given -She has had prior Zostavax and also had Shingrix vaccine this past fall -Pneumonia vaccines complete -Last mammogram couple years ago.  She is undecided whether she will go back again. -Colonoscopy due 11/25  Social history-she is married.  This is second marriage and she has been married 40 years.  She has 2 children.  She has a son who lives in [place]  and daughter who lives here.  3 grandchildren.  Quit smoking 1992.  No alcohol  use.   Family history-multiple family members with hypertension.  Brother with type 2 diabetes.  Brother and father had CAD in their late 56s.  Mother had Alzheimer's dementia-late onset  Past Medical History:  Diagnosis Date   Anemia    SOME after HER KNEE SURGERY   Arthritis    generalized   Cataract    bilateral sx   Colon polyps    Diverticulosis    History of kidney stones    Hypertension    on meds   Irritable bowel    PONV (postoperative nausea and vomiting)    WITH 2ND KNEE SURGERY, HAD SOME N/V   Stiffness of joint, shoulder region    left   Tinnitus    both ears most of the time   Varicose veins    both legs   Past Surgical History:  Procedure Laterality Date   ABDOMINAL  HYSTERECTOMY  02/09/2010   TAH   BREAST BIOPSY Left 2018   Fibroadenoma   CATARACT EXTRACTION W/PHACO Right 05/17/2015   Procedure: CATARACT EXTRACTION PHACO AND INTRAOCULAR LENS PLACEMENT (IOC);  Surgeon: Oneil Platts, MD;  Location: [facility];  Service: Ophthalmology;  Laterality: Right;  CDE:6.12   CATARACT EXTRACTION W/PHACO Left 05/31/2015   Procedure: CATARACT EXTRACTION PHACO AND INTRAOCULAR LENS PLACEMENT (IOC);  Surgeon: Oneil Platts, MD;  Location: [facility];  Service: Ophthalmology;  Laterality: Left;  CDE: 6.27   COLONOSCOPY  2017   HD-suprep(good)-tics/TA x 1   EYE SURGERY     FOOT SURGERY Bilateral    TOTAL KNEE ARTHROPLASTY  11/05/2011   Procedure: TOTAL KNEE ARTHROPLASTY;  Surgeon: Dempsey LULLA Moan, MD;  Location: [facility];  Service: Orthopedics;  Laterality: Left;   TOTAL KNEE ARTHROPLASTY Right 02/09/2013   Procedure: RIGHT TOTAL KNEE ARTHROPLASTY;  Surgeon: Dempsey LULLA Moan, MD;  Location: [facility];  Service: Orthopedics;  Laterality: Right;   TOTAL SHOULDER ARTHROPLASTY Right 07/24/2018   Procedure: RIGHT TOTAL SHOULDER ARTHROPLASTY;  Surgeon: Melita Drivers, MD;  Location: [facility];  Service: Orthopedics;  Laterality: Right;     reports that she quit smoking about 32 years ago. Her smoking use included cigarettes. She started smoking about 44 years ago. She has a 18 pack-year  smoking history. She has never used smokeless tobacco. She reports that she does not drink alcohol  and does not use drugs. family history includes Arthritis in an other family member; Colon polyps in her mother; Dementia in her mother; Diabetes in her brother; Heart disease in her brother, father, and another family member; Hypertension in her brother, father, and sister; Stroke in her father and another family member. Allergies  Allergen Reactions   Cefuroxime Rash   Cefoxitin Hives and Rash   Cefuroxime Axetil Hives and Rash     ceftin=hives      Review of Systems  Constitutional:  Negative for  malaise/fatigue and weight loss.  Eyes:  Negative for blurred vision.  Respiratory:  Negative for shortness of breath.   Cardiovascular:  Negative for chest pain.  Gastrointestinal:  Negative for abdominal pain.  Genitourinary:  Negative for dysuria.  Neurological:  Negative for dizziness, weakness and headaches.      Objective:     BP (!) 152/70 (BP Location: Left Arm, Patient Position: Sitting, Cuff Size: Large)   Pulse (!) 103   Temp 98.1 F (36.7 C) (Oral)   Ht 5' 3.39 (1.61 m)   Wt 244 lb 6.4 oz (110.9 kg)   SpO2 97%   BMI 42.77 kg/m  BP Readings from Last 3 Encounters:  09/11/23 (!) 152/70  08/29/22 136/60  05/11/22 (!) 144/70   Wt Readings from Last 3 Encounters:  09/11/23 244 lb 6.4 oz (110.9 kg)  08/29/22 241 lb 11.2 oz (109.6 kg)  05/11/22 239 lb 1.6 oz (108.5 kg)      Physical Exam Vitals reviewed.  Constitutional:      General: She is not in acute distress.    Appearance: She is well-developed.  Eyes:     Pupils: Pupils are equal, round, and reactive to light.  Neck:     Thyroid : No thyromegaly.     Vascular: No JVD.  Cardiovascular:     Rate and Rhythm: Normal rate and regular rhythm.     Heart sounds:     No gallop.  Pulmonary:     Effort: Pulmonary effort is normal. No respiratory distress.     Breath sounds: Normal breath sounds. No wheezing or rales.  Musculoskeletal:     Cervical back: Neck supple.     Right lower leg: No edema.     Left lower leg: No edema.  Neurological:     Mental Status: She is alert.      No results found for any visits on 09/11/23.    The 10-year ASCVD risk score (Arnett DK, et al., 2019) is: 20.1%    Assessment & Plan:   #1 physical exam.  We discussed several health maintenance issues as follows  -Vaccines up-to-date except for tetanus.  She will consider at pharmacy. -We discussed repeat mammogram and she will consider setting that up -May not get any further colonoscopies with age 22 -Strongly  encouraged to try to lose some weight  #2 history of prediabetes range blood sugars.  Last A1c 6.0%.  Stressed importance of weight loss.  Recheck A1c.  Patient specifically has questions regarding GLP-1 medications.  Get labs first.  We explained this may be difficult to get covered especially in the absence of diabetes.  #3 hypertension.  Generally well-controlled.  Up slightly today but she did not take her medications this morning.  Recommend close monitoring at home and be in touch if systolic readings consistently over 140.  Refill her blood pressure medications  for 1 year.  Check comprehensive metabolic panel   Return in about 6 months (around 03/10/2024).    Wolm Scarlet, MD

## 2023-09-14 MED ORDER — TIRZEPATIDE-WEIGHT MANAGEMENT 2.5 MG/0.5ML ~~LOC~~ SOLN: 2.5000 mg | SUBCUTANEOUS | 2 refills | Status: DC

## 2023-09-14 NOTE — Addendum Note (Signed)
 Addended by: Kristian Covey on: 09/14/2023 02:54 PM   Modules accepted: Orders

## 2023-09-16 ENCOUNTER — Other Ambulatory Visit: Payer: Self-pay | Admitting: Family Medicine

## 2023-09-16 MED ORDER — TIRZEPATIDE-WEIGHT MANAGEMENT 2.5 MG/0.5ML ~~LOC~~ SOLN: 2.5000 mg | SUBCUTANEOUS | 2 refills | Status: DC

## 2023-09-16 NOTE — Addendum Note (Signed)
 Addended by: Johnella Moloney on: 09/16/2023 09:03 AM   Modules accepted: Orders

## 2023-09-23 NOTE — Telephone Encounter (Signed)
Patient aware.

## 2024-03-10 ENCOUNTER — Encounter: Payer: Self-pay | Admitting: Family Medicine

## 2024-03-10 ENCOUNTER — Ambulatory Visit (INDEPENDENT_AMBULATORY_CARE_PROVIDER_SITE_OTHER): Payer: Medicare HMO | Admitting: Family Medicine

## 2024-03-10 VITALS — BP 132/60 | HR 88 | Temp 98.2°F | Wt 240.9 lb

## 2024-03-10 DIAGNOSIS — R7303 Prediabetes: Secondary | ICD-10-CM | POA: Diagnosis not present

## 2024-03-10 DIAGNOSIS — N1831 Chronic kidney disease, stage 3a: Secondary | ICD-10-CM

## 2024-03-10 DIAGNOSIS — I10 Essential (primary) hypertension: Secondary | ICD-10-CM

## 2024-03-10 LAB — POCT GLYCOSYLATED HEMOGLOBIN (HGB A1C): Hemoglobin A1C: 5.7 % — AB (ref 4.0–5.6)

## 2024-03-10 NOTE — Progress Notes (Signed)
 Established Patient Office Visit  Subjective   Patient ID: Carla Schmitt, female    DOB: October 12, 1945  Age: 78 y.o. MRN: [id number]  Chief Complaint  Patient presents with   Medical Management of Chronic Issues    HPI   [name] is seen for chronic medical management.  She has a history of hypertension, obesity, prediabetes, osteoarthritis.  Last A1c 6.1%.  She is struggled to lose weight.  She had expressed interest in GLP-1 medication we tried to write for Zepbound  but this was not covered by her insurance.  Her current medications include Diovan  HCTZ, Aldactone , and amlodipine .  Had recent lipids with total cholesterol 164, HDL of 69 and LDL 79.  Compliant with medications.  Denies any side effects.  Generally feels well.  No regular exercise.  No recent chest pains.  No peripheral edema.  Denies any recent falls.  Past Medical History:  Diagnosis Date   Anemia    SOME after HER KNEE SURGERY   Arthritis    generalized   Cataract    bilateral sx   Colon polyps    Diverticulosis    History of kidney stones    Hypertension    on meds   Irritable bowel    PONV (postoperative nausea and vomiting)    WITH 2ND KNEE SURGERY, HAD SOME N/V   Stiffness of joint, shoulder region    left   Tinnitus    both ears most of the time   Varicose veins    both legs   Past Surgical History:  Procedure Laterality Date   ABDOMINAL HYSTERECTOMY  02/09/2010   TAH   BREAST BIOPSY Left 2018   Fibroadenoma   CATARACT EXTRACTION W/PHACO Right 05/17/2015   Procedure: CATARACT EXTRACTION PHACO AND INTRAOCULAR LENS PLACEMENT (IOC);  Surgeon: Oneil Platts, MD;  Location: [facility];  Service: Ophthalmology;  Laterality: Right;  CDE:6.12   CATARACT EXTRACTION W/PHACO Left 05/31/2015   Procedure: CATARACT EXTRACTION PHACO AND INTRAOCULAR LENS PLACEMENT (IOC);  Surgeon: Oneil Platts, MD;  Location: [facility];  Service: Ophthalmology;  Laterality: Left;  CDE: 6.27   COLONOSCOPY  2017    HD-suprep(good)-tics/TA x 1   EYE SURGERY     FOOT SURGERY Bilateral    TOTAL KNEE ARTHROPLASTY  11/05/2011   Procedure: TOTAL KNEE ARTHROPLASTY;  Surgeon: Dempsey LULLA Moan, MD;  Location: [facility];  Service: Orthopedics;  Laterality: Left;   TOTAL KNEE ARTHROPLASTY Right 02/09/2013   Procedure: RIGHT TOTAL KNEE ARTHROPLASTY;  Surgeon: Dempsey LULLA Moan, MD;  Location: [facility];  Service: Orthopedics;  Laterality: Right;   TOTAL SHOULDER ARTHROPLASTY Right 07/24/2018   Procedure: RIGHT TOTAL SHOULDER ARTHROPLASTY;  Surgeon: Melita Drivers, MD;  Location: [facility];  Service: Orthopedics;  Laterality: Right;     reports that she quit smoking about 32 years ago. Her smoking use included cigarettes. She started smoking about 44 years ago. She has a 18 pack-year smoking history. She has never used smokeless tobacco. She reports that she does not drink alcohol  and does not use drugs. family history includes Arthritis in an other family member; Colon polyps in her mother; Dementia in her mother; Diabetes in her brother; Heart disease in her brother, father, and another family member; Hypertension in her brother, father, and sister; Stroke in her father and another family member. Allergies  Allergen Reactions   Cefuroxime Rash   Cefoxitin Hives and Rash   Cefuroxime Axetil Hives and Rash     ceftin=hives  Review of Systems  Constitutional:  Negative for malaise/fatigue.  Eyes:  Negative for blurred vision.  Respiratory:  Negative for shortness of breath.   Cardiovascular:  Negative for chest pain.  Gastrointestinal:  Negative for abdominal pain.  Genitourinary:  Negative for dysuria.  Neurological:  Negative for dizziness, weakness and headaches.      Objective:     BP 132/60 (BP Location: Left Arm, Patient Position: Sitting, Cuff Size: Large)   Pulse 88   Temp 98.2 F (36.8 C) (Oral)   Wt 240 lb 14.4 oz (109.3 kg)   SpO2 96%   BMI 42.16 kg/m  BP Readings from Last 3 Encounters:   03/10/24 132/60  09/11/23 (!) 152/70  08/29/22 136/60   Wt Readings from Last 3 Encounters:  03/10/24 240 lb 14.4 oz (109.3 kg)  09/11/23 244 lb 6.4 oz (110.9 kg)  08/29/22 241 lb 11.2 oz (109.6 kg)      Physical Exam Vitals reviewed.  Constitutional:      General: She is not in acute distress.    Appearance: She is not ill-appearing.  Cardiovascular:     Rate and Rhythm: Normal rate and regular rhythm.  Pulmonary:     Effort: Pulmonary effort is normal.     Breath sounds: Normal breath sounds. No wheezing or rales.  Musculoskeletal:     Right lower leg: No edema.     Left lower leg: No edema.  Neurological:     Mental Status: She is alert.      Results for orders placed or performed in visit on 03/10/24  POC HgB A1c  Result Value Ref Range   Hemoglobin A1C 5.7 (A) 4.0 - 5.6 %   HbA1c POC (<> result, manual entry)     HbA1c, POC (prediabetic range)     HbA1c, POC (controlled diabetic range)      Last CBC Lab Results  Component Value Date   WBC 6.0 08/01/2021   HGB 12.1 08/01/2021   HCT 37.2 08/01/2021   MCV 84.8 08/01/2021   MCH 27.8 07/08/2020   RDW 15.3 08/01/2021   PLT 298.0 08/01/2021   Last metabolic panel Lab Results  Component Value Date   GLUCOSE 95 09/11/2023   NA 138 09/11/2023   K 3.8 09/11/2023   CL 102 09/11/2023   CO2 28 09/11/2023   BUN 35 (H) 09/11/2023   CREATININE 1.11 09/11/2023   GFR 48.63 (L) 09/11/2023   CALCIUM 10.2 09/11/2023   PROT 7.8 09/11/2023   ALBUMIN 4.5 09/11/2023   BILITOT 0.4 09/11/2023   ALKPHOS 73 09/11/2023   AST 19 09/11/2023   ALT 10 09/11/2023   ANIONGAP 9 07/17/2018   Last lipids Lab Results  Component Value Date   CHOL 164 09/11/2023   HDL 69.70 09/11/2023   LDLCALC 79 09/11/2023   TRIG 74.0 09/11/2023   CHOLHDL 2 09/11/2023   Last hemoglobin A1c Lab Results  Component Value Date   HGBA1C 5.7 (A) 03/10/2024      The 10-year ASCVD risk score (Arnett DK, et al., 2019) is: 14.7%     Assessment & Plan:   #1 prediabetes stable with A1c today improved at 5.7%.  Continue lower glycemic diet.  We discussed some dietary recommendations with regard to this.  #2 hypertension stable and at goal.  Continue current regimen as above.  #3 chronic kidney disease.  Stressed importance of good blood pressure control with goal 130/80 or less.  Avoid nonsteroidals.  Stay well-hydrated.  Recheck labs including kidney  function at follow-up in 6 months  Return in about 6 months (around 09/10/2024).    Wolm Scarlet, MD

## 2024-03-10 NOTE — Patient Instructions (Signed)
Consider calorie tracking app such as Myfitnesspal  

## 2024-05-08 ENCOUNTER — Other Ambulatory Visit: Payer: Self-pay | Admitting: Family Medicine

## 2024-05-08 ENCOUNTER — Telehealth: Payer: Self-pay

## 2024-05-08 NOTE — Telephone Encounter (Signed)
 Copied from CRM #[id number]. Topic: Clinical - Prescription Issue >> May 08, 2024  1:54 PM Ivette P wrote: Reason for CRM: Pt called in about their medication amLODipine  (NORVASC ) 5 MG tablet and stated pharmacy reached out and stated they do not have it.   Pls follow up with pt.

## 2024-05-08 NOTE — Telephone Encounter (Signed)
 I spoke with Powell at [facility] and she stated they received the prescription and will contact patient when rx is ready for pickup.

## 2024-05-20 DIAGNOSIS — H4321 Crystalline deposits in vitreous body, right eye: Secondary | ICD-10-CM | POA: Diagnosis not present

## 2024-05-20 DIAGNOSIS — H43813 Vitreous degeneration, bilateral: Secondary | ICD-10-CM | POA: Diagnosis not present

## 2024-05-20 DIAGNOSIS — H5202 Hypermetropia, left eye: Secondary | ICD-10-CM | POA: Diagnosis not present

## 2024-05-20 DIAGNOSIS — H524 Presbyopia: Secondary | ICD-10-CM | POA: Diagnosis not present

## 2024-09-15 ENCOUNTER — Encounter: Payer: Self-pay | Admitting: Physician Assistant

## 2024-10-08 NOTE — Progress Notes (Unsigned)
 "  Chief Complaint: Discuss colonoscopy  HPI:    Carla Schmitt is a 79 year old African-American female, known to Dr. Legrand, with a past medical history as listed below including hypertension, irritable bowel syndrome and multiple others, who was referred to me by Micheal Wolm ORN, MD for discussion of a colonoscopy.    07/03/2016 office visit with Dr. Legrand for IBS.  At that time only bothering her once a month.  Discussed loose bowel movements during episodes.  That time prescribe Dicyclomine  as needed and scheduled for colonoscopy.    07/06/2021 colonoscopy done for personal history of adenomatous polyps on the last colonoscopy 5 years prior with 3 to-4 mm polyps in transverse colon and ascending colon, diverticulosis in the left colon and internal hemorrhoids.  Pathology showed tubular adenomas.  Repeat recommended in 3 years.  Past Medical History:  Diagnosis Date   Anemia    SOME after HER KNEE SURGERY   Arthritis    generalized   Cataract    bilateral sx   Colon polyps    Diverticulosis    History of kidney stones    Hypertension    on meds   Irritable bowel    PONV (postoperative nausea and vomiting)    WITH 2ND KNEE SURGERY, HAD SOME N/V   Stiffness of joint, shoulder region    left   Tinnitus    both ears most of the time   Varicose veins    both legs    Past Surgical History:  Procedure Laterality Date   ABDOMINAL HYSTERECTOMY  02/09/2010   TAH   BREAST BIOPSY Left 2018   Fibroadenoma   CATARACT EXTRACTION W/PHACO Right 05/17/2015   Procedure: CATARACT EXTRACTION PHACO AND INTRAOCULAR LENS PLACEMENT (IOC);  Surgeon: Oneil Platts, MD;  Location: [facility];  Service: Ophthalmology;  Laterality: Right;  CDE:6.12   CATARACT EXTRACTION W/PHACO Left 05/31/2015   Procedure: CATARACT EXTRACTION PHACO AND INTRAOCULAR LENS PLACEMENT (IOC);  Surgeon: Oneil Platts, MD;  Location: [facility];  Service: Ophthalmology;  Laterality: Left;  CDE: 6.27   COLONOSCOPY  2017    HD-suprep(good)-tics/TA x 1   EYE SURGERY     FOOT SURGERY Bilateral    TOTAL KNEE ARTHROPLASTY  11/05/2011   Procedure: TOTAL KNEE ARTHROPLASTY;  Surgeon: Dempsey LULLA Moan, MD;  Location: [facility];  Service: Orthopedics;  Laterality: Left;   TOTAL KNEE ARTHROPLASTY Right 02/09/2013   Procedure: RIGHT TOTAL KNEE ARTHROPLASTY;  Surgeon: Dempsey LULLA Moan, MD;  Location: [facility];  Service: Orthopedics;  Laterality: Right;   TOTAL SHOULDER ARTHROPLASTY Right 07/24/2018   Procedure: RIGHT TOTAL SHOULDER ARTHROPLASTY;  Surgeon: Melita Drivers, MD;  Location: [facility];  Service: Orthopedics;  Laterality: Right;     Current Outpatient Medications  Medication Sig Dispense Refill   amLODipine  (NORVASC ) 5 MG tablet TAKE 1 TABLET(5 MG) BY MOUTH DAILY 90 tablet 1   calcium-vitamin D (OSCAL WITH D) 250-125 MG-UNIT tablet Take 2 tablets by mouth 2 (two) times daily.     Multiple Vitamin (MULTIVITAMIN PO) Take 2 tablets by mouth daily at 6 (six) AM.     spironolactone  (ALDACTONE ) 25 MG tablet Take 1 tablet (25 mg total) by mouth daily. 90 tablet 3   triamcinolone  (KENALOG ) 0.025 % ointment Apply 1 application. topically 2 (two) times daily as needed. 45 g 2   valsartan -hydrochlorothiazide  (DIOVAN -HCT) 160-25 MG tablet Take 1 tablet by mouth daily. 90 tablet 3   No current facility-administered medications for this visit.    Allergies as  of 10/09/2024 - Review Complete 03/10/2024  Allergen Reaction Noted   Cefuroxime Rash 06/12/2021   Cefoxitin Hives and Rash 06/25/2018   Cefuroxime axetil Hives and Rash 06/04/2011    Family History  Problem Relation Age of Onset   Colon polyps Mother    Dementia Mother    Heart disease Father    Hypertension Father    Stroke Father    Hypertension Sister    Diabetes Brother    Heart disease Brother    Hypertension Brother    Arthritis Other    Heart disease Other    Stroke Other    Colon cancer Neg Hx    Esophageal cancer Neg Hx    Rectal cancer Neg Hx     Stomach cancer Neg Hx     Social History   Socioeconomic History   Marital status: Married    Spouse name: Not on file   Number of children: 2   Years of education: Not on file   Highest education level: 12th grade  Occupational History   Occupation: retired  Tobacco Use   Smoking status: Former    Current packs/day: 0.00    Average packs/day: 1.5 packs/day for 12.0 years (18.0 ttl pk-yrs)    Types: Cigarettes    Start date: 06/04/1979    Quit date: 06/04/1991    Years since quitting: 33.3   Smokeless tobacco: Never  Vaping Use   Vaping status: Never Used  Substance and Sexual Activity   Alcohol  use: No   Drug use: No   Sexual activity: Never    Birth control/protection: Surgical  Other Topics Concern   Not on file  Social History Narrative   Not on file   Social Drivers of Health   Tobacco Use: Medium Risk (03/10/2024)   Patient History    Smoking Tobacco Use: Former    Smokeless Tobacco Use: Never    Passive Exposure: Not on Actuary Strain: Low Risk (03/07/2024)   Overall Financial Resource Strain (CARDIA)    Difficulty of Paying Living Expenses: Not hard at all  Food Insecurity: Patient Declined (03/07/2024)   Epic    Worried About Programme Researcher, Broadcasting/film/video in the Last Year: Patient declined    Barista in the Last Year: Patient declined  Transportation Needs: No Transportation Needs (03/07/2024)   Epic    Lack of Transportation (Medical): No    Lack of Transportation (Non-Medical): No  Physical Activity: Not on file  Stress: No Stress Concern Present (03/07/2024)   [employer] of Occupational Health - Occupational Stress Questionnaire    Feeling of Stress: Not at all  Social Connections: Socially Integrated (03/07/2024)   Social Connection and Isolation Panel    Frequency of Communication with Friends and Family: More than three times a week    Frequency of Social Gatherings with Friends and Family: More than three times a week    Attends  Religious Services: More than 4 times per year    Active Member of [employer] or Organizations: Yes    Attends Banker Meetings: More than 4 times per year    Marital Status: Married  Catering Manager Violence: Not on file  Depression (PHQ2-9): Low Risk (09/11/2023)   Depression (PHQ2-9)    PHQ-2 Score: 0  Alcohol  Screen: Not on file  Housing: Unknown (03/07/2024)   Epic    Unable to Pay for Housing in the Last Year: Patient declined    Number of Times Moved  in the Last Year: Not on file    Homeless in the Last Year: No  Utilities: Not on file  Health Literacy: Not on file    Review of Systems:    Constitutional: No weight loss, fever, chills, weakness or fatigue HEENT: Eyes: No change in vision               Ears, Nose, Throat:  No change in hearing or congestion Skin: No rash or itching Cardiovascular: No chest pain, chest pressure or palpitations   Respiratory: No SOB or cough Gastrointestinal: See HPI and otherwise negative Genitourinary: No dysuria or change in urinary frequency Neurological: No headache, dizziness or syncope Musculoskeletal: No new muscle or joint pain Hematologic: No bleeding or bruising Psychiatric: No history of depression or anxiety    Physical Exam:  Vital signs: There were no vitals taken for this visit.  Constitutional:   Pleasant Caucasian female appears to be in NAD, Well developed, Well nourished, alert and cooperative Head:  Normocephalic and atraumatic. Eyes:   PEERL, EOMI. No icterus. Conjunctiva pink. Ears:  Normal auditory acuity. Neck:  Supple Throat: Oral cavity and pharynx without inflammation, swelling or lesion.  Respiratory: Respirations even and unlabored. Lungs clear to auscultation bilaterally.   No wheezes, crackles, or rhonchi.  Cardiovascular: Normal S1, S2. No MRG. Regular rate and rhythm. No peripheral edema, cyanosis or pallor.  Gastrointestinal:  Soft, nondistended, nontender. No rebound or guarding. Normal bowel  sounds. No appreciable masses or hepatomegaly. Rectal:  Not performed.  Msk:  Symmetrical without gross deformities. Without edema, no deformity or joint abnormality.  Neurologic:  Alert and  oriented x4;  grossly normal neurologically.  Skin:   Dry and intact without significant lesions or rashes. Psychiatric: Oriented to person, place and time. Demonstrates good judgement and reason without abnormal affect or behaviors.  RELEVANT LABS AND IMAGING: CBC    Component Value Date/Time   WBC 6.0 08/01/2021 1344   RBC 4.38 08/01/2021 1344   HGB 12.1 08/01/2021 1344   HCT 37.2 08/01/2021 1344   PLT 298.0 08/01/2021 1344   MCV 84.8 08/01/2021 1344   MCH 27.8 07/08/2020 1035   MCHC 32.6 08/01/2021 1344   RDW 15.3 08/01/2021 1344   LYMPHSABS 2.2 08/01/2021 1344   MONOABS 0.3 08/01/2021 1344   EOSABS 0.1 08/01/2021 1344   BASOSABS 0.0 08/01/2021 1344    CMP     Component Value Date/Time   NA 138 09/11/2023 1151   K 3.8 09/11/2023 1151   CL 102 09/11/2023 1151   CO2 28 09/11/2023 1151   GLUCOSE 95 09/11/2023 1151   BUN 35 (H) 09/11/2023 1151   CREATININE 1.11 09/11/2023 1151   CREATININE 1.05 (H) 07/08/2020 1035   CALCIUM 10.2 09/11/2023 1151   PROT 7.8 09/11/2023 1151   ALBUMIN 4.5 09/11/2023 1151   AST 19 09/11/2023 1151   ALT 10 09/11/2023 1151   ALKPHOS 73 09/11/2023 1151   BILITOT 0.4 09/11/2023 1151   GFRNONAA 59 (L) 07/17/2018 1359   GFRAA >60 07/17/2018 1359    Assessment: 1. ***  Plan: 1. ***     Carla Failing, PA-C [facility] 10/08/2024, 11:50 AM  Cc: Micheal Wolm ORN, MD  "

## 2024-10-09 ENCOUNTER — Ambulatory Visit: Admitting: Physician Assistant

## 2024-10-09 ENCOUNTER — Encounter: Payer: Self-pay | Admitting: Physician Assistant

## 2024-10-09 VITALS — BP 124/70 | HR 82 | Ht 63.39 in | Wt 241.1 lb

## 2024-10-09 DIAGNOSIS — K580 Irritable bowel syndrome with diarrhea: Secondary | ICD-10-CM

## 2024-10-09 DIAGNOSIS — Z860101 Personal history of adenomatous and serrated colon polyps: Secondary | ICD-10-CM

## 2024-10-09 MED ORDER — DICYCLOMINE HCL 10 MG PO CAPS: 10.0000 mg | ORAL_CAPSULE | Freq: Two times a day (BID) | ORAL | 1 refills | Status: AC | PRN

## 2024-10-09 MED ORDER — NA SULFATE-K SULFATE-MG SULF 17.5-3.13-1.6 GM/177ML PO SOLN: 1.0000 | Freq: Once | ORAL | 0 refills | Status: AC

## 2024-10-09 NOTE — Patient Instructions (Signed)
 We have sent the following medications to your pharmacy for you to pick up at your convenience: Dicyclomine   You have been scheduled for a colonoscopy. Please follow written instructions given to you at your visit today.   If you use inhalers (even only as needed), please bring them with you on the day of your procedure.  DO NOT TAKE 7 DAYS PRIOR TO TEST- Trulicity (dulaglutide) Ozempic, Wegovy (semaglutide) Mounjaro , Zepbound  (tirzepatide ) Bydureon Bcise (exanatide extended release)  DO NOT TAKE 1 DAY PRIOR TO YOUR TEST Rybelsus (semaglutide) Adlyxin (lixisenatide) Victoza (liraglutide) Byetta (exanatide) ___________________________________________________________________________

## 2024-11-26 ENCOUNTER — Encounter: Admitting: Gastroenterology
